# Patient Record
Sex: Male | Born: 1935
Health system: Southern US, Community
[De-identification: ages and names within clinical notes are randomized; demographics above are authoritative.]

## PROBLEM LIST (undated history)

## (undated) DIAGNOSIS — K219 Gastro-esophageal reflux disease without esophagitis: Secondary | ICD-10-CM

## (undated) DIAGNOSIS — E785 Hyperlipidemia, unspecified: Secondary | ICD-10-CM

## (undated) DIAGNOSIS — B029 Zoster without complications: Secondary | ICD-10-CM

## (undated) DIAGNOSIS — I639 Cerebral infarction, unspecified: Secondary | ICD-10-CM

## (undated) DIAGNOSIS — M1990 Unspecified osteoarthritis, unspecified site: Secondary | ICD-10-CM

## (undated) DIAGNOSIS — J449 Chronic obstructive pulmonary disease, unspecified: Secondary | ICD-10-CM

## (undated) DIAGNOSIS — E119 Type 2 diabetes mellitus without complications: Secondary | ICD-10-CM

## (undated) DIAGNOSIS — Z8719 Personal history of other diseases of the digestive system: Secondary | ICD-10-CM

## (undated) DIAGNOSIS — R0602 Shortness of breath: Secondary | ICD-10-CM

## (undated) DIAGNOSIS — I2510 Atherosclerotic heart disease of native coronary artery without angina pectoris: Secondary | ICD-10-CM

## (undated) DIAGNOSIS — D126 Benign neoplasm of colon, unspecified: Secondary | ICD-10-CM

## (undated) DIAGNOSIS — R51 Headache: Secondary | ICD-10-CM

## (undated) DIAGNOSIS — F419 Anxiety disorder, unspecified: Secondary | ICD-10-CM

## (undated) DIAGNOSIS — D472 Monoclonal gammopathy: Principal | ICD-10-CM

## (undated) DIAGNOSIS — I10 Essential (primary) hypertension: Secondary | ICD-10-CM

## (undated) DIAGNOSIS — G709 Myoneural disorder, unspecified: Secondary | ICD-10-CM

## (undated) DIAGNOSIS — I6529 Occlusion and stenosis of unspecified carotid artery: Secondary | ICD-10-CM

## (undated) DIAGNOSIS — Z87442 Personal history of urinary calculi: Secondary | ICD-10-CM

## (undated) DIAGNOSIS — C801 Malignant (primary) neoplasm, unspecified: Secondary | ICD-10-CM

## (undated) HISTORY — DX: Essential (primary) hypertension: I10

## (undated) HISTORY — DX: Unspecified osteoarthritis, unspecified site: M19.90

## (undated) HISTORY — DX: Personal history of urinary calculi: Z87.442

## (undated) HISTORY — DX: Occlusion and stenosis of unspecified carotid artery: I65.29

## (undated) HISTORY — DX: Type 2 diabetes mellitus without complications: E11.9

## (undated) HISTORY — DX: Cerebral infarction, unspecified: I63.9

## (undated) HISTORY — DX: Hyperlipidemia, unspecified: E78.5

## (undated) HISTORY — PX: CAROTID ENDARTERECTOMY: SUR193

## (undated) HISTORY — DX: Chronic obstructive pulmonary disease, unspecified: J44.9

## (undated) HISTORY — DX: Atherosclerotic heart disease of native coronary artery without angina pectoris: I25.10

## (undated) HISTORY — DX: Malignant (primary) neoplasm, unspecified: C80.1

## (undated) HISTORY — DX: Monoclonal gammopathy: D47.2

## (undated) HISTORY — DX: Zoster without complications: B02.9

## (undated) HISTORY — DX: Benign neoplasm of colon, unspecified: D12.6

## (undated) HISTORY — DX: Gastro-esophageal reflux disease without esophagitis: K21.9

---
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
## 1996-01-29 HISTORY — PX: CORONARY ARTERY BYPASS GRAFT: SHX141

## 2005-05-27 ENCOUNTER — Ambulatory Visit (HOSPITAL_COMMUNITY): Admission: RE | Admit: 2005-05-27 | Discharge: 2005-05-27 | Payer: Self-pay | Admitting: Neurosurgery

## 2005-11-21 ENCOUNTER — Encounter: Payer: Self-pay | Admitting: Gastroenterology

## 2006-12-21 ENCOUNTER — Emergency Department (HOSPITAL_COMMUNITY): Admission: EM | Admit: 2006-12-21 | Discharge: 2006-12-21 | Payer: Self-pay | Admitting: Emergency Medicine

## 2008-04-28 HISTORY — PX: OTHER SURGICAL HISTORY: SHX169

## 2008-04-28 HISTORY — PX: CORONARY ANGIOPLASTY: SHX604

## 2008-06-09 ENCOUNTER — Ambulatory Visit: Payer: Self-pay | Admitting: *Deleted

## 2008-06-10 ENCOUNTER — Encounter: Payer: Self-pay | Admitting: Pulmonary Disease

## 2008-06-14 ENCOUNTER — Ambulatory Visit (HOSPITAL_COMMUNITY): Admission: RE | Admit: 2008-06-14 | Discharge: 2008-06-14 | Payer: Self-pay | Admitting: Cardiovascular Disease

## 2008-06-20 ENCOUNTER — Ambulatory Visit: Payer: Self-pay | Admitting: *Deleted

## 2008-06-20 ENCOUNTER — Ambulatory Visit (HOSPITAL_COMMUNITY): Admission: RE | Admit: 2008-06-20 | Discharge: 2008-06-20 | Payer: Self-pay | Admitting: *Deleted

## 2008-06-29 ENCOUNTER — Encounter: Payer: Self-pay | Admitting: Internal Medicine

## 2008-07-07 ENCOUNTER — Ambulatory Visit: Payer: Self-pay | Admitting: *Deleted

## 2008-07-11 ENCOUNTER — Encounter: Payer: Self-pay | Admitting: Pulmonary Disease

## 2008-09-06 ENCOUNTER — Ambulatory Visit: Payer: Self-pay | Admitting: Internal Medicine

## 2008-09-06 DIAGNOSIS — Z860100 Personal history of colon polyps, unspecified: Secondary | ICD-10-CM | POA: Insufficient documentation

## 2008-09-06 DIAGNOSIS — J449 Chronic obstructive pulmonary disease, unspecified: Secondary | ICD-10-CM | POA: Insufficient documentation

## 2008-09-06 DIAGNOSIS — Z87442 Personal history of urinary calculi: Secondary | ICD-10-CM | POA: Insufficient documentation

## 2008-09-06 DIAGNOSIS — K219 Gastro-esophageal reflux disease without esophagitis: Secondary | ICD-10-CM | POA: Insufficient documentation

## 2008-09-06 DIAGNOSIS — M1990 Unspecified osteoarthritis, unspecified site: Secondary | ICD-10-CM | POA: Insufficient documentation

## 2008-09-12 ENCOUNTER — Encounter: Payer: Self-pay | Admitting: Pulmonary Disease

## 2008-10-10 ENCOUNTER — Encounter: Payer: Self-pay | Admitting: Pulmonary Disease

## 2008-10-10 ENCOUNTER — Encounter: Payer: Self-pay | Admitting: Internal Medicine

## 2008-11-02 ENCOUNTER — Encounter: Payer: Self-pay | Admitting: Internal Medicine

## 2009-01-05 ENCOUNTER — Ambulatory Visit: Payer: Self-pay | Admitting: Internal Medicine

## 2009-01-06 ENCOUNTER — Other Ambulatory Visit: Admission: RE | Admit: 2009-01-06 | Discharge: 2009-01-06 | Payer: Self-pay | Admitting: Otolaryngology

## 2009-01-13 ENCOUNTER — Ambulatory Visit: Payer: Self-pay | Admitting: Internal Medicine

## 2009-01-16 ENCOUNTER — Encounter (INDEPENDENT_AMBULATORY_CARE_PROVIDER_SITE_OTHER): Payer: Self-pay | Admitting: *Deleted

## 2009-01-23 ENCOUNTER — Telehealth: Payer: Self-pay | Admitting: Internal Medicine

## 2009-01-30 ENCOUNTER — Ambulatory Visit: Payer: Self-pay | Admitting: Cardiology

## 2009-01-30 DIAGNOSIS — I6529 Occlusion and stenosis of unspecified carotid artery: Secondary | ICD-10-CM | POA: Insufficient documentation

## 2009-02-01 ENCOUNTER — Ambulatory Visit: Payer: Self-pay | Admitting: Gastroenterology

## 2009-02-01 ENCOUNTER — Encounter (INDEPENDENT_AMBULATORY_CARE_PROVIDER_SITE_OTHER): Payer: Self-pay | Admitting: *Deleted

## 2009-02-01 DIAGNOSIS — K5900 Constipation, unspecified: Secondary | ICD-10-CM | POA: Insufficient documentation

## 2009-02-08 ENCOUNTER — Ambulatory Visit: Payer: Self-pay | Admitting: Vascular Surgery

## 2009-02-10 ENCOUNTER — Ambulatory Visit: Payer: Self-pay | Admitting: Cardiology

## 2009-02-10 LAB — CONVERTED CEMR LAB
ALT: 27 units/L (ref 0–53)
BUN: 22 mg/dL (ref 6–23)
Bilirubin, Direct: 0.2 mg/dL (ref 0.0–0.3)
Chloride: 102 meq/L (ref 96–112)
Creatinine, Ser: 1.02 mg/dL (ref 0.40–1.50)
Indirect Bilirubin: 0.6 mg/dL (ref 0.0–0.9)
Total Bilirubin: 0.8 mg/dL (ref 0.3–1.2)

## 2009-02-14 LAB — CONVERTED CEMR LAB
BUN: 23 mg/dL (ref 6–23)
Chloride: 100 meq/L (ref 96–112)
Glucose, Bld: 89 mg/dL (ref 70–99)
Phosphorus: 4.1 mg/dL (ref 2.3–4.6)
Potassium: 4.3 meq/L (ref 3.5–5.3)
Sodium: 140 meq/L (ref 135–145)

## 2009-02-15 ENCOUNTER — Ambulatory Visit: Payer: Self-pay | Admitting: Cardiology

## 2009-02-15 ENCOUNTER — Ambulatory Visit: Payer: Self-pay | Admitting: Pulmonary Disease

## 2009-02-17 LAB — CONVERTED CEMR LAB
Cholesterol: 171 mg/dL (ref 0–200)
LDL Cholesterol: 98 mg/dL (ref 0–99)
Triglycerides: 116 mg/dL (ref 0.0–149.0)

## 2009-02-20 LAB — CONVERTED CEMR LAB: TSH: 0.96 microintl units/mL (ref 0.35–5.50)

## 2009-02-22 LAB — CONVERTED CEMR LAB
BUN: 13 mg/dL (ref 6–23)
Chloride: 106 meq/L (ref 96–112)
Creatinine, Ser: 1.1 mg/dL (ref 0.4–1.5)
Glucose, Bld: 106 mg/dL — ABNORMAL HIGH (ref 70–99)
Potassium: 3.8 meq/L (ref 3.5–5.1)

## 2009-03-03 ENCOUNTER — Telehealth: Payer: Self-pay | Admitting: Internal Medicine

## 2009-03-17 ENCOUNTER — Ambulatory Visit: Payer: Self-pay | Admitting: Internal Medicine

## 2009-03-17 ENCOUNTER — Telehealth: Payer: Self-pay | Admitting: Internal Medicine

## 2009-04-06 ENCOUNTER — Ambulatory Visit: Payer: Self-pay | Admitting: Gastroenterology

## 2009-04-06 LAB — HM COLONOSCOPY

## 2009-04-07 ENCOUNTER — Emergency Department (HOSPITAL_COMMUNITY): Admission: EM | Admit: 2009-04-07 | Discharge: 2009-04-07 | Payer: Self-pay | Admitting: Emergency Medicine

## 2009-04-07 ENCOUNTER — Telehealth: Payer: Self-pay | Admitting: Endocrinology

## 2009-04-10 ENCOUNTER — Ambulatory Visit: Payer: Self-pay | Admitting: Internal Medicine

## 2009-04-10 LAB — CONVERTED CEMR LAB
Bilirubin Urine: NEGATIVE
Blood in Urine, dipstick: NEGATIVE
Nitrite: NEGATIVE
Specific Gravity, Urine: 1.02
WBC Urine, dipstick: NEGATIVE

## 2009-04-11 ENCOUNTER — Encounter: Payer: Self-pay | Admitting: Gastroenterology

## 2009-04-11 ENCOUNTER — Encounter: Payer: Self-pay | Admitting: Internal Medicine

## 2009-04-12 ENCOUNTER — Telehealth: Payer: Self-pay | Admitting: Internal Medicine

## 2009-04-13 ENCOUNTER — Ambulatory Visit: Payer: Self-pay | Admitting: Internal Medicine

## 2009-04-13 DIAGNOSIS — M549 Dorsalgia, unspecified: Secondary | ICD-10-CM | POA: Insufficient documentation

## 2009-04-14 ENCOUNTER — Telehealth: Payer: Self-pay | Admitting: Internal Medicine

## 2009-04-27 ENCOUNTER — Ambulatory Visit: Payer: Self-pay | Admitting: Internal Medicine

## 2009-04-27 DIAGNOSIS — B0229 Other postherpetic nervous system involvement: Secondary | ICD-10-CM | POA: Insufficient documentation

## 2009-05-31 ENCOUNTER — Ambulatory Visit: Payer: Self-pay | Admitting: Internal Medicine

## 2009-05-31 DIAGNOSIS — N138 Other obstructive and reflux uropathy: Secondary | ICD-10-CM | POA: Insufficient documentation

## 2009-05-31 DIAGNOSIS — N401 Enlarged prostate with lower urinary tract symptoms: Secondary | ICD-10-CM | POA: Insufficient documentation

## 2009-05-31 LAB — CONVERTED CEMR LAB
BUN: 13 mg/dL (ref 6–23)
Basophils Absolute: 0.1 10*3/uL (ref 0.0–0.1)
Basophils Relative: 0.6 % (ref 0.0–3.0)
Bilirubin Urine: NEGATIVE
CO2: 28 meq/L (ref 19–32)
Calcium: 10.3 mg/dL (ref 8.4–10.5)
Chloride: 107 meq/L (ref 96–112)
Creatinine, Ser: 1 mg/dL (ref 0.4–1.5)
Eosinophils Absolute: 0.3 10*3/uL (ref 0.0–0.7)
Glucose, Bld: 112 mg/dL — ABNORMAL HIGH (ref 70–99)
Hemoglobin, Urine: NEGATIVE
Lymphocytes Relative: 25.1 % (ref 12.0–46.0)
MCHC: 34.7 g/dL (ref 30.0–36.0)
MCV: 93.1 fL (ref 78.0–100.0)
Monocytes Absolute: 0.8 10*3/uL (ref 0.1–1.0)
Neutrophils Relative %: 62.4 % (ref 43.0–77.0)
Nitrite: NEGATIVE
Platelets: 228 10*3/uL (ref 150.0–400.0)
RBC: 4.52 M/uL (ref 4.22–5.81)
RDW: 14.4 % (ref 11.5–14.6)
Urine Glucose: NEGATIVE mg/dL
Urobilinogen, UA: 0.2 (ref 0.0–1.0)

## 2009-06-01 ENCOUNTER — Encounter: Payer: Self-pay | Admitting: Internal Medicine

## 2009-06-06 ENCOUNTER — Ambulatory Visit: Payer: Self-pay | Admitting: Cardiology

## 2009-06-06 DIAGNOSIS — E785 Hyperlipidemia, unspecified: Secondary | ICD-10-CM | POA: Insufficient documentation

## 2009-06-27 ENCOUNTER — Ambulatory Visit (HOSPITAL_COMMUNITY): Admission: RE | Admit: 2009-06-27 | Discharge: 2009-06-27 | Payer: Self-pay | Admitting: Cardiology

## 2009-06-27 ENCOUNTER — Ambulatory Visit: Payer: Self-pay

## 2009-06-27 ENCOUNTER — Ambulatory Visit: Payer: Self-pay | Admitting: Cardiology

## 2009-06-27 ENCOUNTER — Encounter: Payer: Self-pay | Admitting: Cardiology

## 2009-07-03 ENCOUNTER — Telehealth: Payer: Self-pay | Admitting: Cardiology

## 2009-07-03 LAB — CONVERTED CEMR LAB
Albumin: 4.1 g/dL (ref 3.5–5.2)
Cholesterol: 150 mg/dL (ref 0–200)
LDL Cholesterol: 82 mg/dL (ref 0–99)
Total CHOL/HDL Ratio: 3
Total Protein: 7.6 g/dL (ref 6.0–8.3)
Triglycerides: 76 mg/dL (ref 0.0–149.0)
VLDL: 15.2 mg/dL (ref 0.0–40.0)

## 2009-07-05 ENCOUNTER — Ambulatory Visit: Payer: Self-pay | Admitting: Internal Medicine

## 2009-07-05 DIAGNOSIS — I10 Essential (primary) hypertension: Secondary | ICD-10-CM | POA: Insufficient documentation

## 2009-07-05 LAB — CONVERTED CEMR LAB
HDL goal, serum: 40 mg/dL
LDL Goal: 100 mg/dL

## 2009-07-14 ENCOUNTER — Telehealth: Payer: Self-pay | Admitting: Cardiology

## 2009-08-30 ENCOUNTER — Telehealth: Payer: Self-pay | Admitting: Internal Medicine

## 2009-09-06 ENCOUNTER — Ambulatory Visit: Payer: Self-pay | Admitting: Internal Medicine

## 2009-09-11 ENCOUNTER — Ambulatory Visit: Payer: Self-pay | Admitting: Cardiology

## 2009-09-11 ENCOUNTER — Telehealth: Payer: Self-pay | Admitting: Internal Medicine

## 2009-09-14 ENCOUNTER — Ambulatory Visit: Payer: Self-pay | Admitting: Vascular Surgery

## 2009-09-14 ENCOUNTER — Encounter: Payer: Self-pay | Admitting: Internal Medicine

## 2009-09-18 LAB — CONVERTED CEMR LAB
ALT: 27 units/L (ref 0–53)
AST: 24 units/L (ref 0–37)
Alkaline Phosphatase: 52 units/L (ref 39–117)
Bilirubin, Direct: 0.1 mg/dL (ref 0.0–0.3)
Cholesterol: 123 mg/dL (ref 0–200)
Total Bilirubin: 0.8 mg/dL (ref 0.3–1.2)

## 2009-11-02 ENCOUNTER — Ambulatory Visit: Payer: Self-pay | Admitting: Internal Medicine

## 2010-01-04 ENCOUNTER — Encounter: Payer: Self-pay | Admitting: Internal Medicine

## 2010-01-04 ENCOUNTER — Ambulatory Visit: Payer: Self-pay | Admitting: Internal Medicine

## 2010-01-04 DIAGNOSIS — R220 Localized swelling, mass and lump, head: Secondary | ICD-10-CM | POA: Insufficient documentation

## 2010-01-04 DIAGNOSIS — R221 Localized swelling, mass and lump, neck: Secondary | ICD-10-CM

## 2010-01-04 DIAGNOSIS — M542 Cervicalgia: Secondary | ICD-10-CM | POA: Insufficient documentation

## 2010-01-04 LAB — CONVERTED CEMR LAB
Basophils Relative: 0.7 % (ref 0.0–3.0)
Chloride: 106 meq/L (ref 96–112)
Creatinine, Ser: 1 mg/dL (ref 0.4–1.5)
Eosinophils Relative: 4.2 % (ref 0.0–5.0)
HCT: 43.9 % (ref 39.0–52.0)
Hemoglobin: 15.1 g/dL (ref 13.0–17.0)
LDL Cholesterol: 93 mg/dL (ref 0–99)
MCV: 91.7 fL (ref 78.0–100.0)
Monocytes Absolute: 0.9 10*3/uL (ref 0.1–1.0)
Neutrophils Relative %: 48.9 % (ref 43.0–77.0)
Potassium: 4.2 meq/L (ref 3.5–5.1)
RBC: 4.79 M/uL (ref 4.22–5.81)
Sodium: 140 meq/L (ref 135–145)
Total CHOL/HDL Ratio: 3
WBC: 8.4 10*3/uL (ref 4.5–10.5)

## 2010-01-12 ENCOUNTER — Ambulatory Visit: Payer: Self-pay | Admitting: Cardiology

## 2010-01-18 ENCOUNTER — Encounter: Payer: Self-pay | Admitting: Cardiovascular Disease

## 2010-01-23 ENCOUNTER — Ambulatory Visit: Payer: Self-pay

## 2010-01-23 ENCOUNTER — Encounter: Payer: Self-pay | Admitting: Cardiovascular Disease

## 2010-01-23 DIAGNOSIS — I739 Peripheral vascular disease, unspecified: Secondary | ICD-10-CM | POA: Insufficient documentation

## 2010-02-14 ENCOUNTER — Telehealth: Payer: Self-pay | Admitting: Internal Medicine

## 2010-02-27 NOTE — Progress Notes (Signed)
Summary: FYI  Phone Note Outgoing Call   Summary of Call: I spoke with pt per MD and suggested he went to the hospital if he thinks he is having possible kidney stones. Pt informed me he has had kidney stones before and was going to go straight to the hospital. Initial call taken by: Josph Macho RMA,  April 07, 2009 9:21 AM

## 2010-02-27 NOTE — Assessment & Plan Note (Signed)
Summary: 2 MO FU/PN   Vital Signs:  Patient profile:   77 year old male Height:      72 inches Weight:      217 pounds BMI:     29.54 O2 Sat:      95 % on Room air Temp:     98.0 degrees F oral Pulse rate:   67 / minute Pulse rhythm:   regular Resp:     16 per minute BP sitting:   126 / 77  (left arm) Cuff size:   large  Vitals Entered By: [name] CMA (September 06, 2009 1:16 PM)  Nutrition Counseling: Patient's BMI is greater than 25 and therefore counseled on weight management options.  O2 Flow:  Room air CC: follow-up visit, Lipid Management Is Patient Diabetic? No Pain Assessment Patient in pain? no       Does patient need assistance? Functional Status Self care Ambulation Normal   Primary Care Marialice Newkirk:  [name] MD  CC:  follow-up visit and Lipid Management.  History of Present Illness:  Follow-Up Visit      This is a 77 year old man who presents for Follow-up visit.  The patient denies chest pain, palpitations, dizziness, syncope, edema, SOB, DOE, PND, and orthopnea.  Since the last visit the patient notes no new problems or concerns.  The patient reports taking meds as prescribed, monitoring BP, and dietary compliance.  When questioned about possible medication side effects, the patient notes none.    Lipid Management History:      Positive NCEP/ATP III risk factors include male age 28 years old or older, hypertension, and ASHD (either angina/prior MI/prior CABG).  Negative NCEP/ATP III risk factors include non-diabetic, no family history for ischemic heart disease, non-tobacco-user status, no prior stroke/TIA, no peripheral vascular disease, and no history of aortic aneurysm.        The patient states that he knows about the "Therapeutic Lifestyle Change" diet.  His compliance with the TLC diet is good.  He expresses no side effects from his lipid-lowering medication.  The patient denies any symptoms to suggest myopathy or liver disease.     Preventive Screening-Counseling & Management  Alcohol-Tobacco     Alcohol drinks/day: 0     Smoking Status: quit     Year Started: 1956     Year Quit: 1995     Pack years: 40     Tobacco Counseling: to remain off tobacco products  Hep-HIV-STD-Contraception     Hepatitis Risk: no risk noted     HIV Risk: no risk noted     STD Risk: no risk noted      Sexual History:  not active.        Drug Use:  never.        Blood Transfusions:  no.    Clinical Review Panels:  Lipid Management   Cholesterol:  150 (06/27/2009)   LDL (bad choesterol):  82 (06/27/2009)   HDL (good cholesterol):  53.30 (06/27/2009)  Diabetes Management   Creatinine:  1.0 (05/31/2009)   Last Flu Vaccine:  given (09/28/2008)   Last Pneumovax:  given (09/28/2008)  CBC   WBC:  9.5 (05/31/2009)   RBC:  4.52 (05/31/2009)   Hgb:  14.6 (05/31/2009)   Hct:  42.1 (05/31/2009)   Platelets:  228.0 (05/31/2009)   MCV  93.1 (05/31/2009)   MCHC  34.7 (05/31/2009)   RDW  14.4 (05/31/2009)   PMN:  62.4 (05/31/2009)   Lymphs:  25.1 (  05/31/2009)   Monos:  8.8 (05/31/2009)   Eosinophils:  3.1 (05/31/2009)   Basophil:  0.6 (05/31/2009)  Complete Metabolic Panel   Glucose:  112 (05/31/2009)   Sodium:  141 (05/31/2009)   Potassium:  3.9 (05/31/2009)   Chloride:  107 (05/31/2009)   CO2:  28 (05/31/2009)   BUN:  13 (05/31/2009)   Creatinine:  1.0 (05/31/2009)   Albumin:  4.1 (06/27/2009)   Total Protein:  7.6 (06/27/2009)   Calcium:  10.3 (05/31/2009)   Total Bili:  0.4 (06/27/2009)   Alk Phos:  47 (06/27/2009)   SGPT (ALT):  22 (06/27/2009)   SGOT (AST):  20 (06/27/2009)   Medications Prior to Update: 1)  Crestor 20 Mg Tabs (Rosuvastatin Calcium) .... One Daily 2)  Zolpidem Tartrate 10 Mg Tabs (Zolpidem Tartrate) .Marland Kitchen.. 1 At Bedtime As Needed 3)  Nasonex 50 Mcg/act Susp (Mometasone Furoate) .Marland Kitchen.. 1 To 2 Sprays Each Nostril Daily 4)  Ecotrin Low Strength 81 Mg Tbec (Aspirin) .... Take 3 Tablet By Mouth Once  A Day 5)  Acid Reducer 75 Mg Tabs (Ranitidine Hcl) .Marland Kitchen.. 1 Once Daily 6)  Advair Diskus 250-50 Mcg/dose Aepb (Fluticasone-Salmeterol) .... One Puff Two Times A Day 7)  Sinus Rinse Bottle Kit  Pack (Hypertonic Nasal Wash) .... As Needed 8)  Tramadol-Acetaminophen 37.5-325 Mg Tabs (Tramadol-Acetaminophen) .... As Needed 9)  Bystolic 5 Mg Tabs (Nebivolol Hcl) .... One By Mouth Once Daily For High Blood Pressure 10)  Lyrica 75 Mg Caps (Pregabalin) .... One By Mouth Three Times A Day As Needed For Nerve Pain  Current Medications (verified): 1)  Crestor 20 Mg Tabs (Rosuvastatin Calcium) .... One Daily 2)  Zolpidem Tartrate 10 Mg Tabs (Zolpidem Tartrate) .Marland Kitchen.. 1 At Bedtime As Needed 3)  Nasonex 50 Mcg/act Susp (Mometasone Furoate) .Marland Kitchen.. 1 To 2 Sprays Each Nostril Daily 4)  Ecotrin Low Strength 81 Mg Tbec (Aspirin) .... Take 3 Tablet By Mouth Once A Day 5)  Acid Reducer 75 Mg Tabs (Ranitidine Hcl) .Marland Kitchen.. 1 Once Daily 6)  Advair Diskus 250-50 Mcg/dose Aepb (Fluticasone-Salmeterol) .... One Puff Two Times A Day 7)  Sinus Rinse Bottle Kit  Pack (Hypertonic Nasal Wash) .... As Needed 8)  Tramadol-Acetaminophen 37.5-325 Mg Tabs (Tramadol-Acetaminophen) .... As Needed 9)  Bystolic 5 Mg Tabs (Nebivolol Hcl) .... One By Mouth Once Daily For High Blood Pressure 10)  Lyrica 75 Mg Caps (Pregabalin) .... One By Mouth Three Times A Day As Needed For Nerve Pain  Allergies (verified): No Known Drug Allergies  Past History:  Past Medical History: Last updated: 06/06/2009 1. CAD, ARTERY BYPASS GRAFT/ 1988 (ICD-414.04): s/p CABG in [facility] in 1989.  Had myoview in 5/10 showing EF 50% and apical ischemia.  LHC was done in 5/10 showing 90% pLAD, 90% mLAD, 60% mCFX, 70% pRCA, 70% mRCA, 95% dRCA, SVG-PLV patent, LIMA-LAD patent, no other grafts found by aortic root shot.  Free radial-D presumed occluded.  2. COPD (ICD-496): PFTs 9/10 with FVC 65%, FEV1 57%.  On Advair.  3. OSTEOARTHRITIS (ICD-715.90) 4.  NEPHROLITHIASIS, HX OF (ICD-V13.01) 5. GERD (ICD-530.81) 6. Tubulovillous adenomatous colon polyps with HGD 1999 7. Hyperlipidemia 8. Carotid stenosis: Followed by Dr. Madilyn Fireman.  Angiogram 5/10 showed left common carotid to be totally occluded and 50% RICA stenosis.  9.  HTN: ACEI cough 10.  CVA  11.  Shingles    Past Surgical History: Last updated: 02/01/2009 Coronary artery bypass graft 1989 Tonsillectomy 1941 cardiac catheterization April 2010 carotid artery angiogram April 2010  Family  History: Last updated: 02/01/2009 father died of a self-inflicted wound age 37, history of EtOH mother died age 47 history of EtOH One sister is well No FH of Colon Cancer:  Social History: Last updated: 02/01/2009 Retired Single former [employer] in [place] Regular exercise-yes Alcohol use-no Quit smoking around 20 years ago.  Alcohol Use - no Daily Caffeine Use: 2-3 cups weekly   Risk Factors: Alcohol Use: 0 (09/06/2009) Exercise: yes (09/06/2008)  Risk Factors: Smoking Status: quit (09/06/2009)  Family History: Reviewed history from 02/01/2009 and no changes required. father died of a self-inflicted wound age 48, history of EtOH mother died age 63 history of EtOH One sister is well No FH of Colon Cancer:  Social History: Reviewed history from 02/01/2009 and no changes required. 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  Alcohol Use - no Daily Caffeine Use: 2-3 cups weekly   Review of Systems  The patient denies anorexia, fever, weight loss, weight gain, chest pain, peripheral edema, prolonged cough, hemoptysis, abdominal pain, suspicious skin lesions, difficulty walking, depression, enlarged lymph nodes, and angioedema.    Physical Exam  General:  alert, well-developed, well-nourished, well-hydrated, appropriate dress, normal appearance,  healthy-appearing, cooperative to examination, good hygiene, and overweight-appearing.   Head:  normocephalic and atraumatic.   Mouth:  Oral mucosa and oropharynx without lesions or exudates.  Teeth in good repair. Neck:  supple, full ROM, no masses, no thyromegaly, no JVD, normal carotid upstroke, no carotid bruits, and no cervical lymphadenopathy.   Lungs:  normal respiratory effort, no intercostal retractions, no accessory muscle use, normal breath sounds, no dullness, no fremitus, and no crackles.   Heart:  normal rate, regular rhythm, no murmur, no gallop, no rub, and no JVD.   Abdomen:  soft, non-tender, normal bowel sounds, no distention, no masses, no guarding, no rigidity, and no rebound tenderness.   Msk:  normal ROM, no joint tenderness, no joint swelling, no joint warmth, no redness over joints, no joint deformities, no joint instability, no crepitation, and no muscle atrophy.   Pulses:  R and L carotid,radial,femoral,dorsalis pedis and posterior tibial pulses are full and equal bilaterally Extremities:  No clubbing, cyanosis, edema, or deformity noted with normal full range of motion of all joints.   Neurologic:  No cranial nerve deficits noted. Station and gait are normal. Plantar reflexes are down-going bilaterally. DTRs are symmetrical throughout. Sensory, motor and coordinative functions appear intact. Skin:  turgor normal, color normal, no rashes, no suspicious lesions, no ecchymoses, no petechiae, no purpura, no ulcerations, and no edema.   Cervical Nodes:  no anterior cervical adenopathy and no posterior cervical adenopathy.   Psych:  Cognition and judgment appear intact. Alert and cooperative with normal attention span and concentration. No apparent delusions, illusions, hallucinations   Impression & Recommendations:  Problem # 1:  HYPERTENSION, BENIGN ESSENTIAL (ICD-401.1) Assessment Improved  His updated medication list for this problem includes:    Bystolic 5 Mg Tabs  (Nebivolol hcl) ..... One by mouth once daily for high blood pressure  BP today: 126/77 Prior BP: 152/82 (07/05/2009)  Prior 10 Yr Risk Heart Disease: N/A (07/05/2009)  Labs Reviewed: K+: 3.9 (05/31/2009) Creat: : 1.0 (05/31/2009)   Chol: 150 (06/27/2009)   HDL: 53.30 (06/27/2009)   LDL: 82 (06/27/2009)   TG: 76.0 (06/27/2009)  Problem # 2:  HYPERLIPIDEMIA-MIXED (ICD-272.4) Assessment: Unchanged  His updated medication list for this problem  includes:    Crestor 20 Mg Tabs (Rosuvastatin calcium) ..... One daily  Labs Reviewed: SGOT: 20 (06/27/2009)   SGPT: 22 (06/27/2009)  Lipid Goals: Chol Goal: 200 (07/05/2009)   HDL Goal: 40 (07/05/2009)   LDL Goal: 100 (07/05/2009)   TG Goal: 150 (07/05/2009)  Prior 10 Yr Risk Heart Disease: N/A (07/05/2009)   HDL:53.30 (06/27/2009), 49.80 (02/10/2009)  LDL:82 (06/27/2009), 98 (02/10/2009)  Chol:150 (06/27/2009), 171 (02/10/2009)  Trig:76.0 (06/27/2009), 116.0 (02/10/2009)  Problem # 3:  POSTHERPETIC NEURALGIA (ICD-053.19) Assessment: Improved  Problem # 4:  COPD (ICD-496) Assessment: Unchanged  His updated medication list for this problem includes:    Advair Diskus 250-50 Mcg/dose Aepb (Fluticasone-salmeterol) ..... One puff two times a day  Pulmonary Functions Reviewed: O2 sat: 95 (09/06/2009)     Vaccines Reviewed: Pneumovax: given (09/28/2008)   Flu Vax: given (09/28/2008)  Complete Medication List: 1)  Crestor 20 Mg Tabs (Rosuvastatin calcium) .... One daily 2)  Zolpidem Tartrate 10 Mg Tabs (Zolpidem tartrate) .Marland Kitchen.. 1 at bedtime as needed 3)  Nasonex 50 Mcg/act Susp (Mometasone furoate) .Marland Kitchen.. 1 to 2 sprays each nostril daily 4)  Ecotrin Low Strength 81 Mg Tbec (Aspirin) .... Take 3 tablet by mouth once a day 5)  Acid Reducer 75 Mg Tabs (Ranitidine hcl) .Marland Kitchen.. 1 once daily 6)  Advair Diskus 250-50 Mcg/dose Aepb (Fluticasone-salmeterol) .... One puff two times a day 7)  Sinus Rinse Bottle Kit Pack (Hypertonic nasal wash) .... As  needed 8)  Tramadol-acetaminophen 37.5-325 Mg Tabs (Tramadol-acetaminophen) .... As needed 9)  Bystolic 5 Mg Tabs (Nebivolol hcl) .... One by mouth once daily for high blood pressure 10)  Lyrica 75 Mg Caps (Pregabalin) .... One by mouth three times a day as needed for nerve pain  Lipid Assessment/Plan:      Based on NCEP/ATP III, the patient's risk factor category is "history of coronary disease, peripheral vascular disease, cerebrovascular disease, or aortic aneurysm".  The patient's lipid goals are as follows: Total cholesterol goal is 200; LDL cholesterol goal is 100; HDL cholesterol goal is 40; Triglyceride goal is 150.     Patient Instructions: 1)  Please schedule a follow-up appointment in 6 months. 2)  It is important that you exercise regularly at least 20 minutes 5 times a week. If you develop chest pain, have severe difficulty breathing, or feel very tired , stop exercising immediately and seek medical attention. 3)  You need to lose weight. Consider a lower calorie diet and regular exercise.  4)  Check your Blood Pressure regularly. If it is above 130/80: you should make an appointment.

## 2010-02-27 NOTE — Progress Notes (Signed)
Summary: spots  Phone Note Call from Patient Call back at Home Phone [phone]   Summary of Call: Patient left message on triage to notify MD that he now has red spots parallel to his navel, and there is about 8 of them. Please advise. Initial call taken by: Lucious Groves,  April 12, 2009 11:28 AM  Follow-up for Phone Call        sounds like shingles rx sent in Follow-up by: [name] MD,  April 12, 2009 11:42 AM  Additional Follow-up for Phone Call Additional follow up Details #1::        Patient notified and states that he had the shingles shot about 4 years ago. Should the patient still take the prescription sent in?  Patient also notes that he has a small stone,but it is not causing his issue, so he is being referred to [facility]. Do you think he should pursue this referral or is it the shingles? Please advise. Additional Follow-up by: Lucious Groves,  April 12, 2009 11:55 AM    Additional Follow-up for Phone Call Additional follow up Details #2::    i need to see it to answer ALL of these questions Follow-up by: [name] MD,  April 12, 2009 12:01 PM  Additional Follow-up for Phone Call Additional follow up Details #3:: Details for Additional Follow-up Action Taken: Patient notified and scheduled office visit for tomorrow. Additional Follow-up by: Lucious Groves,  April 12, 2009 1:11 PM  New/Updated Medications: ACYCLOVIR 800 MG TABS (ACYCLOVIR) One by mouth three times a day for 10 days Prescriptions: ACYCLOVIR 800 MG TABS (ACYCLOVIR) One by mouth three times a day for 10 days  #30 x 1   Entered and Authorized by:   [name] MD   Signed by:   [name] MD on 04/12/2009   Method used:   Electronically to        [id number]* (retail)       [address]       [place], [place]  [address]       Ph: [phone]       Fax: [phone]   RxID:   [id number]

## 2010-02-27 NOTE — Assessment & Plan Note (Signed)
Summary: 6 wk f/u / needs 30 min per Alfred Armstrong/#/cd   Vital Signs:  Patient profile:   77 year old male Height:      72 inches Weight:      210 pounds BMI:     28.58 O2 Sat:      96 % on Room air Temp:     97.8 degrees F oral Pulse rate:   64 / minute Pulse rhythm:   regular Resp:     16 per minute BP sitting:   152 / 82  (left arm) Cuff size:   large  Vitals Entered By: [name] CMA (July 05, 2009 1:20 PM)  O2 Flow:  Room air  Primary Care Provider:  [name] MD   History of Present Illness: He returns for f/up and says that he has persistent burning pain along his left lower chest wall, flank, and abd. but no more rash.  Lipid Management History:      Positive NCEP/ATP III risk factors include male age 30 years old or older, hypertension, and ASHD (either angina/prior MI/prior CABG).  Negative NCEP/ATP III risk factors include non-diabetic, no family history for ischemic heart disease, non-tobacco-user status, no prior stroke/TIA, no peripheral vascular disease, and no history of aortic aneurysm.        The patient states that he knows about the "Therapeutic Lifestyle Change" diet.  His compliance with the TLC diet is good.  The patient expresses understanding of adjunctive measures for cholesterol lowering.  Adjunctive measures started by the patient include aerobic exercise, fiber, ASA, niacin, omega-3 supplements, limit alcohol consumpton, and weight reduction.  He expresses no side effects from his lipid-lowering medication.  The patient denies any symptoms to suggest myopathy or liver disease.     Preventive Screening-Counseling & Management  Alcohol-Tobacco     Alcohol drinks/day: 0     Smoking Status: quit     Year Started: 1956     Year Quit: 1995     Pack years: 40  Allergies: No Known Drug Allergies  Past History:  Past Medical History: Last updated: 06/06/2009 1. CAD, ARTERY BYPASS GRAFT/ 1988 (ICD-414.04): [facility].   Had myoview in 5/10 showing EF 50% and apical ischemia.  LHC was done in 5/10 showing 90% pLAD, 90% mLAD, 60% mCFX, 70% pRCA, 70% mRCA, 95% dRCA, SVG-PLV patent, LIMA-LAD patent, no other grafts found by aortic root shot.  Free radial-D presumed occluded.  2. COPD (ICD-496): PFTs 9/10 with FVC 65%, FEV1 57%.  On Advair.  3. OSTEOARTHRITIS (ICD-715.90) 4. NEPHROLITHIASIS, HX OF (ICD-V13.01) 5. GERD (ICD-530.81) 6. Tubulovillous adenomatous colon polyps with HGD 1999 7. Hyperlipidemia 8. Carotid stenosis: Followed by Dr. Madilyn Fireman.  Angiogram 5/10 showed left common carotid to be totally occluded and 50% RICA stenosis.  9.  HTN: ACEI cough 10.  CVA  11.  Shingles    Past Surgical History: Last updated: Mar 02, 2009 Coronary artery bypass graft 1989 Tonsillectomy 1941 cardiac catheterization April 2010 carotid artery angiogram April 2010  Family History: Last updated: 2009/03/02 father died of a self-inflicted wound age 13, history of EtOH mother died age 66 history of EtOH One sister is well No FH of Colon Cancer:  Social History: Last updated: March 02, 2009 Retired Single former [employer] in [place] Regular exercise-yes Alcohol use-no Quit smoking around 20 years ago.  Alcohol Use - no Daily Caffeine Use: 2-3 cups weekly   Risk Factors: Alcohol Use: 0 (07/05/2009) Exercise:  yes (09/06/2008)  Risk Factors: Smoking Status: quit (07/05/2009)  Family History: Reviewed history from 02/01/2009 and no changes required. father died of a self-inflicted wound age 72, history of EtOH mother died age 12 history of EtOH One sister is well No FH of Colon Cancer:  Social History: Reviewed history from 02/01/2009 and no changes required. 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  Alcohol Use - no Daily Caffeine Use: 2-3 cups weekly   Review of Systems  The  patient denies anorexia, fever, weight loss, weight gain, hoarseness, chest pain, syncope, dyspnea on exertion, peripheral edema, prolonged cough, headaches, hemoptysis, abdominal pain, hematuria, suspicious skin lesions, enlarged lymph nodes, and angioedema.   General:  Denies chills, fatigue, fever, loss of appetite, malaise, sweats, and weight loss. Resp:  Denies chest pain with inspiration, cough, coughing up blood, morning headaches, pleuritic, shortness of breath, sputum productive, and wheezing. Derm:  Denies changes in color of skin, changes in nail beds, dryness, flushing, itching, lesion(s), poor wound healing, and rash.  Physical Exam  General:  alert, well-developed, well-nourished, well-hydrated, appropriate dress, normal appearance, healthy-appearing, cooperative to examination, good hygiene, and overweight-appearing.   Head:  normocephalic and atraumatic.   Mouth:  Oral mucosa and oropharynx without lesions or exudates.  Teeth in good repair. Neck:  supple, full ROM, no masses, no thyromegaly, no JVD, normal carotid upstroke, no carotid bruits, and no cervical lymphadenopathy.   Lungs:  normal respiratory effort, no intercostal retractions, no accessory muscle use, normal breath sounds, no dullness, no fremitus, and no crackles.   Heart:  normal rate, regular rhythm, no murmur, no gallop, no rub, and no JVD.   Abdomen:  soft, non-tender, normal bowel sounds, no distention, no masses, no guarding, no rigidity, and no rebound tenderness.   Msk:  normal ROM, no joint tenderness, no joint swelling, no joint warmth, no redness over joints, no joint deformities, no joint instability, no crepitation, and no muscle atrophy.   Pulses:  R and L carotid,radial,femoral,dorsalis pedis and posterior tibial pulses are full and equal bilaterally Extremities:  No clubbing, cyanosis, edema, or deformity noted with normal full range of motion of all joints.   Neurologic:  No cranial nerve deficits  noted. Station and gait are normal. Plantar reflexes are down-going bilaterally. DTRs are symmetrical throughout. Sensory, motor and coordinative functions appear intact. Skin:  turgor normal, color normal, no rashes, no suspicious lesions, no ecchymoses, no petechiae, no purpura, no ulcerations, and no edema.   Cervical Nodes:  no anterior cervical adenopathy and no posterior cervical adenopathy.   Axillary Nodes:  no R axillary adenopathy and no L axillary adenopathy.   Psych:  Cognition and judgment appear intact. Alert and cooperative with normal attention span and concentration. No apparent delusions, illusions, hallucinations   Impression & Recommendations:  Problem # 1:  HYPERTENSION, BENIGN ESSENTIAL (ICD-401.1) Assessment New  The following medications were removed from the medication list:    Atenolol 25 Mg Tabs (Atenolol) .Marland Kitchen... 1 once daily His updated medication list for this problem includes:    Bystolic 5 Mg Tabs (Nebivolol hcl) ..... One by mouth once daily for high blood pressure  BP today: 152/82 Prior BP: 168/90 (06/06/2009)  Labs Reviewed: K+: 3.9 (05/31/2009) Creat: : 1.0 (05/31/2009)   Chol: 150 (06/27/2009)   HDL: 53.30 (06/27/2009)   LDL: 82 (06/27/2009)   TG: 76.0 (06/27/2009)  Problem # 2:  POSTHERPETIC NEURALGIA (ICD-053.19) try  lyrica  Problem # 3:  HYPERLIPIDEMIA-MIXED (ICD-272.4) Assessment: Unchanged  His updated medication list for this problem includes:    Simvastatin 80 Mg Tabs (Simvastatin) .Marland Kitchen... Take 1 tablet at bedtime.  Labs Reviewed: SGOT: 20 (06/27/2009)   SGPT: 22 (06/27/2009)   HDL:53.30 (06/27/2009), 49.80 (02/10/2009)  LDL:82 (06/27/2009), 98 (02/10/2009)  Chol:150 (06/27/2009), 171 (02/10/2009)  Trig:76.0 (06/27/2009), 116.0 (02/10/2009)  Problem # 4:  COPD (ICD-496) Assessment: Unchanged  His updated medication list for this problem includes:    Advair Diskus 250-50 Mcg/dose Aepb (Fluticasone-salmeterol) ..... One puff two times a  day  Pulmonary Functions Reviewed: O2 sat: 96 (07/05/2009)     Vaccines Reviewed: Pneumovax: given (09/28/2008)   Flu Vax: given (09/28/2008)  Complete Medication List: 1)  Simvastatin 80 Mg Tabs (Simvastatin) .... Take 1 tablet at bedtime. 2)  Zolpidem Tartrate 10 Mg Tabs (Zolpidem tartrate) .Marland Kitchen.. 1 at bedtime as needed 3)  Nasonex 50 Mcg/act Susp (Mometasone furoate) .Marland Kitchen.. 1 to 2 sprays each nostril daily 4)  Ecotrin Low Strength 81 Mg Tbec (Aspirin) .... Take 3 tablet by mouth once a day 5)  Acid Reducer 75 Mg Tabs (Ranitidine hcl) .Marland Kitchen.. 1 once daily 6)  Advair Diskus 250-50 Mcg/dose Aepb (Fluticasone-salmeterol) .... One puff two times a day 7)  Sinus Rinse Bottle Kit Pack (Hypertonic nasal wash) .... As needed 8)  Tramadol-acetaminophen 37.5-325 Mg Tabs (Tramadol-acetaminophen) .... As needed 9)  Bystolic 5 Mg Tabs (Nebivolol hcl) .... One by mouth once daily for high blood pressure 10)  Lyrica 75 Mg Caps (Pregabalin) .... One by mouth three times a day as needed for nerve pain  Lipid Assessment/Plan:      Based on NCEP/ATP III, the patient's risk factor category is "history of coronary disease, peripheral vascular disease, cerebrovascular disease, or aortic aneurysm".  The patient's lipid goals are as follows: Total cholesterol goal is 200; LDL cholesterol goal is 100; HDL cholesterol goal is 40; Triglyceride goal is 150.    Patient Instructions: 1)  Please schedule a follow-up appointment in 2 months. 2)  It is important that you exercise regularly at least 20 minutes 5 times a week. If you develop chest pain, have severe difficulty breathing, or feel very tired , stop exercising immediately and seek medical attention. 3)  You need to lose weight. Consider a lower calorie diet and regular exercise.  4)  Check your Blood Pressure regularly. If it is above 130/80: you should make an appointment. Prescriptions: LYRICA 75 MG CAPS (PREGABALIN) One by mouth three times a day as needed for  nerve pain  #84 x 0   Entered and Authorized by:   [name] MD   Signed by:   [name] MD on 07/05/2009   Method used:   Samples Given   RxID:   [id number] BYSTOLIC 5 MG TABS (NEBIVOLOL HCL) One by mouth once daily for high blood pressure  #84 x 0   Entered and Authorized by:   [name] MD   Signed by:   [name] MD on 07/05/2009   Method used:   Samples Given   RxID:   [id number]

## 2010-02-27 NOTE — Assessment & Plan Note (Signed)
Summary: EXAMINE SPOTS--POSSIBLE SHINGLES/KB   Vital Signs:  Patient profile:   77 year old male Height:      70 inches (177.80 cm) Weight:      214.75 pounds (97.61 kg) O2 Sat:      92 % on Room air Temp:     97.4 degrees F (36.33 degrees C) oral Pulse rate:   73 / minute Pulse rhythm:   regular Resp:     16 per minute BP sitting:   132 / 80  (left arm) Cuff size:   regular  Vitals Entered By: [name] CMA (April 13, 2009 8:27 AM) Taken by Sydell Axon SMA  O2 Flow:  Room air CC: Pt states that he has some questions for the MD, Back Pain   Primary Care Provider:  [name] MD  CC:  Pt states that he has some questions for the MD and Back Pain.  History of Present Illness: He returns with a complaint that he has developed a painful rash on his left flank into his left lower abd. with chills. The pain on the left abd. is burning and percocet only controls the pain for about 2-3 hours but other narcotics were like "candy" for the pain.  He saw Urology and was found to have a small kidney stone on CT scan that is not  thought to be causing his pain. The CT scan also showed chronic, severe DDD at [facility].  Back Pain History:      The patient's back pain started approximately 06/10/2007.  The pain is located in the lower back region and does not radiate below the knees.  He states this is not work related.  On a scale of 1-10, he describes the pain as a 2.  He states that he has had a prior history of back pain.  The patient has not had any recent physical therapy for his back pain.  The following makes the back pain better: rest.  The following makes the back pain worse: bending.    Critical Exclusionary Diagnosis Criteria (CEDC) for Back Pain:      The patient denies a history of previous trauma.  He has no prior history of spinal surgery.  There are no symptoms to suggest infection, cauda equina, or psychosocial factors for back pain.  Cancer risk factors include age  >50 yrs with new back pain.     Current Medications (verified): 1)  Simvastatin 40 Mg Tabs (Simvastatin) .... Take One Tablet By Mouth Daily At Bedtime 2)  Atenolol 25 Mg Tabs (Atenolol) .Marland Kitchen.. 1 Once Daily 3)  Zolpidem Tartrate 10 Mg Tabs (Zolpidem Tartrate) .Marland Kitchen.. 1 At Bedtime As Needed 4)  Tramadol-Acetaminophen 37.5-325 Mg Tabs (Tramadol-Acetaminophen) .Marland Kitchen.. 1 Q8h As Needed 5)  Nasonex 50 Mcg/act Susp (Mometasone Furoate) .Marland Kitchen.. 1 To 2 Sprays Each Nostril Daily 6)  Ecotrin Low Strength 81 Mg Tbec (Aspirin) .... Take 3 Tablet By Mouth Once A Day 7)  Acid Reducer 75 Mg Tabs (Ranitidine Hcl) .Marland Kitchen.. 1 Once Daily 8)  Ra Suphedrine Pe 4-10 Mg Tabs (Chlorpheniramine-Phenylephrine) .Marland Kitchen.. 1 Two Times A Day 9)  Advair Diskus 250-50 Mcg/dose Aepb (Fluticasone-Salmeterol) .... One Puff Two Times A Day 10)  Sinus Rinse Bottle Kit  Pack (Hypertonic Nasal Wash) .... As Needed 11)  Hydrocodone-Acetaminophen 5-325 Mg Tabs (Hydrocodone-Acetaminophen) .... As Needed 12)  Acyclovir 800 Mg Tabs (Acyclovir) .... One By Mouth Three Times A Day For 10 Days 13)  Percocet 5-325 Mg Tabs (Oxycodone-Acetaminophen) .... Take 1  or 2 Tabs By Mouth Every 4-6 Hrs As Needed  Allergies (verified): No Known Drug Allergies  Past History:  Past Medical History: Reviewed history from 02/01/2009 and no changes required. 1. CAD, ARTERY BYPASS GRAFT/ 1988 (ICD-414.04): s/p CABG in [facility] in 1989.  Had myoview in 5/10 showing EF 50% and apical ischemia.  LHC was done in 5/10 showing 90% pLAD, 90% mLAD, 60% mCFX, 70% pRCA, 70% mRCA, 95% dRCA, SVG-PLV patent, LIMA-LAD patent, no other grafts found by aortic root shot.  Free radial-D presumed occluded.  2. COPD (ICD-496): PFTs 9/10 with FVC 65%, FEV1 57%.  On Advair.  3. OSTEOARTHRITIS (ICD-715.90) 4. NEPHROLITHIASIS, HX OF (ICD-V13.01) 5. GERD (ICD-530.81) 6. Tubulovillous adenomatous colon polyps with HGD 1999 7. Hyperlipidemia 8. Carotid stenosis: Followed by Dr. Madilyn Fireman.   Angiogram 5/10 showed left common carotid to be totally occluded and 50% RICA stenosis.  9.  HTN 10.  CVA 1986    Past Surgical History: Reviewed history from 02/01/2009 and no changes required. Coronary artery bypass graft 1989 Tonsillectomy 1941 cardiac catheterization April 2010 carotid artery angiogram April 2010  Family History: Reviewed history from 02/01/2009 and no changes required. father died of a self-inflicted wound age 64, history of EtOH mother died age 38 history of EtOH One sister is well No FH of Colon Cancer:  Social History: Reviewed history from 02/01/2009 and no changes required. 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  Alcohol Use - no Daily Caffeine Use: 2-3 cups weekly   Review of Systems       The patient complains of suspicious skin lesions.  The patient denies anorexia, fever, weight loss, chest pain, syncope, dyspnea on exertion, peripheral edema, prolonged cough, headaches, hemoptysis, abdominal pain, hematuria, incontinence, difficulty walking, enlarged lymph nodes, and angioedema.   General:  Complains of chills; denies fatigue, fever, loss of appetite, malaise, sleep disorder, sweats, weakness, and weight loss. Derm:  Complains of rash; denies changes in color of skin, changes in nail beds, dryness, excessive perspiration, flushing, hair loss, insect bite(s), itching, and poor wound healing.  Physical Exam  General:  alert, well-developed, well-nourished, well-hydrated, appropriate dress, normal appearance, healthy-appearing, cooperative to examination, good hygiene, and overweight-appearing.   Head:  normocephalic and atraumatic.   Mouth:  Oral mucosa and oropharynx without lesions or exudates.  Teeth in good repair. Neck:  supple, full ROM, no masses, no thyromegaly, no JVD, normal carotid upstroke, no carotid bruits, and no cervical  lymphadenopathy.   Lungs:  normal respiratory effort, no intercostal retractions, no accessory muscle use, normal breath sounds, no dullness, no fremitus, and no crackles.   Heart:  normal rate, regular rhythm, no murmur, no gallop, no rub, and no JVD.   Abdomen:  soft, non-tender, normal bowel sounds, no distention, no masses, no guarding, no rigidity, and no rebound tenderness.   Msk:  No deformity or scoliosis noted of thoracic or lumbar spine.   Pulses:  R and L carotid,radial,femoral,dorsalis pedis and posterior tibial pulses are full and equal bilaterally Extremities:  trace left pedal edema and trace right pedal edema.   Neurologic:  No cranial nerve deficits noted. Station and gait are normal. Plantar reflexes are down-going bilaterally. DTRs are symmetrical throughout. Sensory, motor and coordinative functions appear intact. Skin:  he has several groups of vesicles on an erythematous base extending along the left flank and LLQ following the T 11 dermatome. the lesions appear to  be very early and show no signs of secondary infection. Cervical Nodes:  no anterior cervical adenopathy and no posterior cervical adenopathy.   Axillary Nodes:  no R axillary adenopathy and no L axillary adenopathy.   Inguinal Nodes:  no R inguinal adenopathy and no L inguinal adenopathy.    Low Back Pain Physical Exam:    Inspection-deformity:     No    Palpation-spinal tenderness:   No    Motor Exam/Strength:         Left Ankle Dorsiflexion (L5,L4):     normal       Left Great Toe Dorsiflexion (L5,L4):     normal       Left Heel Walk (L5,some L4):     normal       Left Single Squat & Rise-Quads (L4):   normal       Left Toe Walk-calf (S1):       normal       Right Ankle Dorsiflexion (L5,L4):     normal       Right Great Toe Dorsiflexion (L5,L4):       normal       Right Heel Walk (L5,some L4):     normal       Right Single Squat & Rise Quads (L4):   normal       Right Toe Walk-calf (S1):       normal     Sensory Exam/Pinprick:        Left Medial Foot (L4):   normal       Left Dorsal Foot (L5):   normal       Left Lateral Foot (S1):   normal       Right Medial Foot (L4):   normal       Right Dorsal Foot (L5):   normal       Right Lateral Foot (S1):   normal    Reflexes:        Left Knee Jerk (L4):     normal       Left Ankle Reflex (S1):   normal       Right Knee Jerk:     normal       Right Ankle Reflex (S1):   normal    Straight Leg Raise (SLR):       Left Straight Leg Raise (SLR):   negative       Right Straight Leg Raise (SLR):   negative   Impression & Recommendations:  Problem # 1:  HERPES ZOSTER (ICD-053.9) continue acyclovir and percocet for pain, try lyrica for PHN.  Problem # 2:  BACK PAIN (ICD-724.5) Assessment: New  The following medications were removed from the medication list:    Tramadol-acetaminophen 37.5-325 Mg Tabs (Tramadol-acetaminophen) .Marland Kitchen... 1 q8h as needed    Hydrocodone-acetaminophen 5-325 Mg Tabs (Hydrocodone-acetaminophen) .Marland Kitchen... As needed His updated medication list for this problem includes:    Ecotrin Low Strength 81 Mg Tbec (Aspirin) .Marland Kitchen... Take 3 tablet by mouth once a day    Percocet 5-325 Mg Tabs (Oxycodone-acetaminophen) .Marland Kitchen... Take 1 or 2 tabs by mouth every 4-6 hrs as needed  Discussed use of moist heat or ice, modified activities, medications, and stretching/strengthening exercises. Back care instructions given. To be seen in 2 weeks if no improvement; sooner if worsening of symptoms.   Complete Medication List: 1)  Simvastatin 40 Mg Tabs (Simvastatin) .... Take one tablet by mouth daily at bedtime 2)  Atenolol 25 Mg Tabs (Atenolol) .Marland KitchenMarland KitchenMarland Kitchen 1  once daily 3)  Zolpidem Tartrate 10 Mg Tabs (Zolpidem tartrate) .Marland Kitchen.. 1 at bedtime as needed 4)  Nasonex 50 Mcg/act Susp (Mometasone furoate) .Marland Kitchen.. 1 to 2 sprays each nostril daily 5)  Ecotrin Low Strength 81 Mg Tbec (Aspirin) .... Take 3 tablet by mouth once a day 6)  Acid Reducer 75 Mg Tabs (Ranitidine hcl)  .Marland Kitchen.. 1 once daily 7)  Ra Suphedrine Pe 4-10 Mg Tabs (Chlorpheniramine-phenylephrine) .Marland Kitchen.. 1 two times a day 8)  Advair Diskus 250-50 Mcg/dose Aepb (Fluticasone-salmeterol) .... One puff two times a day 9)  Sinus Rinse Bottle Kit Pack (Hypertonic nasal wash) .... As needed 10)  Acyclovir 800 Mg Tabs (Acyclovir) .... One by mouth three times a day for 10 days 11)  Percocet 5-325 Mg Tabs (Oxycodone-acetaminophen) .... Take 1 or 2 tabs by mouth every 4-6 hrs as needed 12)  Lyrica 75 Mg Caps (Pregabalin) .... One by mouth three times a day as needed for pain from shingles  Patient Instructions: 1)  Please schedule a follow-up appointment in 2 weeks. Prescriptions: LYRICA 75 MG CAPS (PREGABALIN) One by mouth three times a day as needed for pain from shingles  #126 x 0   Entered and Authorized by:   [name] MD   Signed by:   [name] MD on 04/13/2009   Method used:   Samples Given   RxID:   [id number] PERCOCET 5-325 MG TABS (OXYCODONE-ACETAMINOPHEN) Take 1 or 2 tabs by mouth every 4-6 hrs as needed  #50 x 0   Entered and Authorized by:   [name] MD   Signed by:   [name] MD on 04/13/2009   Method used:   Print then Give to Patient   RxID:   [id number]   Appended Document: EXAMINE SPOTS--POSSIBLE SHINGLES/KB   Varicella Immunization History:    Varicella # 1:  Varicella (07/09/2005)

## 2010-02-27 NOTE — Assessment & Plan Note (Signed)
Summary: NECK/BUMPS---STC   Vital Signs:  Patient profile:   77 year old male Height:      72 inches Weight:      228 pounds BMI:     31.03 O2 Sat:      96 % on Room air Temp:     97.8 degrees F oral Pulse rate:   60 / minute Pulse rhythm:   regular Resp:     16 per minute BP sitting:   130 / 70  (left arm) Cuff size:   large  Vitals Entered By: [name] CMA (January 04, 2010 1:18 PM)  Nutrition Counseling: Patient's BMI is greater than 25 and therefore counseled on weight management options.  O2 Flow:  Room air  Primary Care Provider:  [name] MD   History of Present Illness: He returns today complaining of right neck pain that radiates into his right shoulder. He tells me that he was told a while ago that he had DDD and bone spurs in his neck. He remains concerned about a tumor on the right posterior side of his neck and feels like it is growing and feels uncomfortable. He was told before that it is a fatty tumor and not to worry about it but he remains concerned.  Hypertension History:      He denies headache, chest pain, palpitations, dyspnea with exertion, orthopnea, PND, peripheral edema, visual symptoms, neurologic problems, syncope, and side effects from treatment.  He notes no problems with any antihypertensive medication side effects.        Positive major cardiovascular risk factors include male age 34 years old or older, hyperlipidemia, and hypertension.  Negative major cardiovascular risk factors include no history of diabetes, negative family history for ischemic heart disease, and non-tobacco-user status.        Positive history for target organ damage include ASHD (either angina/prior MI/prior CABG).  Further assessment for target organ damage reveals no history of stroke/TIA, peripheral vascular disease, renal insufficiency, or hypertensive retinopathy.     Current Medications (verified): 1)  Crestor 20 Mg Tabs (Rosuvastatin Calcium) .... One  Daily 2)  Zolpidem Tartrate 10 Mg Tabs (Zolpidem Tartrate) .Marland Kitchen.. 1 At Bedtime As Needed 3)  Nasonex 50 Mcg/act Susp (Mometasone Furoate) .Marland Kitchen.. 1 To 2 Sprays Each Nostril Daily 4)  Ecotrin Low Strength 81 Mg Tbec (Aspirin) .... Take 3 Tablet By Mouth Once A Day 5)  Acid Reducer 75 Mg Tabs (Ranitidine Hcl) .Marland Kitchen.. 1 Once Daily 6)  Advair Diskus 250-50 Mcg/dose Aepb (Fluticasone-Salmeterol) .... One Puff Two Times A Day 7)  Sinus Rinse Bottle Kit  Pack (Hypertonic Nasal Wash) .... As Needed 8)  Tramadol-Acetaminophen 37.5-325 Mg Tabs (Tramadol-Acetaminophen) .... One By Mouth Qid As Needed For Pain 9)  Bystolic 5 Mg Tabs (Nebivolol Hcl) .... One By Mouth Once Daily For High Blood Pressure 10)  Lyrica 75 Mg Caps (Pregabalin) .... One By Mouth Three Times A Day As Needed For Nerve Pain 11)  Vitamin D 1000 Iu 12)  Calcium Citrate .Marland Kitchen.. 2tabs Daily 13)  Vitamin C 1000mg  .... Take 1 Tablet By Mouth Once A Day 14)  Glucosamine 1500mg  .... 2 Tabs Daily  Allergies (verified): No Known Drug Allergies  Past History:  Past Medical History: Last updated: 06/06/2009 1. CAD, ARTERY BYPASS GRAFT/ 1988 (ICD-414.04): s/p CABG in [facility] in [facility].  Had myoview in 5/10 showing EF 50% and apical ischemia.  LHC was done in 5/10 showing 90% pLAD, 90% mLAD, 60% mCFX, 70%  pRCA, 70% mRCA, 95% dRCA, SVG-PLV patent, LIMA-LAD patent, no other grafts found by aortic root shot.  Free radial-D presumed occluded.  2. COPD (ICD-496): PFTs 9/10 with FVC 65%, FEV1 57%.  On Advair.  3. OSTEOARTHRITIS (ICD-715.90) 4. NEPHROLITHIASIS, HX OF (ICD-V13.01) 5. GERD (ICD-530.81) 6. Tubulovillous adenomatous colon polyps with HGD 1999 7. Hyperlipidemia 8. Carotid stenosis: Followed by Dr. Madilyn Fireman.  Angiogram 5/10 showed left common carotid to be totally occluded and 50% RICA stenosis.  9.  HTN: ACEI cough 10.  CVA  11.  Shingles    Past Surgical History: Last updated: 02-16-09 Coronary artery bypass graft 1989 Tonsillectomy  1941 cardiac catheterization April 2010 carotid artery angiogram April 2010  Family History: Last updated: 2009/02/16 father died of a self-inflicted wound age 33, history of EtOH mother died age 53 history of EtOH One sister is well No FH of Colon Cancer:  Social History: Last updated: 02-16-09 Retired Single former [employer] in [place] Regular exercise-yes Alcohol use-no Quit smoking around 20 years ago.  Alcohol Use - no Daily Caffeine Use: 2-3 cups weekly   Risk Factors: Alcohol Use: 0 (09/06/2009) Exercise: yes (09/06/2008)  Risk Factors: Smoking Status: quit (09/06/2009)  Family History: Reviewed history from 02-16-2009 and no changes required. father died of a self-inflicted wound age 29, history of EtOH mother died age 81 history of EtOH One sister is well No FH of Colon Cancer:  Social History: Reviewed history from 16-Feb-2009 and no changes required. 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  Alcohol Use - no Daily Caffeine Use: 2-3 cups weekly   Review of Systems       The patient complains of weight gain.  The patient denies anorexia, fever, weight loss, chest pain, syncope, dyspnea on exertion, peripheral edema, prolonged cough, headaches, hemoptysis, abdominal pain, hematuria, suspicious skin lesions, abnormal bleeding, and angioedema.   Neuro:  Denies brief paralysis, disturbances in coordination, headaches, numbness, poor balance, tingling, tremors, visual disturbances, and weakness.  Physical Exam  General:  alert, well-developed, well-nourished, well-hydrated, appropriate dress, normal appearance, healthy-appearing, cooperative to examination, good hygiene, and overweight-appearing.   Head:  normocephalic and atraumatic.   Mouth:  Oral mucosa and oropharynx without lesions or exudates.  Teeth in good repair. Neck:  supple,  full ROM, no masses, no thyromegaly, no JVD, normal carotid upstroke, no carotid bruits, and no cervical lymphadenopathy.  there is a 3cm subcutaneously lesion over the right lateral posterior neck that is soft, freely mobile, nontender, nonfluctuant, and not fixed. there is no LAD and the skin is normal. Lungs:  normal respiratory effort, no intercostal retractions, no accessory muscle use, normal breath sounds, no dullness, no fremitus, and no crackles.   Heart:  normal rate, regular rhythm, no murmur, no gallop, no rub, and no JVD.   Abdomen:  soft, non-tender, normal bowel sounds, no distention, no masses, no guarding, no rigidity, and no rebound tenderness.   Msk:  normal ROM, no joint tenderness, no joint swelling, no joint warmth, no redness over joints, no joint deformities, no joint instability, no crepitation, and no muscle atrophy.   Pulses:  R and L carotid,radial,femoral,dorsalis pedis and posterior tibial pulses are full and equal bilaterally Extremities:  No clubbing, cyanosis, edema, or deformity noted with normal full range of motion of all joints.   Neurologic:  No cranial nerve deficits noted. Station and gait are normal. Plantar reflexes are  down-going bilaterally. DTRs are symmetrical throughout. Sensory, motor and coordinative functions appear intact. Skin:  turgor normal, color normal, no rashes, no suspicious lesions, no ecchymoses, no petechiae, no purpura, no ulcerations, and no edema.   Cervical Nodes:  no anterior cervical adenopathy and no posterior cervical adenopathy.   Axillary Nodes:  no R axillary adenopathy and no L axillary adenopathy.   Inguinal Nodes:  no R inguinal adenopathy and no L inguinal adenopathy.   Psych:  Oriented X3, memory intact for recent and remote, normally interactive, good eye contact, not depressed appearing, not agitated, not suicidal, and moderately anxious.     Impression & Recommendations:  Problem # 1:  NECK MASS (ICD-784.2) Assessment  New will get a CT scan done to look for a malignant process in light of his history of tobacco abuse Orders: Radiology Referral (Radiology)  Problem # 2:  NECK PAIN, RIGHT (ICD-723.1) Assessment: New will check the severity of the DDD His updated medication list for this problem includes:    Tramadol-acetaminophen 37.5-325 Mg Tabs (Tramadol-acetaminophen) ..... One by mouth qid as needed for pain  Orders: T-Cervical Spine Comp 4 Views [phone]) Radiology Referral (Radiology)  Problem # 3:  HYPERTENSION, BENIGN ESSENTIAL (ICD-401.1) Assessment: Improved  His updated medication list for this problem includes:    Bystolic 5 Mg Tabs (Nebivolol hcl) ..... One by mouth once daily for high blood pressure  Orders: Fingerstick ([id number]) TLB-Lipid Panel (80061-LIPID) TLB-BMP (Basic Metabolic Panel-BMET) (80048-METABOL) TLB-CBC Platelet - w/Differential (85025-CBCD)  BP today: 130/70 Prior BP: 126/77 (09/06/2009)  Prior 10 Yr Risk Heart Disease: N/A (07/05/2009)  Labs Reviewed: K+: 3.9 (05/31/2009) Creat: : 1.0 (05/31/2009)   Chol: 123 (09/11/2009)   HDL: 51.90 (09/11/2009)   LDL: 46 (09/11/2009)   TG: 126.0 (09/11/2009)  Problem # 4:  POSTHERPETIC NEURALGIA (ICD-053.19) Assessment: Improved  Complete Medication List: 1)  Crestor 20 Mg Tabs (Rosuvastatin calcium) .... One daily 2)  Zolpidem Tartrate 10 Mg Tabs (Zolpidem tartrate) .Marland Kitchen.. 1 at bedtime as needed 3)  Nasonex 50 Mcg/act Susp (Mometasone furoate) .Marland Kitchen.. 1 to 2 sprays each nostril daily 4)  Ecotrin Low Strength321 Mg Tbec (aspirin)  .... Taket by mouth once a day 5)  Acid Reducer 75 Mg Tabs (Ranitidine hcl) .Marland Kitchen.. 1 once daily 6)  Advair Diskus 250-50 Mcg/dose Aepb (Fluticasone-salmeterol) .... One puff two times a day 7)  Sinus Rinse Bottle Kit Pack (Hypertonic nasal wash) .... As needed 8)  Tramadol-acetaminophen 37.5-325 Mg Tabs (Tramadol-acetaminophen) .... One by mouth qid as needed for pain 9)  Bystolic 5 Mg Tabs  (Nebivolol hcl) .... One by mouth once daily for high blood pressure 10)  Lyrica 75 Mg Caps (Pregabalin) .... One by mouth three times a day as needed for nerve pain 11)  Vitamin D 1000 Iu  12)  Calcium Citrate  .Marland Kitchen.. 2tabs daily 13)  Vitamin C 1000mg   .... Take 1 tablet by mouth once a day 14)  Glucosamine 1500mg   .... 2 tabs daily  Hypertension Assessment/Plan:      The patient's hypertensive risk group is category C: Target organ damage and/or diabetes.  Today's blood pressure is 130/70.  His blood pressure goal is < 140/90.  Patient Instructions: 1)  Please schedule a follow-up appointment in 1 month. 2)  It is important that you exercise regularly at least 20 minutes 5 times a week. If you develop chest pain, have severe difficulty breathing, or feel very tired , stop exercising immediately and seek medical attention. 3)  You need  to lose weight. Consider a lower calorie diet and regular exercise.  4)  Check your Blood Pressure regularly. If it is above 130/80: you should make an appointment.   Orders Added: 1)  T-Cervical Spine Comp 4 Views [72050TC] 2)  Fingerstick [36416] 3)  TLB-Lipid Panel [80061-LIPID] 4)  TLB-BMP (Basic Metabolic Panel-BMET) [80048-METABOL] 5)  TLB-CBC Platelet - w/Differential [85025-CBCD] 6)  Radiology Referral [Radiology] 7)  Est. Patient Level IV [[address]]

## 2010-02-27 NOTE — Letter (Signed)
[facility]: [facility]   Imported By: Sherian Rein 04/17/2009 11:36:14  _____________________________________________________________________  External Attachment:    Type:   Image     Comment:   External Document

## 2010-02-27 NOTE — Assessment & Plan Note (Signed)
Summary: 2 wk f/u // # / cd   Vital Signs:  Patient profile:   77 year old male Height:      70 inches Weight:      212.50 pounds BMI:     30.60 O2 Sat:      98 % on Room air Temp:     97.6 degrees F oral Pulse rate:   62 / minute Pulse rhythm:   regular Resp:     16 per minute BP sitting:   128 / 82  (left arm) Cuff size:   large  Vitals Entered By: [name] CMA (April 27, 2009 9:33 AM)  Nutrition Counseling: Patient's BMI is greater than 25 and therefore counseled on weight management options.  O2 Flow:  Room air CC: back pain x 33wks Is Patient Diabetic? No Pain Assessment Patient in pain? yes     Location: back Onset of pain  Constant   Primary Care Provider:  [name] MD  CC:  back pain x 33wks.  History of Present Illness: He returns c/o persistent burning skin pain over the left flank and abd. and says that he doesn't think Lyrica and Percocet are helping. The blisters have all turned to scabs with no redness or drainage.  Preventive Screening-Counseling & Management  Alcohol-Tobacco     Alcohol drinks/day: 0     Smoking Status: quit     Year Started: 1956     Year Quit: 1995     Pack years: 40  Hep-HIV-STD-Contraception     Hepatitis Risk: no risk noted     HIV Risk: no risk noted     STD Risk: no risk noted      Sexual History:  not active.        Drug Use:  never.        Blood Transfusions:  no.    Medications Prior to Update: 1)  Simvastatin 40 Mg Tabs (Simvastatin) .... Take One Tablet By Mouth Daily At Bedtime 2)  Atenolol 25 Mg Tabs (Atenolol) .Marland Kitchen.. 1 Once Daily 3)  Zolpidem Tartrate 10 Mg Tabs (Zolpidem Tartrate) .Marland Kitchen.. 1 At Bedtime As Needed 4)  Nasonex 50 Mcg/act Susp (Mometasone Furoate) .Marland Kitchen.. 1 To 2 Sprays Each Nostril Daily 5)  Ecotrin Low Strength 81 Mg Tbec (Aspirin) .... Take 3 Tablet By Mouth Once A Day 6)  Acid Reducer 75 Mg Tabs (Ranitidine Hcl) .Marland Kitchen.. 1 Once Daily 7)  Ra Suphedrine Pe 4-10 Mg Tabs  (Chlorpheniramine-Phenylephrine) .Marland Kitchen.. 1 Two Times A Day 8)  Advair Diskus 250-50 Mcg/dose Aepb (Fluticasone-Salmeterol) .... One Puff Two Times A Day 9)  Sinus Rinse Bottle Kit  Pack (Hypertonic Nasal Wash) .... As Needed 10)  Acyclovir 800 Mg Tabs (Acyclovir) .... One By Mouth Three Times A Day For 10 Days 11)  Percocet 5-325 Mg Tabs (Oxycodone-Acetaminophen) .... Take 1 or 2 Tabs By Mouth Every 4-6 Hrs As Needed 12)  Lyrica 75 Mg Caps (Pregabalin) .... One By Mouth Three Times A Day As Needed For Pain From Shingles  Current Medications (verified): 1)  Simvastatin 40 Mg Tabs (Simvastatin) .... Take One Tablet By Mouth Daily At Bedtime 2)  Atenolol 25 Mg Tabs (Atenolol) .Marland Kitchen.. 1 Once Daily 3)  Zolpidem Tartrate 10 Mg Tabs (Zolpidem Tartrate) .Marland Kitchen.. 1 At Bedtime As Needed 4)  Nasonex 50 Mcg/act Susp (Mometasone Furoate) .Marland Kitchen.. 1 To 2 Sprays Each Nostril Daily 5)  Ecotrin Low Strength 81 Mg Tbec (Aspirin) .... Take 3 Tablet By Mouth Once A Day  6)  Acid Reducer 75 Mg Tabs (Ranitidine Hcl) .Marland Kitchen.. 1 Once Daily 7)  Ra Suphedrine Pe 4-10 Mg Tabs (Chlorpheniramine-Phenylephrine) .Marland Kitchen.. 1 Two Times A Day 8)  Advair Diskus 250-50 Mcg/dose Aepb (Fluticasone-Salmeterol) .... One Puff Two Times A Day 9)  Sinus Rinse Bottle Kit  Pack (Hypertonic Nasal Wash) .... As Needed 10)  Acyclovir 800 Mg Tabs (Acyclovir) .... One By Mouth Three Times A Day For 10 Days 11)  Carbamazepine 200 Mg Tabs (Carbamazepine) .... One By Mouth Three Times A Day For Pain From Shingles  Allergies (verified): No Known Drug Allergies  Past History:  Past Medical History: Reviewed history from 02/01/2009 and no changes required. 1. CAD, ARTERY BYPASS GRAFT/ 1988 (ICD-414.04): s/p CABG in [facility] in [facility].  Had myoview in 5/10 showing EF 50% and apical ischemia.  LHC was done in 5/10 showing 90% pLAD, 90% mLAD, 60% mCFX, 70% pRCA, 70% mRCA, 95% dRCA, SVG-PLV patent, LIMA-LAD patent, no other grafts found by aortic root shot.  Free  radial-D presumed occluded.  2. COPD (ICD-496): PFTs 9/10 with FVC 65%, FEV1 57%.  On Advair.  3. OSTEOARTHRITIS (ICD-715.90) 4. NEPHROLITHIASIS, HX OF (ICD-V13.01) 5. GERD (ICD-530.81) 6. Tubulovillous adenomatous colon polyps with HGD 1999 7. Hyperlipidemia 8. Carotid stenosis: Followed by Dr. Madilyn Fireman.  Angiogram 5/10 showed left common carotid to be totally occluded and 50% RICA stenosis.  9.  HTN 10.  CVA 1986    Past Surgical History: Reviewed history from 02/01/2009 and no changes required. Coronary artery bypass graft 1989 Tonsillectomy 1941 cardiac catheterization April 2010 carotid artery angiogram April 2010  Family History: Reviewed history from 02/01/2009 and no changes required. father died of a self-inflicted wound age 1, history of EtOH mother died age 88 history of EtOH One sister is well No FH of Colon Cancer:  Social History: Reviewed history from 02/01/2009 and no changes required. 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  Alcohol Use - no Daily Caffeine Use: 2-3 cups weekly   Review of Systems  The patient denies anorexia, fever, weight loss, weight gain, chest pain, dyspnea on exertion, peripheral edema, prolonged cough, headaches, hemoptysis, abdominal pain, suspicious skin lesions, abnormal bleeding, enlarged lymph nodes, and angioedema.   General:  Denies chills, fatigue, fever, loss of appetite, malaise, sweats, weakness, and weight loss. GI:  Denies abdominal pain, constipation, diarrhea, indigestion, nausea, and vomiting. GU:  Denies dysuria, hematuria, urinary frequency, and urinary hesitancy.  Physical Exam  General:  alert, well-developed, well-nourished, well-hydrated, appropriate dress, normal appearance, healthy-appearing, cooperative to examination, good hygiene, and overweight-appearing.   Head:  normocephalic and atraumatic.   Eyes:  No  corneal or conjunctival inflammation noted. EOMI. Perrla. Funduscopic exam benign, without hemorrhages, exudates or papilledema. Vision grossly normal. Mouth:  Oral mucosa and oropharynx without lesions or exudates.  Teeth in good repair. Neck:  supple, full ROM, no masses, no thyromegaly, no JVD, normal carotid upstroke, no carotid bruits, and no cervical lymphadenopathy.   Lungs:  normal respiratory effort, no intercostal retractions, no accessory muscle use, normal breath sounds, no dullness, no fremitus, and no crackles.   Heart:  normal rate, regular rhythm, no murmur, no gallop, no rub, and no JVD.   Abdomen:  soft, non-tender, normal bowel sounds, no distention, no masses, no guarding, no rigidity, and no rebound tenderness.   Skin:  he has several groups of scabs  extending along the left flank and LLQ  following the T 11 dermatome. the lesions show no signs of secondary infection. Cervical Nodes:  no anterior cervical adenopathy and no posterior cervical adenopathy.   Axillary Nodes:  no R axillary adenopathy and no L axillary adenopathy.   Inguinal Nodes:  no R inguinal adenopathy and no L inguinal adenopathy.   Psych:  Cognition and judgment appear intact. Alert and cooperative with normal attention span and concentration. No apparent delusions, illusions, hallucinations   Impression & Recommendations:  Problem # 1:  POSTHERPETIC NEURALGIA (ICD-053.19) Assessment New stop percocet and lyrica since he says they do not help with his pain and start carbamazepine  Problem # 2:  HERPES ZOSTER (ICD-053.9) Assessment: Improved  Complete Medication List: 1)  Simvastatin 40 Mg Tabs (Simvastatin) .... Take one tablet by mouth daily at bedtime 2)  Atenolol 25 Mg Tabs (Atenolol) .Marland Kitchen.. 1 once daily 3)  Zolpidem Tartrate 10 Mg Tabs (Zolpidem tartrate) .Marland Kitchen.. 1 at bedtime as needed 4)  Nasonex 50 Mcg/act Susp (Mometasone furoate) .Marland Kitchen.. 1 to 2 sprays each nostril daily 5)  Ecotrin Low Strength 81 Mg  Tbec (Aspirin) .... Take 3 tablet by mouth once a day 6)  Acid Reducer 75 Mg Tabs (Ranitidine hcl) .Marland Kitchen.. 1 once daily 7)  Ra Suphedrine Pe 4-10 Mg Tabs (Chlorpheniramine-phenylephrine) .Marland Kitchen.. 1 two times a day 8)  Advair Diskus 250-50 Mcg/dose Aepb (Fluticasone-salmeterol) .... One puff two times a day 9)  Sinus Rinse Bottle Kit Pack (Hypertonic nasal wash) .... As needed 10)  Acyclovir 800 Mg Tabs (Acyclovir) .... One by mouth three times a day for 10 days 11)  Carbamazepine 200 Mg Tabs (Carbamazepine) .... One by mouth three times a day for pain from shingles  Patient Instructions: 1)  Please schedule a follow-up appointment in 1 month. 2)  It is important that you exercise regularly at least 20 minutes 5 times a week. If you develop chest pain, have severe difficulty breathing, or feel very tired , stop exercising immediately and seek medical attention. 3)  You need to lose weight. Consider a lower calorie diet and regular exercise.  Prescriptions: CARBAMAZEPINE 200 MG TABS (CARBAMAZEPINE) One by mouth three times a day for pain from shingles  #90 x 1   Entered and Authorized by:   [name] MD   Signed by:   [name] MD on 04/27/2009   Method used:   Electronically to        [facility] # [id number]* (retail)       [address]       [place], [place]  [address]       Ph: [phone]       Fax: [phone]   RxID:   [id number]

## 2010-02-27 NOTE — Assessment & Plan Note (Signed)
Summary: 4 month rov.sl   Referring Provider:  Shirlee Latch Primary Provider:  [name] MD  CC:  pt has has a shingles flare since last time he was in.  Pt reports a reaction to the carbamazapine.  History of Present Illness: 77 yo with history of CAD s/p CABG, carotid stenosis, and COPD presents for cardiology followup.  He was seen by Dr. Shelva Majestic in the past.  He had a myoview suggesting ischemia in 5/10 followed by catheterization showing patent LIMA and SVG-PLV.  There was a presumably chronic occlusion of the SVG-OM.  There was no evidence by catheterization for lesion causing significant ischemia.  Patient additionally had angiogram for evaluation of carotid disease showing occluded left common carotid and 50% stenosis of RICA.  He sees pulmonology for COPD.  He notes dyspnea when walking up stairs or up the ramp at the [employer] for [place] basketball games.  No chest pain.  He is able to walk on an elliptical for a long period of time at a moderate pace without shortness of breath.  No orthopnea/PND.    At last appointment, I started patient on ACEI, but this was stopped due to cough.  He additionally had shingles on the left side of his abdomen with post-herpetic neuralgia involving the left side of the abdomen and the left flank.    ECG: NSR, LAFB, poor anterior R wave progression  Labs (1/11): BNP 57, TSH normal, LDL 98, HDL 49  Current Medications (verified): 1)  Simvastatin 40 Mg Tabs (Simvastatin) .... Take One Tablet By Mouth Daily At Bedtime 2)  Atenolol 25 Mg Tabs (Atenolol) .Marland Kitchen.. 1 Once Daily 3)  Zolpidem Tartrate 10 Mg Tabs (Zolpidem Tartrate) .Marland Kitchen.. 1 At Bedtime As Needed 4)  Nasonex 50 Mcg/act Susp (Mometasone Furoate) .Marland Kitchen.. 1 To 2 Sprays Each Nostril Daily 5)  Ecotrin Low Strength 81 Mg Tbec (Aspirin) .... Take 3 Tablet By Mouth Once A Day 6)  Acid Reducer 75 Mg Tabs (Ranitidine Hcl) .Marland Kitchen.. 1 Once Daily 7)  Ra Suphedrine Pe 4-10 Mg Tabs (Chlorpheniramine-Phenylephrine)  .Marland Kitchen.. 1 Two Times A Day 8)  Advair Diskus 250-50 Mcg/dose Aepb (Fluticasone-Salmeterol) .... One Puff Two Times A Day 9)  Sinus Rinse Bottle Kit  Pack (Hypertonic Nasal Wash) .... As Needed 10)  Acyclovir 800 Mg Tabs (Acyclovir) .... One By Mouth Three Times A Day For 10 Days 11)  Carbamazepine 200 Mg Tabs (Carbamazepine) .... One By Mouth Three Times A Day For Pain From Shingles 12)  Tramadol-Acetaminophen 37.5-325 Mg Tabs (Tramadol-Acetaminophen) .... As Needed  Allergies (verified): No Known Drug Allergies  Past History:  Past Medical History: 1. CAD, ARTERY BYPASS GRAFT/ 1988 (ICD-414.04): s/p CABG in [facility] in 1989.  Had myoview in 5/10 showing EF 50% and apical ischemia.  LHC was done in 5/10 showing 90% pLAD, 90% mLAD, 60% mCFX, 70% pRCA, 70% mRCA, 95% dRCA, SVG-PLV patent, LIMA-LAD patent, no other grafts found by aortic root shot.  Free radial-D presumed occluded.  2. COPD (ICD-496): PFTs 9/10 with FVC 65%, FEV1 57%.  On Advair.  3. OSTEOARTHRITIS (ICD-715.90) 4. NEPHROLITHIASIS, HX OF (ICD-V13.01) 5. GERD (ICD-530.81) 6. Tubulovillous adenomatous colon polyps with HGD 1999 7. Hyperlipidemia 8. Carotid stenosis: Followed by Dr. Madilyn Fireman.  Angiogram 5/10 showed left common carotid to be totally occluded and 50% RICA stenosis.  9.  HTN: ACEI cough 10.  CVA  11.  Shingles    Family History: Reviewed history from 02/01/2009 and no changes required. father died of a  self-inflicted wound age 64, history of EtOH mother died age 71 history of EtOH One sister is well No FH of Colon Cancer:  Social History: Reviewed history from 02/01/2009 and no changes required. 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  Alcohol Use - no Daily Caffeine Use: 2-3 cups weekly   Vital Signs:  Patient profile:   77 year old male Height:      72 inches Weight:      211.75 pounds BMI:      28.82 Pulse rate:   72 / minute Pulse rhythm:   regular BP sitting:   168 / 90  (left arm) Cuff size:   regular  Vitals Entered By: Judithe Modest CMA (Jun 06, 2009 2:19 PM)  Physical Exam  General:  Well developed, well nourished, in no acute distress. Neck:  Neck supple, no JVD. No masses, thyromegaly or abnormal cervical nodes. Lungs:  Clear bilaterally to auscultation and percussion. Heart:  Non-displaced PMI, chest non-tender; regular rate and rhythm, S1, S2 without murmurs, rubs or gallops. Carotid upstroke normal, left carotid bruit.  Pedals normal pulses. No edema, no varicosities. Abdomen:  Bowel sounds positive; abdomen soft and non-tender without masses, organomegaly, or hernias noted. No hepatosplenomegaly. Extremities:  No clubbing or cyanosis. Neurologic:  Alert and oriented x 3. Psych:  Normal affect.   Impression & Recommendations:  Problem # 1:  CAD, ARTERY BYPASS GRAFT/ 1988 (ICD-414.04) Stable, most recent cath in 2010 showed patent grafts.  He will continue ASA and atenolol as well as statin.  He did not tolerate ACEI.   I will get an echo to assess LV systolic function and wall motion.  We talked about increasing his exercise level.    Problem # 2:  CAROTID ARTERY STENOSIS (ICD-433.10) Followed by [facility] (former patient of Dr. Madilyn Fireman).   Problem # 3:  HYPERLIPIDEMIA-MIXED (ICD-272.4) Check lipids/LFTs on increase simvastatin.  Goal LDL < 70.   Other Orders: EKG w/ Interpretation (93000) Echocardiogram (Echo)  Patient Instructions: 1)  Your physician has requested that you have an echocardiogram.  Echocardiography is a painless test that uses sound waves to create images of your heart. It provides your doctor with information about the size and shape of your heart and how well your heart's chambers and valves are working.  This procedure takes approximately one hour. There are no restrictions for this procedure. 2)  Your physician recommends that you return for a  FASTING lipid profile/liver profile--you can get this when you come for the echocardiogram--414.05   3)  Your physician wants you to follow-up in: 1 year with Dr Shirlee Latch.  You will receive a reminder letter in the mail two months in advance. If you don't receive a letter, please call our office to schedule the follow-up appointment.   Vital Signs:  Patient profile:   77 year old male Height:      72 inches Weight:      211.75 pounds BMI:     28.82 Pulse rate:   72 / minute Pulse rhythm:   regular BP sitting:   168 / 90  (left arm) Cuff size:   regular  Vitals Entered By: Judithe Modest CMA (Jun 06, 2009 2:19 PM)

## 2010-02-27 NOTE — Progress Notes (Signed)
Summary: RX  Phone Note From Pharmacy   Caller: [facility] # [id number] Summary of Call: Pharm called, due to instructions on nasonex rx is very expensive. Pharm suggests directions to read 1 to 2 sprays in each nostril daily. Ok?  Initial call taken by: Lamar Sprinkles, CMA,  March 17, 2009 2:57 PM  Follow-up for Phone Call        yes Follow-up by: [name] MD,  March 17, 2009 3:01 PM    New/Updated Medications: NASONEX 50 MCG/ACT SUSP (MOMETASONE FUROATE) 1 to 2 sprays each nostril daily Prescriptions: NASONEX 50 MCG/ACT SUSP (MOMETASONE FUROATE) 1 to 2 sprays each nostril daily  #3 mth x 1   Entered by:   Lamar Sprinkles, CMA   Authorized by:   [name] MD   Signed by:   Lamar Sprinkles, CMA on 03/17/2009   Method used:   Electronically to        [facility] # [id number]* (retail)       [address]       [place], [place]  [address]       Ph: [phone]       Fax: [phone]   RxID:   [id number]

## 2010-02-27 NOTE — Progress Notes (Signed)
Summary: REFILL  Phone Note Refill Request Message from:  Fax from Pharmacy on August 30, 2009 2:14 PM  Refills Requested: Medication #1:  ZOLPIDEM TARTRATE 10 MG TABS 1 at bedtime as needed   Dosage confirmed as above?Dosage Confirmed   Supply Requested: 6 months   Last Refilled: 03/03/2009   It is this OK to refill   Method Requested: Telephone to Pharmacy Next Appointment Scheduled: 09/06/09 Initial call taken by: [name] CMA,  August 30, 2009 2:15 PM  Follow-up for Phone Call        ok Follow-up by: [name] MD,  August 30, 2009 2:32 PM    Prescriptions: ZOLPIDEM TARTRATE 10 MG TABS (ZOLPIDEM TARTRATE) 1 at bedtime as needed  #30 x 5   Entered by:   Lamar Sprinkles, CMA   Authorized by:   [name] MD   Signed by:   Lamar Sprinkles, CMA on 08/30/2009   Method used:   Telephoned to ...       [facility] [address] (retail)       [address]       [place], [place]  [address]       Ph: [phone]       Fax: [phone]   RxID:   [id number]

## 2010-02-27 NOTE — Progress Notes (Signed)
Summary: Simvastatin 80mg    Phone Note Outgoing Call   Call placed by: Katina Dung, RN, BSN,  July 14, 2009 6:48 PM Call placed to: Patient Summary of Call: Simvastatin 80mg   Follow-up for Phone Call        discussed with pt by telephone--pt agreed to change to Crestor 20mg  daily--pt had been on Lipitor in the past and had muscle aches and pains--he will return for fasting L/L profile 09/11/09    New/Updated Medications: CRESTOR 20 MG TABS (ROSUVASTATIN CALCIUM) one daily Prescriptions: CRESTOR 20 MG TABS (ROSUVASTATIN CALCIUM) one daily  #30 x 3   Entered by:   Katina Dung, RN, BSN   Authorized by:   [name], MD   Signed by:   Katina Dung, RN, BSN on 07/14/2009   Method used:   Electronically to        [facility] # [id number] (retail)       [address]       [place], [place]  [address]       Ph: [phone]       Fax: [phone]   RxID:   [id number]

## 2010-02-27 NOTE — Assessment & Plan Note (Signed)
Summary: HX OF POLYPS///EM   History of Present Illness Visit Type: consult  Primary GI MD: Elie Goody MD [facility] Primary Provider: [name] MD Requesting Provider: [name] MD Chief Complaint: Constipation and hemorrhoids History of Present Illness:   This is a 77 year old male who has had ongoing problems with constipation and has a history of a tubulovillous adenoma with severe dysplasia diagnosed in 1999. He notes occasional small amount of bright red blood per rectum that he attributes to hemorrhoids. His constipation appears to respond well to fiber. There has been no significant change in bowel habits. He states his last colonoscopy was 3 years ago, and he was recommended to have a colonoscopy in 3 years.   GI Review of Systems      Denies abdominal pain, acid reflux, belching, bloating, chest pain, dysphagia with liquids, dysphagia with solids, heartburn, loss of appetite, nausea, vomiting, vomiting blood, weight loss, and  weight gain.      Reports constipation and  hemorrhoids.     Denies anal fissure, black tarry stools, change in bowel habit, diarrhea, diverticulosis, fecal incontinence, heme positive stool, irritable bowel syndrome, jaundice, light color stool, liver problems, rectal bleeding, and  rectal pain.   Current Medications (verified): 1)  Simvastatin 20 Mg Tabs (Simvastatin) .Marland Kitchen.. 1 At Bedtime 2)  Atenolol 25 Mg Tabs (Atenolol) .Marland Kitchen.. 1 Once Daily 3)  Zolpidem Tartrate 10 Mg Tabs (Zolpidem Tartrate) .Marland Kitchen.. 1 At Bedtime As Needed 4)  Tramadol-Acetaminophen 37.5-325 Mg Tabs (Tramadol-Acetaminophen) .Marland Kitchen.. 1 Q8h As Needed 5)  Nasonex 50 Mcg/act Susp (Mometasone Furoate) .... Use Once Daily 6)  Bufferin 325 Mg Tabs (Aspirin Buf(Cacarb-Mgcarb-Mgo)) .Marland Kitchen.. 1 Once Daily 7)  Acid Reducer 75 Mg Tabs (Ranitidine Hcl) .Marland Kitchen.. 1 Once Daily 8)  Ra Suphedrine Pe 4-10 Mg Tabs (Chlorpheniramine-Phenylephrine) .Marland Kitchen.. 1 Two Times A Day 9)  Advair Diskus 250-50 Mcg/dose Aepb  (Fluticasone-Salmeterol) .... One Puff Two Times A Day 10)  Lisinopril 2.5 Mg Tabs (Lisinopril) .... Take One Tablet By Mouth Daily  Allergies (verified): No Known Drug Allergies  Past History:  Past Medical History: 1. CAD, ARTERY BYPASS GRAFT/ 1988 (ICD-414.04): s/p CABG in [facility] in 1989.  Had myoview in 5/10 showing EF 50% and apical ischemia.  LHC was done in 5/10 showing 90% pLAD, 90% mLAD, 60% mCFX, 70% pRCA, 70% mRCA, 95% dRCA, SVG-PLV patent, LIMA-LAD patent, no other grafts found by aortic root shot.  Free radial-D presumed occluded.  2. COPD (ICD-496): PFTs 9/10 with FVC 65%, FEV1 57%.  On Advair.  3. OSTEOARTHRITIS (ICD-715.90) 4. NEPHROLITHIASIS, HX OF (ICD-V13.01) 5. GERD (ICD-530.81) 6. Tubulovillous adenomatous colon polyps with HGD 1999 7. Hyperlipidemia 8. Carotid stenosis: Followed by Dr. Madilyn Fireman.  Angiogram 5/10 showed left common carotid to be totally occluded and 50% RICA stenosis.  9.  HTN 10.  CVA 1986    Past Surgical History: Coronary artery bypass graft 1989 Tonsillectomy 1941 cardiac catheterization 05-May-2010carotid artery angiogram 06-01-08 Family History: father died of a self-inflicted wound age 27, history of EtOH mother died age 33 history of EtOH One sister is well No FH of Colon Cancer:  Social History: Reviewed history from 01/30/2009 and no changes required. 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  Alcohol Use - no Daily Caffeine Use: 2-3 cups weekly   Review of Systems       The patient complains of allergy/sinus, arthritis/joint pain,  cough, hearing problems, itching, shortness of breath, and swelling of feet/legs.         The pertinent positives and negatives are noted as above and in the HPI. All other ROS were reviewed and were negative.   Vital Signs:  Patient profile:   77 year old male Height:      72  inches Weight:      221 pounds BMI:     30.08 BSA:     2.22 Pulse rate:   64 / minute Pulse rhythm:   regular BP sitting:   124 / 80  (left arm) Cuff size:   regular  Vitals Entered By: Ok Anis CMA (February 01, 2009 8:54 AM)  Physical Exam  General:  Well developed, well nourished, no acute distress. Head:  Normocephalic and atraumatic. Eyes:  PERRLA, no icterus. Ears:  Normal auditory acuity. Mouth:  No deformity or lesions, dentition normal. Neck:  Supple; no masses or thyromegaly. Lungs:  Clear throughout to auscultation. Heart:  Regular rate and rhythm; no murmurs, rubs,  or bruits. Abdomen:  Soft, nontender and nondistended. No masses, hepatosplenomegaly or hernias noted. Normal bowel sounds. Rectal:  deferred until time of colonoscopy.   Msk:  Symmetrical with no gross deformities. Normal posture. Pulses:  Normal pulses noted. Extremities:  No clubbing, cyanosis, edema or deformities noted. Neurologic:  Alert and  oriented x4;  grossly normal neurologically. Cervical Nodes:  No significant cervical adenopathy. Inguinal Nodes:  No significant inguinal adenopathy. Psych:  Alert and cooperative. Normal mood and affect.  Impression & Recommendations:  Problem # 1:  CONSTIPATION (ICD-564.00) Increase dietary fiber and water intake. May add a fiber supplement if needed. Orders: Colonoscopy (Colon)  Problem # 2:  COLONIC POLYPS, HX OF (ICD-V12.72) Personal history of tubulovillous adenomatous colon polyps with high-grade dysplasia in 1999. He states his last colonoscopy was in 2007. He was recommended to have a 3 year followup examination which is appropriate. The risks, benefits and alternatives to colonoscopy with possible biopsy and possible polypectomy were discussed with the patient and they consent to proceed. The procedure will be scheduled electively. Orders: Colonoscopy (Colon)  Problem # 3:  GERD (ICD-530.81) Continue standard antireflux measures and his  current medication. His symptoms are well-controlled.  Problem # 4:  RECTAL BLEEDING (ICD-569.3) Intermittent, small-volume hematochezia. Suspect hemorrhoidal etiology. Rule out other causes. Colonoscopy as above.  Patient Instructions: 1)  Colonoscopy brochure given.  2)  Conscious Sedation brochure given.  3)  High Fiber, Low Fat  Healthy Eating Plan brochure given.  4)  Copy sent to : Sanda Linger, MD 5)  The medication list was reviewed and reconciled.  All changed / newly prescribed medications were explained.  A complete medication list was provided to the patient / caregiver. Prescriptions: MOVIPREP 100 GM  SOLR (PEG-KCL-NACL-NASULF-NA ASC-C) As per prep instructions.  #1 x 0   Entered by:   Christie Nottingham CMA (AAMA)   Authorized by:   Meryl Dare MD [facility]   Signed by:   Meryl Dare MD FACG on 02/01/2009   Method used:   Electronically to        [facility] [address] (retail)       [address]       [place], [place]  [address]       Ph: [phone]       Fax: [phone]   RxID:   [id number]

## 2010-02-27 NOTE — Letter (Signed)
Summary: Colonoscopy/[facility]  Colonoscopy/[facility]   Imported By: Sherian Rein 02/15/2009 13:53:11  _____________________________________________________________________  External Attachment:    Type:   Image     Comment:   External Document

## 2010-02-27 NOTE — Progress Notes (Signed)
Summary: Lyrica  Phone Note Call from Patient   Summary of Call: Patient has been taking lyrica three times a day and c/o fatigue and feeling "groggy". Ok to decrease dose or frequency?  Initial call taken by: Lamar Sprinkles, CMA,  April 14, 2009 11:44 AM  Follow-up for Phone Call        yes Follow-up by: [name] MD,  April 14, 2009 11:47 AM  Additional Follow-up for Phone Call Additional follow up Details #1::        Pt informed  Additional Follow-up by: Lamar Sprinkles, CMA,  April 14, 2009 2:08 PM

## 2010-02-27 NOTE — Assessment & Plan Note (Signed)
Summary: 1 MTH FU  STC   Vital Signs:  Patient profile:   77 year old male Height:      70 inches Weight:      209 pounds BMI:     30.10 O2 Sat:      94 % on Room air Temp:     98.1 degrees F oral Pulse rate:   67 / minute Pulse rhythm:   regular Resp:     16 per minute BP sitting:   128 / 72  (left arm) Cuff size:   large  Vitals Entered By: [name] CMA (May 31, 2009 1:14 PM)  Nutrition Counseling: Patient's BMI is greater than 25 and therefore counseled on weight management options.  O2 Flow:  Room air  Primary Care Provider:  [name] MD   History of Present Illness: He returns for f/up and complains that he still occasional has "jolts" in the skin around his left lower abd. but he has not been taking carbamazepine b/c he thought it was an addicitve narcotic for pain. When it was first prescribed he took some doses and he felt better but then he quit.  He also has some urinary hesitancy symptoms today.  Preventive Screening-Counseling & Management  Alcohol-Tobacco     Alcohol drinks/day: 0     Smoking Status: quit     Year Started: 1956     Year Quit: 1995     Pack years: 40  Hep-HIV-STD-Contraception     Hepatitis Risk: no risk noted     HIV Risk: no risk noted     STD Risk: no risk noted      Sexual History:  not active.        Drug Use:  never.        Blood Transfusions:  no.    Medications Prior to Update: 1)  Simvastatin 40 Mg Tabs (Simvastatin) .... Take One Tablet By Mouth Daily At Bedtime 2)  Atenolol 25 Mg Tabs (Atenolol) .Marland Kitchen.. 1 Once Daily 3)  Zolpidem Tartrate 10 Mg Tabs (Zolpidem Tartrate) .Marland Kitchen.. 1 At Bedtime As Needed 4)  Nasonex 50 Mcg/act Susp (Mometasone Furoate) .Marland Kitchen.. 1 To 2 Sprays Each Nostril Daily 5)  Ecotrin Low Strength 81 Mg Tbec (Aspirin) .... Take 3 Tablet By Mouth Once A Day 6)  Acid Reducer 75 Mg Tabs (Ranitidine Hcl) .Marland Kitchen.. 1 Once Daily 7)  Ra Suphedrine Pe 4-10 Mg Tabs (Chlorpheniramine-Phenylephrine) .Marland Kitchen.. 1 Two Times A  Day 8)  Advair Diskus 250-50 Mcg/dose Aepb (Fluticasone-Salmeterol) .... One Puff Two Times A Day 9)  Sinus Rinse Bottle Kit  Pack (Hypertonic Nasal Wash) .... As Needed 10)  Acyclovir 800 Mg Tabs (Acyclovir) .... One By Mouth Three Times A Day For 10 Days 11)  Carbamazepine 200 Mg Tabs (Carbamazepine) .... One By Mouth Three Times A Day For Pain From Shingles  Current Medications (verified): 1)  Simvastatin 40 Mg Tabs (Simvastatin) .... Take One Tablet By Mouth Daily At Bedtime 2)  Atenolol 25 Mg Tabs (Atenolol) .Marland Kitchen.. 1 Once Daily 3)  Zolpidem Tartrate 10 Mg Tabs (Zolpidem Tartrate) .Marland Kitchen.. 1 At Bedtime As Needed 4)  Nasonex 50 Mcg/act Susp (Mometasone Furoate) .Marland Kitchen.. 1 To 2 Sprays Each Nostril Daily 5)  Ecotrin Low Strength 81 Mg Tbec (Aspirin) .... Take 3 Tablet By Mouth Once A Day 6)  Acid Reducer 75 Mg Tabs (Ranitidine Hcl) .Marland Kitchen.. 1 Once Daily 7)  Ra Suphedrine Pe 4-10 Mg Tabs (Chlorpheniramine-Phenylephrine) .Marland Kitchen.. 1 Two Times A Day 8)  Advair Diskus 250-50 Mcg/dose Aepb (Fluticasone-Salmeterol) .... One Puff Two Times A Day 9)  Sinus Rinse Bottle Kit  Pack (Hypertonic Nasal Wash) .... As Needed 10)  Acyclovir 800 Mg Tabs (Acyclovir) .... One By Mouth Three Times A Day For 10 Days 11)  Carbamazepine 200 Mg Tabs (Carbamazepine) .... One By Mouth Three Times A Day For Pain From Shingles 12)  Tramadol-Acetaminophen 37.5-325 Mg Tabs (Tramadol-Acetaminophen) .... As Needed  Allergies (verified): No Known Drug Allergies  Past History:  Past Medical History: Reviewed history from 02/01/2009 and no changes required. 1. CAD, ARTERY BYPASS GRAFT/ 1988 (ICD-414.04): s/p CABG in [facility] in 1989.  Had myoview in 5/10 showing EF 50% and apical ischemia.  LHC was done in 5/10 showing 90% pLAD, 90% mLAD, 60% mCFX, 70% pRCA, 70% mRCA, 95% dRCA, SVG-PLV patent, LIMA-LAD patent, no other grafts found by aortic root shot.  Free radial-D presumed occluded.  2. COPD (ICD-496): PFTs 9/10 with FVC 65%, FEV1  57%.  On Advair.  3. OSTEOARTHRITIS (ICD-715.90) 4. NEPHROLITHIASIS, HX OF (ICD-V13.01) 5. GERD (ICD-530.81) 6. Tubulovillous adenomatous colon polyps with HGD 1999 7. Hyperlipidemia 8. Carotid stenosis: Followed by Dr. Madilyn Fireman.  Angiogram 5/10 showed left common carotid to be totally occluded and 50% RICA stenosis.  9.  HTN 10.  CVA 1986    Past Surgical History: Reviewed history from 02/01/2009 and no changes required. Coronary artery bypass graft 1989 Tonsillectomy 1941 cardiac catheterization April 2010 carotid artery angiogram April 2010  Family History: Reviewed history from 02/01/2009 and no changes required. father died of a self-inflicted wound age 88, history of EtOH mother died age 58 history of EtOH One sister is well No FH of Colon Cancer:  Social History: Reviewed history from 02/01/2009 and no changes required. 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  Alcohol Use - no Daily Caffeine Use: 2-3 cups weekly   Review of Systems  The patient denies anorexia, fever, weight loss, weight gain, decreased hearing, hoarseness, chest pain, syncope, dyspnea on exertion, peripheral edema, prolonged cough, headaches, hemoptysis, abdominal pain, melena, hematochezia, severe indigestion/heartburn, hematuria, suspicious skin lesions, transient blindness, enlarged lymph nodes, angioedema, and testicular masses.   GU:  Complains of nocturia and urinary hesitancy; denies discharge, dysuria, erectile dysfunction, genital sores, hematuria, incontinence, and urinary frequency. Derm:  Denies changes in color of skin, changes in nail beds, dryness, excessive perspiration, flushing, itching, lesion(s), poor wound healing, and rash.  Physical Exam  General:  alert, well-developed, well-nourished, well-hydrated, appropriate dress, normal appearance, healthy-appearing, cooperative to  examination, good hygiene, and overweight-appearing.   Head:  normocephalic and atraumatic.   Ears:  R ear normal and L ear normal.   Mouth:  Oral mucosa and oropharynx without lesions or exudates.  Teeth in good repair. Neck:  supple, full ROM, no masses, no thyromegaly, no JVD, normal carotid upstroke, no carotid bruits, and no cervical lymphadenopathy.   Lungs:  normal respiratory effort, no intercostal retractions, no accessory muscle use, normal breath sounds, no dullness, no fremitus, and no crackles.   Heart:  normal rate, regular rhythm, no murmur, no gallop, no rub, and no JVD.   Abdomen:  soft, non-tender, normal bowel sounds, no distention, no masses, no guarding, no rigidity, and no rebound tenderness.   Rectal:  No external abnormalities noted. Normal sphincter tone. No rectal masses or tenderness. heme negative stool. Genitalia:  uncircumcised, no hydrocele, no varicocele, no scrotal masses, no  testicular masses or atrophy, no cutaneous lesions, and no urethral discharge.   Prostate:  no nodules, no induration, 1+ enlarged, and L asymmetrical enlargement.   Msk:  normal ROM, no joint tenderness, no joint swelling, no joint warmth, no redness over joints, no joint deformities, no joint instability, no crepitation, and no muscle atrophy.   Pulses:  R and L carotid,radial,femoral,dorsalis pedis and posterior tibial pulses are full and equal bilaterally Extremities:  No clubbing, cyanosis, edema, or deformity noted with normal full range of motion of all joints.   Neurologic:  No cranial nerve deficits noted. Station and gait are normal. Plantar reflexes are down-going bilaterally. DTRs are symmetrical throughout. Sensory, motor and coordinative functions appear intact. Skin:  turgor normal, color normal, no rashes, no suspicious lesions, no ecchymoses, no petechiae, no purpura, no ulcerations, and no edema.   Cervical Nodes:  no anterior cervical adenopathy and no posterior cervical  adenopathy.   Axillary Nodes:  no R axillary adenopathy and no L axillary adenopathy.   Inguinal Nodes:  no R inguinal adenopathy and no L inguinal adenopathy.   Psych:  Cognition and judgment appear intact. Alert and cooperative with normal attention span and concentration. No apparent delusions, illusions, hallucinations   Impression & Recommendations:  Problem # 1:  HYPERTROPHY PROSTATE W/UR OBST & OTH LUTS (ICD-600.01) he does not want to start an alpha-blocker to help with urine flow Orders: Venipuncture ([address]) TLB-BMP (Basic Metabolic Panel-BMET) (80048-METABOL) TLB-CBC Platelet - w/Differential (85025-CBCD) TLB-PSA (Prostate Specific Antigen) (84153-PSA) TLB-Udip w/ Micro (81001-URINE)  Problem # 2:  POSTHERPETIC NEURALGIA (ICD-053.19) Assessment: Unchanged he agrees to restart the carbamazepine Orders: Venipuncture (60454) TLB-BMP (Basic Metabolic Panel-BMET) (80048-METABOL) TLB-CBC Platelet - w/Differential (85025-CBCD) TLB-PSA (Prostate Specific Antigen) (84153-PSA) TLB-Udip w/ Micro (81001-URINE)  Problem # 3:  HERPES ZOSTER (ICD-053.9) Assessment: Improved  Orders: Venipuncture ([address]11) TLB-BMP (Basic Metabolic Panel-BMET) (80048-METABOL) TLB-CBC Platelet - w/Differential (85025-CBCD) TLB-PSA (Prostate Specific Antigen) (84153-PSA) TLB-Udip w/ Micro (81001-URINE)  Complete Medication List: 1)  Simvastatin 40 Mg Tabs (Simvastatin) .... Take one tablet by mouth daily at bedtime 2)  Atenolol 25 Mg Tabs (Atenolol) .Marland Kitchen.. 1 once daily 3)  Zolpidem Tartrate 10 Mg Tabs (Zolpidem tartrate) .Marland Kitchen.. 1 at bedtime as needed 4)  Nasonex 50 Mcg/act Susp (Mometasone furoate) .Marland Kitchen.. 1 to 2 sprays each nostril daily 5)  Ecotrin Low Strength 81 Mg Tbec (Aspirin) .... Take 3 tablet by mouth once a day 6)  Acid Reducer 75 Mg Tabs (Ranitidine hcl) .Marland Kitchen.. 1 once daily 7)  Ra Suphedrine Pe 4-10 Mg Tabs (Chlorpheniramine-phenylephrine) .Marland Kitchen.. 1 two times a day 8)  Advair Diskus 250-50 Mcg/dose  Aepb (Fluticasone-salmeterol) .... One puff two times a day 9)  Sinus Rinse Bottle Kit Pack (Hypertonic nasal wash) .... As needed 10)  Acyclovir 800 Mg Tabs (Acyclovir) .... One by mouth three times a day for 10 days 11)  Carbamazepine 200 Mg Tabs (Carbamazepine) .... One by mouth three times a day for pain from shingles 12)  Tramadol-acetaminophen 37.5-325 Mg Tabs (Tramadol-acetaminophen) .... As needed  Patient Instructions: 1)  Please schedule a follow-up appointment in 2 months. 2)  It is important that you exercise regularly at least 20 minutes 5 times a week. If you develop chest pain, have severe difficulty breathing, or feel very tired , stop exercising immediately and seek medical attention. 3)  You need to lose weight. Consider a lower calorie diet and regular exercise.

## 2010-02-27 NOTE — Letter (Signed)
Summary: Results Follow-up Letter  [facility]  [address] [place], [place] [address]   Phone: [phone]  Fax: [phone]    06/01/2009  [address] [place], [place]  [address]  Dear Mr. Watterson,   The following are the results of your recent test(s):  Test     Result     Prostate     normal Urine       dark, dehydrated CBC       normal Kidney     normal   ____labs look very good_____________________________________________________  Please call for an appointment as directed _________________________________________________________ _________________________________________________________ _________________________________________________________  Sincerely,  Sanda Linger MD [facility]

## 2010-02-27 NOTE — Assessment & Plan Note (Signed)
Summary: copd/apc   Visit Type:  Initial Consult Copy to:  Shirlee Latch Primary Provider/Referring Provider:  [name] MD  CC:  Pt here for pulmonary consult. Pt c/o S.O.B with activity worse when climbing flight of stairs  since bypass. Pt states has upcoming Colonoscopy Feb 1st and pt had recent lab work and needs Carbon Dioxide rechecked.  History of Present Illness: 77/M, ex smoker for evaluation of dyspnea.  He reports dyspne aon walking up a hill x 2 years. He reports sinus congestion x 6 yrs since returning to reside in [place] & has used Lloyd Huger Med rinse without relief. Evaluation by Dr Eulis Foster [facility] ) showed pF variability of 400-500 & moderate airway obstruction, FEV1 57%, ratio 65, FVC 65%. He was placed on advair 100/50 & higher dose of 250/50 restarrted recently seems to have helped. he reports ocnstatnt throat clearing & a dry cough emanating from his upper airways. He gets a chest cold 1-2 / yr & takes him 2-3 weeks to get over with an antibiotic. Labs showed a low bicarbonate level , otherwise nml. He smoked 32 pack yrs before quitting in 1990. He has been on lisinopril x 2 years.  Current Medications (verified): 1)  Simvastatin 20 Mg Tabs (Simvastatin) .Marland Kitchen.. 1 At Bedtime 2)  Atenolol 25 Mg Tabs (Atenolol) .Marland Kitchen.. 1 Once Daily 3)  Zolpidem Tartrate 10 Mg Tabs (Zolpidem Tartrate) .Marland Kitchen.. 1 At Bedtime As Needed 4)  Tramadol-Acetaminophen 37.5-325 Mg Tabs (Tramadol-Acetaminophen) .Marland Kitchen.. 1 Q8h As Needed 5)  Nasonex 50 Mcg/act Susp (Mometasone Furoate) .... Use Once Daily 6)  Ecotrin Low Strength 81 Mg Tbec (Aspirin) .... Take 3 Tablet By Mouth Once A Day 7)  Acid Reducer 75 Mg Tabs (Ranitidine Hcl) .Marland Kitchen.. 1 Once Daily 8)  Ra Suphedrine Pe 4-10 Mg Tabs (Chlorpheniramine-Phenylephrine) .Marland Kitchen.. 1 Two Times A Day 9)  Advair Diskus 250-50 Mcg/dose Aepb (Fluticasone-Salmeterol) .... One Puff Two Times A Day 10)  Lisinopril 2.5 Mg Tabs (Lisinopril) .... Take One Tablet By Mouth Daily 11)  Moviprep 100  Gm  Solr (Peg-Kcl-Nacl-Nasulf-Na Asc-C) .... As Per Prep Instructions. 12)  Sinus Rinse Bottle Kit  Pack (Hypertonic Nasal Wash) .... As Needed  Allergies (verified): No Known Drug Allergies  Past History:  Past Medical History: Last updated: 16-Feb-2009 1. CAD, ARTERY BYPASS GRAFT/ 1988 (ICD-414.04): s/p CABG in [facility] in [facility].  Had myoview in 5/10 showing EF 50% and apical ischemia.  LHC was done in 5/10 showing 90% pLAD, 90% mLAD, 60% mCFX, 70% pRCA, 70% mRCA, 95% dRCA, SVG-PLV patent, LIMA-LAD patent, no other grafts found by aortic root shot.  Free radial-D presumed occluded.  2. COPD (ICD-496): PFTs 9/10 with FVC 65%, FEV1 57%.  On Advair.  3. OSTEOARTHRITIS (ICD-715.90) 4. NEPHROLITHIASIS, HX OF (ICD-V13.01) 5. GERD (ICD-530.81) 6. Tubulovillous adenomatous colon polyps with HGD 1999 7. Hyperlipidemia 8. Carotid stenosis: Followed by Dr. Madilyn Fireman.  Angiogram 5/10 showed left common carotid to be totally occluded and 50% RICA stenosis.  9.  HTN 10.  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  Alcohol Use - no Daily Caffeine Use: 2-3 cups weekly   Review of Systems       The patient complains of shortness of breath with activity, productive cough, non-productive cough, acid heartburn, sore throat, nasal congestion/difficulty breathing through nose, and sneezing.  The patient denies shortness of breath at rest, coughing up blood, chest pain, irregular heartbeats,  indigestion, loss of appetite, weight change, abdominal pain, difficulty swallowing, tooth/dental problems, headaches, itching, ear ache, anxiety, depression, hand/feet swelling, joint stiffness or pain, rash, change in color of mucus, and fever.    Vital Signs:  Patient profile:   77 year old male Height:      72 inches Weight:      219.13 pounds O2 Sat:      93 % on Room air Temp:     98.8 degrees F oral Pulse rate:   67 / minute BP sitting:   138 / 80  (right arm) Cuff size:   regular  Vitals Entered By: Zackery Barefoot CMA (February 15, 2009 1:54 PM)  O2 Flow:  Room air CC: Pt here for pulmonary consult. Pt c/o S.O.B with activity worse when climbing flight of stairs  since bypass. Pt states has upcoming Colonoscopy Feb 1st, pt had recent lab work and needs Carbon Dioxide rechecked Comments Medications reviewed with patient Verified pt's contact number Zackery Barefoot CMA  February 15, 2009 1:55 PM    Physical Exam  Additional Exam:  Gen. Pleasant, well-nourished, in no distress, normal affect ENT - no lesions, no post nasal drip Neck: No JVD, no thyromegaly, no carotid bruits Lungs: no use of accessory muscles, no dullness to percussion, clear without rales or rhonchi  Cardiovascular: Rhythm regular, heart sounds  normal, no murmurs or gallops, no peripheral edema Abdomen: soft and non-tender, no hepatosplenomegaly, BS normal. Musculoskeletal: No deformities, no cyanosis or clubbing Neuro:  alert, non focal     Impression & Recommendations:  Problem # 1:  COPD (ICD-496) Assessment New Moderate COPD by lung function, based on his smoking. in the past. Unclear what has made him worse in recent years - decline in lung function vs upper airway cough/ vs allergies.  Stay  on advair 250/50 two times a day  stop lisinopril If no improvement in 2 months , consider adding spiriva & pulm rehab  Medications Added to Medication List This Visit: 1)  Ecotrin Low Strength 81 Mg  Tbec (Aspirin) .... Take 3 tablet by mouth once a day 2)  Sinus Rinse Bottle Kit Pack (Hypertonic nasal wash) .... As needed  Other Orders: Consultation Level III (04540)  Patient Instructions: 1)  Copy sent to: Dr Shirlee Latch, Dr Yetta Barre 2)  Please schedule a follow-up appointment in 2 months. 3)  STOP lisnopril 4)  Stay  on advair 250/50 two times a day     Appended Document: copd/apc reviewed records from [facility] (Dr Eulis Foster ) >> serial spirometry 6/10, 8/10 & 9/10 shows declining FEV1 from 84% to 57% Echo 5/10 shows global hypo , EF 45%

## 2010-02-27 NOTE — Progress Notes (Signed)
  Phone Note Outgoing Call   Call placed by: Lisabeth Devoid RN,  July 03, 2009 10:24 AM Call placed to: Patient  Follow-up for Phone Call        I spoke with Mr.Donaghey and gave him is ECHO and lab results along with Dr. Alford Highland recommendations to increase zocor to 80mg  at bedtime and repeat lab work in 2 months.  Mr. Krichbaum agrees and will call back if any problems with the increased Zocor.  Prescription called in to pharmacy for zocor.    New/Updated Medications: SIMVASTATIN 80 MG TABS (SIMVASTATIN) Take 1 tablet at bedtime. Prescriptions: SIMVASTATIN 80 MG TABS (SIMVASTATIN) Take 1 tablet at bedtime.  #90 x 6   Entered by:   Lisabeth Devoid RN   Authorized by:   [name], MD   Signed by:   Lisabeth Devoid RN on 07/03/2009   Method used:   Electronically to        [facility] [id number] (retail)       [address]       [place], [place]  [address]       Ph: [phone]       Fax: [phone]   RxID:   [id number]

## 2010-02-27 NOTE — Assessment & Plan Note (Signed)
Summary: FLU SHOT / [facility] Milch  Nurse Visit   Allergies: No Known Drug Allergies  Orders Added: 1)  Flu Vaccine 27yrs + MEDICARE PATIENTS [Q2039] 2)  Administration Flu vaccine - MCR [G0008]   Flu Vaccine Consent Questions     Do you have a history of severe allergic reactions to this vaccine? no    Any prior history of allergic reactions to egg and/or gelatin? no    Do you have a sensitivity to the preservative Thimersol? no    Do you have a past history of Guillan-Barre Syndrome? no    Do you currently have an acute febrile illness? no    Have you ever had a severe reaction to latex? no    Vaccine information given and explained to patient? yes    Are you currently pregnant? no    Lot Number:[id number]   Exp Date:07/28/2010   Site Given  Right Deltoid IM

## 2010-02-27 NOTE — Progress Notes (Signed)
Summary: Carotid u/s  Phone Note Call from Patient Call back at Home Phone [phone]   Summary of Call: Patient has an apt with [facility] specialist, where Dr Madilyn Fireman used to be. He has f/u carotid u/s Thursday. Patient wants to know if Dr Yetta Barre reccomends he keep apt or cancel. Please advise.  Initial call taken by: Lamar Sprinkles, CMA,  September 11, 2009 11:33 AM  Follow-up for Phone Call        keep appt. Follow-up by: [name] MD,  September 11, 2009 11:42 AM  Additional Follow-up for Phone Call Additional follow up Details #1::        Pt informed  Additional Follow-up by: Lamar Sprinkles, CMA,  September 11, 2009 12:03 PM

## 2010-02-27 NOTE — Assessment & Plan Note (Signed)
Summary: np6/pt had bypass/was a pt of dr. torelli/lg   Visit Type:  new pt visit Primary Provider:  [name] MD  CC:  pt has h/o bypass.  History of Present Illness: 77 yo with history of CAD s/p CABG, carotid stenosis, and COPD presents to establish cardiology followup.  He was seen by Dr. Shelva Majestic in the past.  He has developed dyspnea on exertion over the last year or so.  He had a myoview suggesting ischemia in 5/10 followed by catheterization showing patent LIMA and SVG-PLV.  There was a presumably chronic occlusion of the SVG-OM.  There was no evidence by catheterization for lesion causing significant ischemia.  Patient additionally had angiogram for evaluation of carotid disease showing occluded left common carotid and 50% stenosis of RICA.  Patient has seen a pulmonary doctor in [facility].  He had PFTs done in [facility] in 9/10 suggestive of COPD, but says that the pulmonologist told him his lungs were ok.  His PCP recently started him on Advair and he says his breathing has improved.  He notes dyspnea when walking up stairs or up the ramp at the [employer] for [employer] basketball games.  No chest pain.  He is able to walk on an elliptical for a long period of time at a moderate pace without shortness of breath.  No orthopnea/PND.    ECG: NSR, poor anterior R wave progression  No recent labs  Problems Prior to Update: 1)  Cad, Artery Bypass Graft/ 1988  (ICD-414.04) 2)  COPD  (ICD-496) 3)  Preventive Health Care  (ICD-V70.0) 4)  Osteoarthritis  (ICD-715.90) 5)  Nephrolithiasis, Hx of  (ICD-V13.01) 6)  Gerd  (ICD-530.81) 7)  Colonic Polyps, Hx of  (ICD-V12.72)  Current Medications (verified): 1)  Simvastatin 20 Mg Tabs (Simvastatin) .Marland Kitchen.. 1 At Bedtime 2)  Atenolol 25 Mg Tabs (Atenolol) .Marland Kitchen.. 1 Once Daily 3)  Zolpidem Tartrate 10 Mg Tabs (Zolpidem Tartrate) .Marland Kitchen.. 1 At Bedtime As Needed 4)  Tramadol-Acetaminophen 37.5-325 Mg Tabs (Tramadol-Acetaminophen) .Marland Kitchen.. 1 Q8h As  Needed 5)  Nasonex 50 Mcg/act Susp (Mometasone Furoate) .... Use Once Daily 6)  Bufferin 325 Mg Tabs (Aspirin Buf(Cacarb-Mgcarb-Mgo)) .Marland Kitchen.. 1 Once Daily 7)  Acid Reducer 75 Mg Tabs (Ranitidine Hcl) .Marland Kitchen.. 1 Once Daily 8)  Ra Suphedrine Pe 4-10 Mg Tabs (Chlorpheniramine-Phenylephrine) .Marland Kitchen.. 1 Two Times A Day 9)  Advair Diskus 250-50 Mcg/dose Aepb (Fluticasone-Salmeterol) .... One Puff Two Times A Day 10)  Lisinopril 2.5 Mg Tabs (Lisinopril) .... Take One Tablet By Mouth Daily  Allergies (verified): No Known Drug Allergies  Past History:  Past Medical History: 1. CAD, ARTERY BYPASS GRAFT/ 1988 (ICD-414.04): s/p CABG in [facility] in 1989.  Had myoview in 5/10 showing EF 50% and apical ischemia.  LHC was done in 5/10 showing 90% pLAD, 90% mLAD, 60% mCFX, 70% pRCA, 70% mRCA, 95% dRCA, SVG-PLV patent, LIMA-LAD patent, no other grafts found by aortic root shot.  Free radial-D presumed occluded.  2. COPD (ICD-496): PFTs 9/10 with FVC 65%, FEV1 57%.  On Advair.  3. OSTEOARTHRITIS (ICD-715.90) 4. NEPHROLITHIASIS, HX OF (ICD-V13.01) 5. GERD (ICD-530.81) 6. COLONIC POLYPS, HX OF (ICD-V12.72) 7. Hyperlipidemia 8. Carotid stenosis: Followed by Dr. Madilyn Fireman.  Angiogram 5/10 showed left common carotid to be totally occluded and 50% RICA stenosis.  9.  HTN 10.  CVA 2    Family History: Reviewed history from 09/06/2008 and no changes required. father died of a self-inflicted wound age 69, history of EtOH mother died age  93 history of EtOH  One sister is well  Social History: Retired Single former Personnel officer company in [employer] Regular exercise-yes Alcohol use-no Quit smoking around 20 years ago.   Review of Systems       All systems reviewed and negative except as per HPI.   Vital Signs:  Patient profile:   77 year old male Height:      72 inches Weight:      220 pounds BMI:     29.95 Pulse rate:   60 / minute Pulse rhythm:   irregular BP sitting:   126 / 80  (left  arm) Cuff size:   large  Vitals Entered By: Danielle Rankin, CMA (January 30, 2009 12:23 PM)  Physical Exam  General:  Well developed, well nourished, in no acute distress. Head:  normocephalic and atraumatic Nose:  no deformity, discharge, inflammation, or lesions Mouth:  Teeth, gums and palate normal. Oral mucosa normal. Neck:  Neck supple, no JVD. No masses, thyromegaly or abnormal cervical nodes. Lungs:  Clear bilaterally to auscultation and percussion. Heart:  Non-displaced PMI, chest non-tender; regular rate and rhythm, S1, S2 without murmurs,+S4. Carotid upstroke normal, right carotid bruit.  Pedals normal pulses. No edema, no varicosities. Abdomen:  Bowel sounds positive; abdomen soft and non-tender without masses, organomegaly, or hernias noted. No hepatosplenomegaly. Msk:  Back normal, normal gait. Muscle strength and tone normal. Extremities:  No clubbing or cyanosis. Neurologic:  Alert and oriented x 3. Skin:  Midline sternotomy scar. Psych:  Normal affect.   Impression & Recommendations:  Problem # 1:  CAD, ARTERY BYPASS GRAFT/ 1988 (ICD-414.04) Stable, no chest pain.  Recent catheterization showing patent LIMA and SVG-PLV.  No lesion that appeared to be causing severe ischemia.  Could have some ischemia in diagonal territory.  Would continue ASA, statin, and beta blocker.  Add ACEI (lisinopril 2.5 mg daily).  Will order echocardiogram to assess LV function.    Problem # 2:  CAROTID ARTERY STENOSIS (ICD-433.10) Totally occluded left common carotid artery.  50% RICA.  Followed by Dr. Madilyn Fireman.  Will make sure he has followup with [facility].   Problem # 3:  COPD (ICD-496) Seems to have had significant obstruction on PFTs from [place] done 9/10 but was told by pulmonologist there that his lungs were "ok."  I suspect a lot of his shortness of breath is from COPD.  Would continue Advair.  Refer to pulmonology for evaluation.  Checking BNP and echo to see if CHF would be more significant  contributor.    Problem # 4:  HYPERLIPIDEMIA Check lipids/LFTs.   Other Orders: [facility] Referral ([facility] Ref) Pulmonary Referral (Pulmonary)  Patient Instructions: 1)  Your physician has recommended you make the following change in your medication:  2)  start Lisinopril 2.5mg  daily 3)  Your physician recommends that you return for a FASTING lipid profile/liver profile/BMP/BNP/TSH in 7-10 days   414.04 v58.69 4)  You have been referred to [facility] for follow-up of your carotid arteries 5)  You have been referred to pulmonary for follow-up of your COPD 6)  Your physician recommends that you schedule a follow-up appointment in: 4 months with Dr. [name]  Prescriptions: LISINOPRIL 2.5 MG TABS (LISINOPRIL) Take one tablet by mouth daily  #30 x 6   Entered by:   Katina Dung, RN, BSN   Authorized by:   [name], MD   Signed by:   Katina Dung, RN, BSN on 01/30/2009   Method  used:   Electronically to        [facility] # [address]* (retail)       [address]       [place], [place]  [address]       Ph: [phone]       Fax: [phone]   RxID:   [id number]

## 2010-02-27 NOTE — Assessment & Plan Note (Signed)
Summary: 2 mos f/u //cd   Vital Signs:  Patient profile:   77 year old male Height:      72 inches Weight:      223 pounds BMI:     30.35 O2 Sat:      94 % on Room air Temp:     98.5 degrees F oral Pulse rate:   68 / minute Pulse rhythm:   regular Resp:     16 per minute BP sitting:   112 / 68  (left arm) Cuff size:   large  Vitals Entered By: [name] CMA (March 17, 2009 1:09 PM)  Nutrition Counseling: Patient's BMI is greater than 25 and therefore counseled on weight management options.  O2 Flow:  Room air CC: follow-up visit//discuss medication Is Patient Diabetic? No Pain Assessment Patient in pain? no        Primary Care Provider:  [name] MD  CC:  follow-up visit//discuss medication.  History of Present Illness: He returns for f/up and states that he feels well.  Preventive Screening-Counseling & Management  Alcohol-Tobacco     Alcohol drinks/day: 0     Smoking Status: quit     Year Started: 1956     Year Quit: 1995     Pack years: 40  Hep-HIV-STD-Contraception     Hepatitis Risk: no risk noted     HIV Risk: no risk noted     STD Risk: no risk noted      Sexual History:  not active.        Drug Use:  never.        Blood Transfusions:  no.    Clinical Review Panels:  Lipid Management   Cholesterol:  171 (02/10/2009)   LDL (bad choesterol):  98 (02/10/2009)   HDL (good cholesterol):  49.80 (02/10/2009)  Diabetes Management   Creatinine:  1.1 (02/15/2009)   Last Flu Vaccine:  given (09/28/2008)   Last Pneumovax:  given (09/28/2008)  Complete Metabolic Panel   Glucose:  106 (02/15/2009)   Sodium:  140 (02/15/2009)   Potassium:  3.8 (02/15/2009)   Chloride:  106 (02/15/2009)   CO2:  27 (02/15/2009)   BUN:  13 (02/15/2009)   Creatinine:  1.1 (02/15/2009)   Albumin:  4.5 (02/10/2009)   Total Protein:  7.9 (02/10/2009)   Calcium:  10.1 (02/15/2009)   Total Bili:  0.8 (02/10/2009)   Alk Phos:  58 (02/10/2009)   SGPT  (ALT):  27 (02/10/2009)   SGOT (AST):  30 (02/10/2009)   Medications Prior to Update: 1)  Simvastatin 40 Mg Tabs (Simvastatin) .... Take One Tablet By Mouth Daily At Bedtime 2)  Atenolol 25 Mg Tabs (Atenolol) .Marland Kitchen.. 1 Once Daily 3)  Zolpidem Tartrate 10 Mg Tabs (Zolpidem Tartrate) .Marland Kitchen.. 1 At Bedtime As Needed 4)  Tramadol-Acetaminophen 37.5-325 Mg Tabs (Tramadol-Acetaminophen) .Marland Kitchen.. 1 Q8h As Needed 5)  Nasonex 50 Mcg/act Susp (Mometasone Furoate) .... Use Once Daily 6)  Ecotrin Low Strength 81 Mg Tbec (Aspirin) .... Take 3 Tablet By Mouth Once A Day 7)  Acid Reducer 75 Mg Tabs (Ranitidine Hcl) .Marland Kitchen.. 1 Once Daily 8)  Ra Suphedrine Pe 4-10 Mg Tabs (Chlorpheniramine-Phenylephrine) .Marland Kitchen.. 1 Two Times A Day 9)  Advair Diskus 250-50 Mcg/dose Aepb (Fluticasone-Salmeterol) .... One Puff Two Times A Day 10)  Lisinopril 2.5 Mg Tabs (Lisinopril) .... Take One Tablet By Mouth Daily 11)  Sinus Rinse Bottle Kit  Pack (Hypertonic Nasal Wash) .... As Needed  Current Medications (verified): 1)  Simvastatin  40 Mg Tabs (Simvastatin) .... Take One Tablet By Mouth Daily At Bedtime 2)  Atenolol 25 Mg Tabs (Atenolol) .Marland Kitchen.. 1 Once Daily 3)  Zolpidem Tartrate 10 Mg Tabs (Zolpidem Tartrate) .Marland Kitchen.. 1 At Bedtime As Needed 4)  Tramadol-Acetaminophen 37.5-325 Mg Tabs (Tramadol-Acetaminophen) .Marland Kitchen.. 1 Q8h As Needed 5)  Nasonex 50 Mcg/act Susp (Mometasone Furoate) .... Use Once Daily 6)  Ecotrin Low Strength 81 Mg Tbec (Aspirin) .... Take 3 Tablet By Mouth Once A Day 7)  Acid Reducer 75 Mg Tabs (Ranitidine Hcl) .Marland Kitchen.. 1 Once Daily 8)  Ra Suphedrine Pe 4-10 Mg Tabs (Chlorpheniramine-Phenylephrine) .Marland Kitchen.. 1 Two Times A Day 9)  Advair Diskus 250-50 Mcg/dose Aepb (Fluticasone-Salmeterol) .... One Puff Two Times A Day 10)  Lisinopril 2.5 Mg Tabs (Lisinopril) .... Take One Tablet By Mouth Daily 11)  Sinus Rinse Bottle Kit  Pack (Hypertonic Nasal Wash) .... As Needed  Allergies (verified): No Known Drug Allergies  Past History:  Past  Medical History: Reviewed history from 02/01/2009 and no changes required. 1. CAD, ARTERY BYPASS GRAFT/ 1988 (ICD-414.04): s/p CABG in [facility] in 1989.  Had myoview in 5/10 showing EF 50% and apical ischemia.  LHC was done in 5/10 showing 90% pLAD, 90% mLAD, 60% mCFX, 70% pRCA, 70% mRCA, 95% dRCA, SVG-PLV patent, LIMA-LAD patent, no other grafts found by aortic root shot.  Free radial-D presumed occluded.  2. COPD (ICD-496): PFTs 9/10 with FVC 65%, FEV1 57%.  On Advair.  3. OSTEOARTHRITIS (ICD-715.90) 4. NEPHROLITHIASIS, HX OF (ICD-V13.01) 5. GERD (ICD-530.81) 6. Tubulovillous adenomatous colon polyps with HGD 1999 7. Hyperlipidemia 8. Carotid stenosis: Followed by Dr. Madilyn Fireman.  Angiogram 5/10 showed left common carotid to be totally occluded and 50% RICA stenosis.  9.  HTN 10.  CVA 1986    Past Surgical History: Reviewed history from 02/01/2009 and no changes required. Coronary artery bypass graft 1989 Tonsillectomy 1941 cardiac catheterization April 2010 carotid artery angiogram April 2010  Family History: Reviewed history from 02/01/2009 and no changes required. father died of a self-inflicted wound age 40, history of EtOH mother died age 66 history of EtOH One sister is well No FH of Colon Cancer:  Social History: Reviewed history from 02/01/2009 and no changes required. 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  Alcohol Use - no Daily Caffeine Use: 2-3 cups weekly  Sexual History:  not active  Review of Systems  The patient denies anorexia, fever, weight loss, weight gain, chest pain, syncope, peripheral edema, prolonged cough, headaches, hemoptysis, abdominal pain, melena, hematochezia, severe indigestion/heartburn, hematuria, suspicious skin lesions, enlarged lymph nodes, and angioedema.    Physical Exam  General:  alert, well-developed, well-nourished,  well-hydrated, appropriate dress, normal appearance, healthy-appearing, cooperative to examination, good hygiene, and overweight-appearing.   Mouth:  Oral mucosa and oropharynx without lesions or exudates.  Teeth in good repair. Neck:  supple, full ROM, no masses, no thyromegaly, no JVD, normal carotid upstroke, no carotid bruits, and no cervical lymphadenopathy.   Lungs:  normal respiratory effort, no intercostal retractions, no accessory muscle use, normal breath sounds, no dullness, no fremitus, and no crackles.   Heart:  normal rate, regular rhythm, no murmur, no gallop, no rub, and no JVD.   Abdomen:  soft, non-tender, normal bowel sounds, no distention, no masses, no guarding, no rigidity, and no rebound tenderness.   Msk:  No deformity or scoliosis noted of thoracic or lumbar spine.   Pulses:  R and L carotid,radial,femoral,dorsalis pedis and posterior tibial pulses are full and equal bilaterally Extremities:  trace left pedal edema and trace right pedal edema.   Neurologic:  No cranial nerve deficits noted. Station and gait are normal. Plantar reflexes are down-going bilaterally. DTRs are symmetrical throughout. Sensory, motor and coordinative functions appear intact. Skin:  Intact without suspicious lesions or rashes Cervical Nodes:  No lymphadenopathy noted; to fairly symmetrical nodules in the posterior neck area, consistent with sebaceous cysts Psych:  Cognition and judgment appear intact. Alert and cooperative with normal attention span and concentration. No apparent delusions, illusions, hallucinations   Impression & Recommendations:  Problem # 1:  COPD (ICD-496) Assessment Improved  His updated medication list for this problem includes:    Advair Diskus 250-50 Mcg/dose Aepb (Fluticasone-salmeterol) ..... One puff two times a day  Pulmonary Functions Reviewed: O2 sat: 94 (03/17/2009)     Vaccines Reviewed: Pneumovax: given (09/28/2008)   Flu Vax: given (09/28/2008)  Problem  # 2:  GERD (ICD-530.81) Assessment: Improved  His updated medication list for this problem includes:    Acid Reducer 75 Mg Tabs (Ranitidine hcl) .Marland Kitchen... 1 once daily  Complete Medication List: 1)  Simvastatin 40 Mg Tabs (Simvastatin) .... Take one tablet by mouth daily at bedtime 2)  Atenolol 25 Mg Tabs (Atenolol) .Marland Kitchen.. 1 once daily 3)  Zolpidem Tartrate 10 Mg Tabs (Zolpidem tartrate) .Marland Kitchen.. 1 at bedtime as needed 4)  Tramadol-acetaminophen 37.5-325 Mg Tabs (Tramadol-acetaminophen) .Marland Kitchen.. 1 q8h as needed 5)  Nasonex 50 Mcg/act Susp (Mometasone furoate) .... Use once daily 6)  Ecotrin Low Strength 81 Mg Tbec (Aspirin) .... Take 3 tablet by mouth once a day 7)  Acid Reducer 75 Mg Tabs (Ranitidine hcl) .Marland Kitchen.. 1 once daily 8)  Ra Suphedrine Pe 4-10 Mg Tabs (Chlorpheniramine-phenylephrine) .Marland Kitchen.. 1 two times a day 9)  Advair Diskus 250-50 Mcg/dose Aepb (Fluticasone-salmeterol) .... One puff two times a day 10)  Lisinopril 2.5 Mg Tabs (Lisinopril) .... Take one tablet by mouth daily 11)  Sinus Rinse Bottle Kit Pack (Hypertonic nasal wash) .... As needed  Patient Instructions: 1)  Please schedule a follow-up appointment in 4 months. 2)  It is important that you exercise regularly at least 20 minutes 5 times a week. If you develop chest pain, have severe difficulty breathing, or feel very tired , stop exercising immediately and seek medical attention. 3)  You need to lose weight. Consider a lower calorie diet and regular exercise.  4)  Check your Blood Pressure regularly. If it is above 130/80: you should make an appointment. Prescriptions: NASONEX 50 MCG/ACT SUSP (MOMETASONE FUROATE) use once daily  #3 x 6   Entered and Authorized by:   [name] MD   Signed by:   [name] MD on 03/17/2009   Method used:   Electronically to        [facility] # [id number] (retail)       [address]       [place], [place]  [address]       Ph: [phone]       Fax: [phone]   RxID:    [id number]

## 2010-02-27 NOTE — Progress Notes (Signed)
  Phone Note Refill Request Message from:  Fax from Pharmacy on March 03, 2009 10:30 AM  Refills Requested: Medication #1:  ZOLPIDEM TARTRATE 10 MG TABS 1 at bedtime as needed   Last Refilled: 01/13/2009 Is this ok  [facility]  Next Appointment Scheduled: 03/17/2009 Initial call taken by: [name] CMA,  March 03, 2009 10:30 AM  Follow-up for Phone Call        yes, rf X 5 Follow-up by: [name] MD,  March 03, 2009 10:33 AM    Prescriptions: ZOLPIDEM TARTRATE 10 MG TABS (ZOLPIDEM TARTRATE) 1 at bedtime as needed  #30 x 5   Entered by:   [name] CMA   Authorized by:   [name] MD   Signed by:   [name] CMA on 03/03/2009   Method used:   Telephoned to ...       [facility] # [address] (retail)       [address]       [place], [place]  [address]       Ph: [phone]       Fax: [phone]   RxID:   [id number]

## 2010-02-27 NOTE — Letter (Signed)
Summary: [facility] Instructions  [facility]  [address] [place], [place] [address]   Phone: [phone]  Fax: [phone]       KIMBER ESTERLY    1931/08/17    MRN: [id number]        Procedure Day /Date: Tuesday February 1st, 2011     Arrival Time: 10:00am     Procedure Time: 11:00am     Location of Procedure:                    _ x_  [facility] (4th Floor)                        PREPARATION FOR COLONOSCOPY WITH MOVIPREP   Starting 5 days prior to your procedure  02/23/09 do not eat nuts, seeds, popcorn, corn, beans, peas,  salads, or any raw vegetables.  Do not take any fiber supplements (e.g. Metamucil, Citrucel, and Benefiber).  THE DAY BEFORE YOUR PROCEDURE         DATE: 02/27/09   DAY:  Monday   1.  Drink clear liquids the entire day-NO SOLID FOOD  2.  Do not drink anything colored red or purple.  Avoid juices with pulp.  No orange juice.  3.  Drink at least 64 oz. (8 glasses) of fluid/clear liquids during the day to prevent dehydration and help the prep work efficiently.  CLEAR LIQUIDS INCLUDE: Water Jello Ice Popsicles Tea (sugar ok, no milk/cream) Powdered fruit flavored drinks Coffee (sugar ok, no milk/cream) Gatorade Juice: apple, white grape, white cranberry  Lemonade Clear bullion, consomm, broth Carbonated beverages (any kind) Strained chicken noodle soup Hard Candy                             4.  In the morning, mix first dose of MoviPrep solution:    Empty 1 Pouch A and 1 Pouch B into the disposable container    Add lukewarm drinking water to the top line of the container. Mix to dissolve    Refrigerate (mixed solution should be used within 24 hrs)  5.  Begin drinking the prep at 5:00 p.m. The MoviPrep container is divided by 4 marks.   Every 15 minutes drink the solution down to the next mark (approximately 8 oz) until the full liter is complete.   6.  Follow completed prep with 16 oz of clear liquid of your  choice (Nothing red or purple).  Continue to drink clear liquids until bedtime.  7.  Before going to bed, mix second dose of MoviPrep solution:    Empty 1 Pouch A and 1 Pouch B into the disposable container    Add lukewarm drinking water to the top line of the container. Mix to dissolve    Refrigerate  THE DAY OF YOUR PROCEDURE      DATE:  02/28/09  DAY:  Tuesday  Beginning at  6:00 a.m. (5 hours before procedure):         1. Every 15 minutes, drink the solution down to the next mark (approx 8 oz) until the full liter is complete.  2. Follow completed prep with 16 oz. of clear liquid of your choice.    3. You may drink clear liquids until  9:00am  (2 HOURS BEFORE PROCEDURE).   MEDICATION INSTRUCTIONS  Unless otherwise instructed, you should take regular prescription medications with a small sip of water  as early as possible the morning of your procedure.        OTHER INSTRUCTIONS  You will need a responsible adult at least 75 years of age to accompany you and drive you home.   This person must remain in the waiting room during your procedure.  Wear loose fitting clothing that is easily removed.  Leave jewelry and other valuables at home.  However, you may wish to bring a book to read or  an iPod/MP3 player to listen to music as you wait for your procedure to start.  Remove all body piercing jewelry and leave at home.  Total time from sign-in until discharge is approximately 2-3 hours.  You should go home directly after your procedure and rest.  You can resume normal activities the  day after your procedure.  The day of your procedure you should not:   Drive   Make legal decisions   Operate machinery   Drink alcohol   Return to work  You will receive specific instructions about eating, activities and medications before you leave.    The above instructions have been reviewed and explained to me by   Marchelle Folks.     I fully understand and can verbalize  these instructions _____________________________ Date _________

## 2010-02-27 NOTE — Procedures (Signed)
Summary: Colonoscopy  Patient: Alfred Armstrong Note: All result statuses are Final unless otherwise noted.  Tests: (1) Colonoscopy (COL)   COL Colonoscopy           DONE     [facility]     [address]     [place], [place]  [address]           COLONOSCOPY PROCEDURE REPORT           PATIENT:  Alfred Armstrong, Alfred Armstrong  MR#:  [id number]     BIRTHDATE:  02-28-1931, 77 yrs. old  GENDER:  male           ENDOSCOPIST:  Judie Petit T. Russella Dar, MD, [facility]           PROCEDURE DATE:  04/06/2009     PROCEDURE:  Colonoscopy with snare polypectomy, and with hot     biopsy     ASA CLASS:  Class II     INDICATIONS:  1) follow-up of polyp, Tubulovillous adenoma, HGD           MEDICATIONS:   Fentanyl 75 mcg IV, Versed 7 mg IV, Benadryl 50 mg     IV           DESCRIPTION OF PROCEDURE:   After the risks benefits and     alternatives of the procedure were thoroughly explained, informed     consent was obtained.  Digital rectal exam was performed and     revealed no abnormalities.   The LB [id number]-[id number] endoscope     was introduced through the anus and advanced to the cecum, which     was identified by both the appendix and ileocecal valve, without     limitations.  The quality of the prep was good, using MoviPrep.     The instrument was then slowly withdrawn as the colon was fully     examined.     <<PROCEDUREIMAGES>>           FINDINGS:  A sessile polyp was found in the ascending colon. It     was 10 mm in size. Polyp was snared, then cauterized with     monopolar cautery. Retrieval was successful. Piecemeal polypectomy     with polyp completely removed.  Two polyps were found in the mid     transverse colon. They were 4 - 5 mm in size. Polyps were snared     without cautery. Retrieval was successful. Two polyps were found     in the mid transverse colon. They were 3 mm in size. With hot     biopsy forceps the polyps were cauterized, biopses were obtained     and sent to pathology.  Moderate  diverticulosis was found sigmoid     to descending colon. This was otherwise a normal examination of     the colon. Retroflexed views in the rectum revealed internal     hemorrhoids., moderate.  The time to cecum =  3  minutes. The     scope was then withdrawn (time =  12.25  min) from the patient and     the procedure completed.           COMPLICATIONS:  None           ENDOSCOPIC IMPRESSION:     1) 10 mm sessile polyp in the ascending colon     2) 4 - 5 mm Two polyps in the mid transverse colon     3) 3  mm Two polyps in the mid transverse colon     4) Moderate diverticulosis in the sigmoid to descending     5) Internal hemorrhoids           RECOMMENDATIONS:     1) No aspirin or NSAID's for 2 weeks     2) Await pathology results     3) High fiber diet with liberal fluid intake.     4) Repeat Colonoscopy in 3 years.           Venita Lick. Russella Dar, MD, [facility]           CC: [name], MD           n.     Rosalie DoctorVenita Lick. Nicodemus Denk at 04/06/2009 10:09 AM           Estanislado Emms, [id number]  Note: An exclamation mark (!) indicates a result that was not dispersed into the flowsheet. Document Creation Date: 04/06/2009 10:09 AM _______________________________________________________________________  (1) Order result status: Final Collection or observation date-time: 04/06/2009 09:59 Requested date-time:  Receipt date-time:  Reported date-time:  Referring Physician:   Ordering Physician: Claudette Head [phone]) Specimen Source:  Source: Launa Grill Order Number: [id number] Lab site:   Appended Document: Colonoscopy     Procedures Next Due Date:    Colonoscopy: 03/2012

## 2010-02-27 NOTE — Letter (Signed)
Summary: Patient Notice- Polyp Results  [facility]  [address] [place], [place] [address]   Phone: [phone]  Fax: [phone]        April 11, 2009 MRN: [id number]    [facility] [address] [place], [place]  [address]    Dear Mr. Merle,  I am pleased to inform you that the colon polyp(s) removed during your recent colonoscopy was (were) found to be benign (no cancer detected) upon pathologic examination.  I recommend you have a repeat colonoscopy examination in 3 years to look for recurrent polyps, as having colon polyps increases your risk for having recurrent polyps or even colon cancer in the future.  Should you develop new or worsening symptoms of abdominal pain, bowel habit changes or bleeding from the rectum or bowels, please schedule an evaluation with either your primary care physician or with me.  Continue treatment plan as outlined the day of your exam.  Please call us if you are having persistent problems or have questions about your condition that have not been fully answered at this time.  Sincerely,  Meryl Dare MD [facility]  This letter has been electronically signed by your physician.  Appended Document: Patient Notice- Polyp Results Letter mailed 3.18.11

## 2010-02-27 NOTE — Letter (Signed)
Summary: Lipid Letter  [facility]  [address] [place], [place] [address]   Phone: [phone]  Fax: [phone]    01/04/2010  Alfred Armstrong [address] [place], [place]  [address]  Dear Alfred Armstrong:  We have carefully reviewed your last lipid profile from 01/04/2010 and the results are noted below with a summary of recommendations for lipid management.    Cholesterol:       167     Goal: <200   HDL "good" Cholesterol:   57.84     Goal: >40   LDL "bad" Cholesterol:   93     Goal: <100   Triglycerides:       122.0     Goal: <150    SUPER, more results to come    TLC Diet (Therapeutic Lifestyle Change): Saturated Fats & Transfatty acids should be kept < 7% of total calories ***Reduce Saturated Fats Polyunstaurated Fat can be up to 10% of total calories Monounsaturated Fat Fat can be up to 20% of total calories Total Fat should be no greater than 25-35% of total calories Carbohydrates should be 50-60% of total calories Protein should be approximately 15% of total calories Fiber should be at least 20-30 grams a day ***Increased fiber may help lower LDL Total Cholesterol should be < 200mg /day Consider adding plant stanol/sterols to diet (example: Benacol spread) ***A higher intake of unsaturated fat may reduce Triglycerides and Increase HDL    Adjunctive Measures (may lower LIPIDS and reduce risk of Heart Attack) include: Aerobic Exercise (20-30 minutes 3-4 times a week) Limit Alcohol Consumption Weight Reduction Aspirin 75-81 mg a day by mouth (if not allergic or contraindicated) Dietary Fiber 20-30 grams a day by mouth     Current Medications: 1)    Crestor 20 Mg Tabs (Rosuvastatin calcium) .... One daily 2)    Zolpidem Tartrate 10 Mg Tabs (Zolpidem tartrate) .Marland Kitchen.. 1 at bedtime as needed 3)    Nasonex 50 Mcg/act Susp (Mometasone furoate) .Marland Kitchen.. 1 to 2 sprays each nostril daily 4)    Ecotrin Low Strength321 Mg Tbec (aspirin)  .... Taket by mouth once a  day 5)    Acid Reducer 75 Mg Tabs (Ranitidine hcl) .Marland Kitchen.. 1 once daily 6)    Advair Diskus 250-50 Mcg/dose Aepb (Fluticasone-salmeterol) .... One puff two times a day 7)    Sinus Rinse Bottle Kit  Pack (Hypertonic nasal wash) .... As needed 8)    Tramadol-acetaminophen 37.5-325 Mg Tabs (Tramadol-acetaminophen) .... One by mouth qid as needed for pain 9)    Bystolic 5 Mg Tabs (Nebivolol hcl) .... One by mouth once daily for high blood pressure 10)    Lyrica 75 Mg Caps (Pregabalin) .... One by mouth three times a day as needed for nerve pain 11)    Vitamin D 1000 Iu  12)    Calcium Citrate  .Marland Kitchen.. 2tabs daily 13)    Vitamin C 1000mg   .... Take 1 tablet by mouth once a day 14)    Glucosamine 1500mg   .... 2 tabs daily  If you have any questions, please call. We appreciate being able to work with you.   Sincerely,    [facility] [name] MD

## 2010-02-27 NOTE — Assessment & Plan Note (Signed)
Summary: ER FU--DEGENERATIVE DISK---STC   Vital Signs:  Patient profile:   77 year old male Height:      72 inches Weight:      217 pounds BMI:     29.54 O2 Sat:      96 % on Room air Temp:     98.0 degrees F oral Pulse rate:   58 / minute Pulse rhythm:   regular Resp:     16 per minute BP sitting:   114 / 70  (left arm) Cuff size:   large  Vitals Entered By: [name] CMA (April 10, 2009 10:45 AM)  Nutrition Counseling: Patient's BMI is greater than 25 and therefore counseled on weight management options.  O2 Flow:  Room air  Primary Care Provider:  [name] MD   History of Present Illness: He returns c/o one week hx. of left flank pain that started as a jolt one week ago and is now a constant burning pain without radiation.  His labs done at the ER were normal (CBC,CMP,UA,Lipase) He had a CT scan done a few days ago that showed a small stone on the left side and DDD at L2-L3. He has not noticed any hematuria or dysuria. HE was given an Rx for Norco which makes him feel "drugged" but doesn't control the pain. His pain is not in the midline.  Preventive Screening-Counseling & Management  Alcohol-Tobacco     Alcohol drinks/day: 0     Smoking Status: quit     Year Started: 1956     Year Quit: 1995     Pack years: 40  Hep-HIV-STD-Contraception     Hepatitis Risk: no risk noted     HIV Risk: no risk noted     STD Risk: no risk noted  Medications Prior to Update: 1)  Simvastatin 40 Mg Tabs (Simvastatin) .... Take One Tablet By Mouth Daily At Bedtime 2)  Atenolol 25 Mg Tabs (Atenolol) .Marland Kitchen.. 1 Once Daily 3)  Zolpidem Tartrate 10 Mg Tabs (Zolpidem Tartrate) .Marland Kitchen.. 1 At Bedtime As Needed 4)  Tramadol-Acetaminophen 37.5-325 Mg Tabs (Tramadol-Acetaminophen) .Marland Kitchen.. 1 Q8h As Needed 5)  Nasonex 50 Mcg/act Susp (Mometasone Furoate) .Marland Kitchen.. 1 To 2 Sprays Each Nostril Daily 6)  Ecotrin Low Strength 81 Mg Tbec (Aspirin) .... Take 3 Tablet By Mouth Once A Day 7)  Acid Reducer 75 Mg  Tabs (Ranitidine Hcl) .Marland Kitchen.. 1 Once Daily 8)  Ra Suphedrine Pe 4-10 Mg Tabs (Chlorpheniramine-Phenylephrine) .Marland Kitchen.. 1 Two Times A Day 9)  Advair Diskus 250-50 Mcg/dose Aepb (Fluticasone-Salmeterol) .... One Puff Two Times A Day 10)  Lisinopril 2.5 Mg Tabs (Lisinopril) .... Take One Tablet By Mouth Daily 11)  Sinus Rinse Bottle Kit  Pack (Hypertonic Nasal Wash) .... As Needed  Current Medications (verified): 1)  Simvastatin 40 Mg Tabs (Simvastatin) .... Take One Tablet By Mouth Daily At Bedtime 2)  Atenolol 25 Mg Tabs (Atenolol) .Marland Kitchen.. 1 Once Daily 3)  Zolpidem Tartrate 10 Mg Tabs (Zolpidem Tartrate) .Marland Kitchen.. 1 At Bedtime As Needed 4)  Tramadol-Acetaminophen 37.5-325 Mg Tabs (Tramadol-Acetaminophen) .Marland Kitchen.. 1 Q8h As Needed 5)  Nasonex 50 Mcg/act Susp (Mometasone Furoate) .Marland Kitchen.. 1 To 2 Sprays Each Nostril Daily 6)  Ecotrin Low Strength 81 Mg Tbec (Aspirin) .... Take 3 Tablet By Mouth Once A Day 7)  Acid Reducer 75 Mg Tabs (Ranitidine Hcl) .Marland Kitchen.. 1 Once Daily 8)  Ra Suphedrine Pe 4-10 Mg Tabs (Chlorpheniramine-Phenylephrine) .Marland Kitchen.. 1 Two Times A Day 9)  Advair Diskus 250-50 Mcg/dose Aepb (Fluticasone-Salmeterol) .Marland KitchenMarland KitchenMarland Kitchen  One Puff Two Times A Day 10)  Sinus Rinse Bottle Kit  Pack (Hypertonic Nasal Wash) .... As Needed 11)  Hydrocodone-Acetaminophen 5-325 Mg Tabs (Hydrocodone-Acetaminophen) .... As Needed  Allergies (verified): No Known Drug Allergies  Past History:  Past Medical History: Reviewed history from 02/01/2009 and no changes required. 1. CAD, ARTERY BYPASS GRAFT/ 1988 (ICD-414.04): s/p CABG in [facility] in 1989.  Had myoview in 5/10 showing EF 50% and apical ischemia.  LHC was done in 5/10 showing 90% pLAD, 90% mLAD, 60% mCFX, 70% pRCA, 70% mRCA, 95% dRCA, SVG-PLV patent, LIMA-LAD patent, no other grafts found by aortic root shot.  Free radial-D presumed occluded.  2. COPD (ICD-496): PFTs 9/10 with FVC 65%, FEV1 57%.  On Advair.  3. OSTEOARTHRITIS (ICD-715.90) 4. NEPHROLITHIASIS, HX OF  (ICD-V13.01) 5. GERD (ICD-530.81) 6. Tubulovillous adenomatous colon polyps with HGD 1999 7. Hyperlipidemia 8. Carotid stenosis: Followed by Dr. Madilyn Fireman.  Angiogram 5/10 showed left common carotid to be totally occluded and 50% RICA stenosis.  9.  HTN 10.  CVA 1986    Past Surgical History: Reviewed history from 02/01/2009 and no changes required. Coronary artery bypass graft 1989 Tonsillectomy 1941 cardiac catheterization April 2010 carotid artery angiogram April 2010  Family History: Reviewed history from 02/01/2009 and no changes required. father died of a self-inflicted wound age 10, history of EtOH mother died age 32 history of EtOH One sister is well No FH of Colon Cancer:  Social History: Reviewed history from 02/01/2009 and no changes required. 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  Alcohol Use - no Daily Caffeine Use: 2-3 cups weekly   Review of Systems       The patient complains of abdominal pain.  The patient denies anorexia, fever, weight loss, weight gain, chest pain, syncope, dyspnea on exertion, peripheral edema, prolonged cough, headaches, hemoptysis, melena, hematochezia, severe indigestion/heartburn, hematuria, incontinence, genital sores, muscle weakness, suspicious skin lesions, enlarged lymph nodes, and testicular masses.   GI:  Complains of constipation; denies abdominal pain, bloody stools, dark tarry stools, diarrhea, excessive appetite, hemorrhoids, indigestion, loss of appetite, nausea, vomiting, vomiting blood, and yellowish skin color. GU:  Denies discharge, dysuria, hematuria, incontinence, nocturia, urinary frequency, and urinary hesitancy. MS:  Complains of thoracic pain; denies joint pain, joint redness, joint swelling, loss of strength, low back pain, muscle aches, cramps, and muscle weakness.  Physical Exam  General:  alert, well-developed,  well-nourished, well-hydrated, appropriate dress, normal appearance, healthy-appearing, cooperative to examination, good hygiene, and overweight-appearing.   Head:  normocephalic and atraumatic.   Eyes:  No corneal or conjunctival inflammation noted. EOMI. Perrla. Funduscopic exam benign, without hemorrhages, exudates or papilledema. Vision grossly normal. Mouth:  Oral mucosa and oropharynx without lesions or exudates.  Teeth in good repair. Neck:  supple, full ROM, no masses, no thyromegaly, no JVD, normal carotid upstroke, no carotid bruits, and no cervical lymphadenopathy.   Lungs:  normal respiratory effort, no intercostal retractions, no accessory muscle use, normal breath sounds, no dullness, no fremitus, and no crackles.   Heart:  normal rate, regular rhythm, no murmur, no gallop, no rub, and no JVD.   Abdomen:  soft, non-tender, normal bowel sounds, no distention, no masses, no guarding, no rigidity, and no rebound tenderness.   Rectal:  No external abnormalities noted. Normal sphincter tone. No rectal masses or tenderness. Genitalia:  Testes bilaterally descended without nodularity, tenderness or masses. No scrotal masses or lesions.  No penis lesions or urethral discharge. Prostate:   no nodules, no asymmetry, no induration, and 1+ enlarged.   Msk:  No deformity or scoliosis noted of thoracic or lumbar spine.   Pulses:  R and L carotid,radial,femoral,dorsalis pedis and posterior tibial pulses are full and equal bilaterally Extremities:  trace left pedal edema and trace right pedal edema.   Neurologic:  No cranial nerve deficits noted. Station and gait are normal. Plantar reflexes are down-going bilaterally. DTRs are symmetrical throughout. Sensory, motor and coordinative functions appear intact.   Impression & Recommendations:  Problem # 1:  NEPHROLITHIASIS, HX OF (ICD-V13.01)  Orders: UA Dipstick W/ Micro (manual) (09811) Urology Referral (Urology)  Problem # 2:  FLANK PAIN, LEFT  (ICD-789.09) Assessment: New it looks like he has a stone on the left side per the CT scan, all else is normal except the DDD which does not fit for his current pain complaint. His updated medication list for this problem includes:    Tramadol-acetaminophen 37.5-325 Mg Tabs (Tramadol-acetaminophen) .Marland Kitchen... 1 q8h as needed    Ecotrin Low Strength 81 Mg Tbec (Aspirin) .Marland Kitchen... Take 3 tablet by mouth once a day    Hydrocodone-acetaminophen 5-325 Mg Tabs (Hydrocodone-acetaminophen) .Marland Kitchen... As needed  Orders: UA Dipstick W/ Micro (manual) (91478) Urology Referral (Urology)  Discussed use of medications, application of heat or cold, and exercises.   Complete Medication List: 1)  Simvastatin 40 Mg Tabs (Simvastatin) .... Take one tablet by mouth daily at bedtime 2)  Atenolol 25 Mg Tabs (Atenolol) .Marland Kitchen.. 1 once daily 3)  Zolpidem Tartrate 10 Mg Tabs (Zolpidem tartrate) .Marland Kitchen.. 1 at bedtime as needed 4)  Tramadol-acetaminophen 37.5-325 Mg Tabs (Tramadol-acetaminophen) .Marland Kitchen.. 1 q8h as needed 5)  Nasonex 50 Mcg/act Susp (Mometasone furoate) .Marland Kitchen.. 1 to 2 sprays each nostril daily 6)  Ecotrin Low Strength 81 Mg Tbec (Aspirin) .... Take 3 tablet by mouth once a day 7)  Acid Reducer 75 Mg Tabs (Ranitidine hcl) .Marland Kitchen.. 1 once daily 8)  Ra Suphedrine Pe 4-10 Mg Tabs (Chlorpheniramine-phenylephrine) .Marland Kitchen.. 1 two times a day 9)  Advair Diskus 250-50 Mcg/dose Aepb (Fluticasone-salmeterol) .... One puff two times a day 10)  Sinus Rinse Bottle Kit Pack (Hypertonic nasal wash) .... As needed 11)  Hydrocodone-acetaminophen 5-325 Mg Tabs (Hydrocodone-acetaminophen) .... As needed  Patient Instructions: 1)  Please schedule a follow-up appointment in 2 weeks. 2)  Drink at least two extra quarts of water every day to prevent kidney stone formation. 3)  It is important that you exercise regularly at least 20 minutes 5 times a week. If you develop chest pain, have severe difficulty breathing, or feel very tired , stop exercising  immediately and seek medical attention. 4)  You need to lose weight. Consider a lower calorie diet and regular exercise.   Laboratory Results   Urine Tests    Routine Urinalysis   Color: lt. yellow Appearance: Clear Glucose: negative   (Normal Range: Negative) Bilirubin: negative   (Normal Range: Negative) Ketone: negative   (Normal Range: Negative) Spec. Gravity: 1.020   (Normal Range: 1.003-1.035) Blood: negative   (Normal Range: Negative) pH: 6.0   (Normal Range: 5.0-8.0) Protein: trace   (Normal Range: Negative) Urobilinogen: 0.2   (Normal Range: 0-1) Nitrite: negative   (Normal Range: Negative) Leukocyte Esterace: negative   (Normal Range: Negative)

## 2010-03-01 ENCOUNTER — Telehealth: Payer: Self-pay | Admitting: Internal Medicine

## 2010-03-01 NOTE — Miscellaneous (Signed)
Summary: Orders Update  Clinical Lists Changes  Problems: Added new problem of UNSPECIFIED PERIPHERAL VASCULAR DISEASE (ICD-443.9) Orders: Added new Test order of Arterial Duplex Lower Extremity (Arterial Duplex Low) - Signed 

## 2010-03-01 NOTE — Progress Notes (Signed)
  Phone Note Refill Request Message from:  Patient on February 14, 2010 9:41 AM  Refills Requested: Medication #1:  ADVAIR DISKUS 250-50 MCG/DOSE AEPB One puff two times a day   Supply Requested: 1 month  Medication #2:  NASONEX 50 MCG/ACT SUSP 1 to 2 sprays each nostril daily   Supply Requested: 1 month Initial call taken by: [name] CMA,  February 14, 2010 9:42 AM

## 2010-03-07 NOTE — Progress Notes (Signed)
Summary: refill  Phone Note Refill Request Message from:  Fax from Pharmacy on March 01, 2010 8:53 AM  Refills Requested: Medication #1:  ZOLPIDEM TARTRATE 10 MG TABS 1 at bedtime as needed   Last Refilled: 01/31/2010  Is this ok [facility]?  Initial call taken by: [name] CMA,  March 01, 2010 8:53 AM  Follow-up for Phone Call        yes Follow-up by: [name] MD,  March 01, 2010 9:09 AM    Prescriptions: ZOLPIDEM TARTRATE 10 MG TABS (ZOLPIDEM TARTRATE) 1 at bedtime as needed  #30 x 5   Entered by:   [name] CMA   Authorized by:   [name] MD   Signed by:   [name] CMA on 03/01/2010   Method used:   Telephoned to ...       [facility] [address] (retail)       [address]       [place], [place]  [address]       Ph: [phone]       Fax: [phone]   RxID:   [id number]

## 2010-03-09 ENCOUNTER — Ambulatory Visit (INDEPENDENT_AMBULATORY_CARE_PROVIDER_SITE_OTHER)
Admission: RE | Admit: 2010-03-09 | Discharge: 2010-03-09 | Disposition: A | Discharge status: Home / Self Care | Payer: No Typology Code available for payment source | Source: Ambulatory Visit | Attending: Internal Medicine | Admitting: Internal Medicine

## 2010-03-09 ENCOUNTER — Ambulatory Visit (INDEPENDENT_AMBULATORY_CARE_PROVIDER_SITE_OTHER): Payer: No Typology Code available for payment source | Admitting: Internal Medicine

## 2010-03-09 ENCOUNTER — Encounter: Payer: Self-pay | Admitting: Internal Medicine

## 2010-03-09 ENCOUNTER — Other Ambulatory Visit: Payer: Self-pay | Admitting: Internal Medicine

## 2010-03-09 DIAGNOSIS — R059 Cough, unspecified: Secondary | ICD-10-CM

## 2010-03-09 DIAGNOSIS — R05 Cough: Secondary | ICD-10-CM

## 2010-03-09 DIAGNOSIS — J449 Chronic obstructive pulmonary disease, unspecified: Secondary | ICD-10-CM

## 2010-03-09 DIAGNOSIS — I10 Essential (primary) hypertension: Secondary | ICD-10-CM

## 2010-03-14 ENCOUNTER — Other Ambulatory Visit (INDEPENDENT_AMBULATORY_CARE_PROVIDER_SITE_OTHER): Payer: No Typology Code available for payment source

## 2010-03-14 ENCOUNTER — Ambulatory Visit (INDEPENDENT_AMBULATORY_CARE_PROVIDER_SITE_OTHER): Payer: No Typology Code available for payment source | Admitting: Vascular Surgery

## 2010-03-14 ENCOUNTER — Other Ambulatory Visit: Payer: Self-pay

## 2010-03-14 ENCOUNTER — Encounter: Payer: Self-pay | Admitting: Internal Medicine

## 2010-03-14 DIAGNOSIS — I6529 Occlusion and stenosis of unspecified carotid artery: Secondary | ICD-10-CM

## 2010-03-14 DIAGNOSIS — Z48812 Encounter for surgical aftercare following surgery on the circulatory system: Secondary | ICD-10-CM

## 2010-03-15 NOTE — Assessment & Plan Note (Signed)
Summary: 6 MO ROV/NWS #   Vital Signs:  Patient profile:   77 year old male Height:      72 inches Weight:      222 pounds BMI:     30.22 O2 Sat:      94 % on Room air Temp:     98.1 degrees F oral Pulse rate:   63 / minute Pulse rhythm:   regular Resp:     16 per minute BP sitting:   128 / 72  (left arm) Cuff size:   large  Vitals Entered By: [name] CMA (March 09, 2010 1:03 PM)  Nutrition Counseling: Patient's BMI is greater than 25 and therefore counseled on weight management options.  O2 Flow:  Room air CC: follow-up visit, COPD follow-up Is Patient Diabetic? No Pain Assessment Patient in pain? no        Primary Care Provider:  [name] MD  CC:  follow-up visit and COPD follow-up.  History of Present Illness:  COPD Follow-Up      This is a 77 year old man who presents for COPD follow-up.  The patient reports cough and increased sputum, but denies shortness of breath, chest tightness, wheezing, heat intolerance, nocturnal awakening, and exercise induced symptoms.  Symptoms occur during the day and at rest.  The patient reports no limitation in activities.  Current treatment includes MDI without spacer.  Medication use includes controller med daily.  Symptoms are worse with exercise.    Preventive Screening-Counseling & Management  Alcohol-Tobacco     Alcohol drinks/day: 0     Alcohol Counseling: not indicated; patient does not drink     Smoking Status: quit     Year Started: 1956     Year Quit: 1995     Pack years: 40     Tobacco Counseling: to remain off tobacco products  Hep-HIV-STD-Contraception     Hepatitis Risk: no risk noted     HIV Risk: no risk noted     STD Risk: no risk noted      Sexual History:  not active.        Drug Use:  never.        Blood Transfusions:  no.    Clinical Review Panels:  Prevention   Last Colonoscopy:  DONE (04/06/2009)   Last PSA:  2.50 (05/31/2009)  Immunizations   Last Flu Vaccine:  Fluvax 3+  (11/02/2009)   Last Pneumovax:  given (09/28/2008)  Lipid Management   Cholesterol:  167 (01/04/2010)   LDL (bad choesterol):  93 (01/04/2010)   HDL (good cholesterol):  49.80 (01/04/2010)  Diabetes Management   Creatinine:  1.0 (01/04/2010)   Last Flu Vaccine:  Fluvax 3+ (11/02/2009)   Last Pneumovax:  given (09/28/2008)  CBC   WBC:  8.4 (01/04/2010)   RBC:  4.79 (01/04/2010)   Hgb:  15.1 (01/04/2010)   Hct:  43.9 (01/04/2010)   Platelets:  214.0 (01/04/2010)   MCV  91.7 (01/04/2010)   MCHC  34.5 (01/04/2010)   RDW  13.5 (01/04/2010)   PMN:  48.9 (01/04/2010)   Lymphs:  35.9 (01/04/2010)   Monos:  10.3 (01/04/2010)   Eosinophils:  4.2 (01/04/2010)   Basophil:  0.7 (01/04/2010)  Complete Metabolic Panel   Glucose:  102 (01/04/2010)   Sodium:  140 (01/04/2010)   Potassium:  4.2 (01/04/2010)   Chloride:  106 (01/04/2010)   CO2:  26 (01/04/2010)   BUN:  19 (01/04/2010)   Creatinine:  1.0 (01/04/2010)  Albumin:  4.0 (09/11/2009)   Total Protein:  7.0 (09/11/2009)   Calcium:  9.8 (01/04/2010)   Total Bili:  0.8 (09/11/2009)   Alk Phos:  52 (09/11/2009)   SGPT (ALT):  27 (09/11/2009)   SGOT (AST):  24 (09/11/2009)   Medications Prior to Update: 1)  Crestor 20 Mg Tabs (Rosuvastatin Calcium) .... One Daily 2)  Zolpidem Tartrate 10 Mg Tabs (Zolpidem Tartrate) .Marland Kitchen.. 1 At Bedtime As Needed 3)  Nasonex 50 Mcg/act Susp (Mometasone Furoate) .Marland Kitchen.. 1 To 2 Sprays Each Nostril Daily 4)  Ecotrin Low Strength321 Mg Tbec (Aspirin) .... Taket By Mouth Once A Day 5)  Acid Reducer 75 Mg Tabs (Ranitidine Hcl) .Marland Kitchen.. 1 Once Daily 6)  Advair Diskus 250-50 Mcg/dose Aepb (Fluticasone-Salmeterol) .... One Puff Two Times A Day 7)  Sinus Rinse Bottle Kit  Pack (Hypertonic Nasal Wash) .... As Needed 8)  Tramadol-Acetaminophen 37.5-325 Mg Tabs (Tramadol-Acetaminophen) .... One By Mouth Qid As Needed For Pain 9)  Bystolic 5 Mg Tabs (Nebivolol Hcl) .... One By Mouth Once Daily For High Blood  Pressure 10)  Lyrica 75 Mg Caps (Pregabalin) .... One By Mouth Three Times A Day As Needed For Nerve Pain 11)  Vitamin D 1000 Iu 12)  Calcium Citrate .Marland Kitchen.. 2tabs Daily 13)  Vitamin C 1000mg  .... Take 1 Tablet By Mouth Once A Day 14)  Glucosamine 1500mg  .... 2 Tabs Daily  Current Medications (verified): 1)  Crestor 20 Mg Tabs (Rosuvastatin Calcium) .... One Daily 2)  Zolpidem Tartrate 10 Mg Tabs (Zolpidem Tartrate) .Marland Kitchen.. 1 At Bedtime As Needed 3)  Nasonex 50 Mcg/act Susp (Mometasone Furoate) .Marland Kitchen.. 1 To 2 Sprays Each Nostril Daily 4)  Ecotrin Low Strength321 Mg Tbec (Aspirin) .... Taket By Mouth Once A Day 5)  Acid Reducer 75 Mg Tabs (Ranitidine Hcl) .Marland Kitchen.. 1 Once Daily 6)  Sinus Rinse Bottle Kit  Pack (Hypertonic Nasal Wash) .... As Needed 7)  Tramadol-Acetaminophen 37.5-325 Mg Tabs (Tramadol-Acetaminophen) .... One By Mouth Qid As Needed For Pain 8)  Bystolic 5 Mg Tabs (Nebivolol Hcl) .... One By Mouth Once Daily For High Blood Pressure 9)  Vitamin D 1000 Iu 10)  Calcium Citrate .Marland Kitchen.. 2tabs Daily 11)  Vitamin C 1000mg  .... Take 1 Tablet By Mouth Once A Day 12)  Glucosamine 1500mg  .... 2 Tabs Daily 13)  Advair Diskus 500-50 Mcg/dose Aepb (Fluticasone-Salmeterol) .... One Puff Two Times A Day  Allergies (verified): No Known Drug Allergies  Past History:  Past Medical History: Last updated: 06/06/2009 1. CAD, ARTERY BYPASS GRAFT/ 1988 (ICD-414.04): s/p CABG in [facility] in 1989.  Had myoview in 5/10 showing EF 50% and apical ischemia.  LHC was done in 5/10 showing 90% pLAD, 90% mLAD, 60% mCFX, 70% pRCA, 70% mRCA, 95% dRCA, SVG-PLV patent, LIMA-LAD patent, no other grafts found by aortic root shot.  Free radial-D presumed occluded.  2. COPD (ICD-496): PFTs 9/10 with FVC 65%, FEV1 57%.  On Advair.  3. OSTEOARTHRITIS (ICD-715.90) 4. NEPHROLITHIASIS, HX OF (ICD-V13.01) 5. GERD (ICD-530.81) 6. Tubulovillous adenomatous colon polyps with HGD 1999 7. Hyperlipidemia 8. Carotid stenosis:  Followed by Dr. Madilyn Fireman.  Angiogram 5/10 showed left common carotid to be totally occluded and 50% RICA stenosis.  9.  HTN: ACEI cough 10.  CVA  11.  Shingles    Past Surgical History: Last updated: 02/05/09 Coronary artery bypass graft 1989 Tonsillectomy 1941 cardiac catheterization April 2010 carotid artery angiogram April 2010  Family History: Last updated: 02/05/2009 father died of a self-inflicted wound age  67, history of EtOH mother died age 36 history of EtOH One sister is well No FH of Colon Cancer:  Social History: Last updated: 02/01/2009 Retired Single former [employer] in [place] Regular exercise-yes Alcohol use-no Quit smoking around 20 years ago.  Alcohol Use - no Daily Caffeine Use: 2-3 cups weekly   Risk Factors: Alcohol Use: 0 (03/09/2010) Exercise: yes (09/06/2008)  Risk Factors: Smoking Status: quit (03/09/2010)  Family History: Reviewed history from 02/01/2009 and no changes required. father died of a self-inflicted wound age 16, history of EtOH mother died age 30 history of EtOH One sister is well No FH of Colon Cancer:  Social History: Reviewed history from 02/01/2009 and no changes required. 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  Alcohol Use - no Daily Caffeine Use: 2-3 cups weekly   Review of Systems  The patient denies anorexia, fever, weight loss, weight gain, hoarseness, chest pain, syncope, dyspnea on exertion, peripheral edema, headaches, hemoptysis, abdominal pain, hematuria, suspicious skin lesions, depression, abnormal bleeding, and enlarged lymph nodes.   Resp:  Complains of cough and sputum productive; denies chest discomfort, chest pain with inspiration, coughing up blood, morning headaches, pleuritic, shortness of breath, and wheezing.  Physical Exam  General:  alert, well-developed, well-nourished,  well-hydrated, appropriate dress, normal appearance, healthy-appearing, cooperative to examination, good hygiene, and overweight-appearing.   Head:  normocephalic and atraumatic.   Ears:  R ear normal and L ear normal.   Nose:  External nasal examination shows no deformity or inflammation. Nasal mucosa are pink and moist without lesions or exudates. Mouth:  Oral mucosa and oropharynx without lesions or exudates.  Teeth in good repair. Neck:  supple, full ROM, no masses, no thyromegaly, no JVD, normal carotid upstroke, no carotid bruits, and no cervical lymphadenopathy.  there is a 3cm subcutaneously lesion over the right lateral posterior neck that is soft, freely mobile, nontender, nonfluctuant, and not fixed. there is no LAD and the skin is normal. Lungs:  normal respiratory effort, no intercostal retractions, no accessory muscle use, normal breath sounds, no dullness, no fremitus, and no crackles.   Heart:  normal rate, regular rhythm, no murmur, no gallop, no rub, and no JVD.   Abdomen:  soft, non-tender, normal bowel sounds, no distention, no masses, no guarding, no rigidity, and no rebound tenderness.   Msk:  normal ROM, no joint tenderness, no joint swelling, no joint warmth, no redness over joints, no joint deformities, no joint instability, no crepitation, and no muscle atrophy.   Pulses:  R and L carotid,radial,femoral,dorsalis pedis and posterior tibial pulses are full and equal bilaterally Extremities:  No clubbing, cyanosis, edema, or deformity noted with normal full range of motion of all joints.   Neurologic:  No cranial nerve deficits noted. Station and gait are normal. Plantar reflexes are down-going bilaterally. DTRs are symmetrical throughout. Sensory, motor and coordinative functions appear intact. Skin:  turgor normal, color normal, no rashes, no suspicious lesions, no ecchymoses, no petechiae, no purpura, no ulcerations, and no edema.   Cervical Nodes:  no anterior cervical  adenopathy and no posterior cervical adenopathy.   Psych:  Oriented X3, memory intact for recent and remote, normally interactive, good eye contact, not anxious appearing, not depressed appearing, and not agitated.     Impression & Recommendations:  Problem # 1:  COUGH (ICD-786.2) Assessment New will check for pna, masses, edema Orders: T-2 View  CXR (71020TC)  Problem # 2:  COPD (ICD-496) Assessment: Unchanged  The following medications were removed from the medication list:    Advair Diskus 250-50 Mcg/dose Aepb (Fluticasone-salmeterol) ..... One puff two times a day His updated medication list for this problem includes:    Advair Diskus 500-50 Mcg/dose Aepb (Fluticasone-salmeterol) ..... One puff two times a day  Pulmonary Functions Reviewed: O2 sat: 94 (03/09/2010)     Vaccines Reviewed: Pneumovax: given (09/28/2008)   Flu Vax: Fluvax 3+ (11/02/2009)  Problem # 3:  HYPERTENSION, BENIGN ESSENTIAL (ICD-401.1) Assessment: Improved  His updated medication list for this problem includes:    Bystolic 5 Mg Tabs (Nebivolol hcl) ..... One by mouth once daily for high blood pressure  BP today: 128/72 Prior BP: 130/70 (01/04/2010)  Prior 10 Yr Risk Heart Disease: N/A (07/05/2009)  Labs Reviewed: K+: 4.2 (01/04/2010) Creat: : 1.0 (01/04/2010)   Chol: 167 (01/04/2010)   HDL: 49.80 (01/04/2010)   LDL: 93 (01/04/2010)   TG: 122.0 (01/04/2010)  Complete Medication List: 1)  Crestor 20 Mg Tabs (Rosuvastatin calcium) .... One daily 2)  Zolpidem Tartrate 10 Mg Tabs (Zolpidem tartrate) .Marland Kitchen.. 1 at bedtime as needed 3)  Nasonex 50 Mcg/act Susp (Mometasone furoate) .Marland Kitchen.. 1 to 2 sprays each nostril daily 4)  Ecotrin Low Strength321 Mg Tbec (aspirin)  .... Taket by mouth once a day 5)  Acid Reducer 75 Mg Tabs (Ranitidine hcl) .Marland Kitchen.. 1 once daily 6)  Sinus Rinse Bottle Kit Pack (Hypertonic nasal wash) .... As needed 7)  Tramadol-acetaminophen 37.5-325 Mg Tabs (Tramadol-acetaminophen) .... One by  mouth qid as needed for pain 8)  Bystolic 5 Mg Tabs (Nebivolol hcl) .... One by mouth once daily for high blood pressure 9)  Vitamin D 1000 Iu  10)  Calcium Citrate  .Marland Kitchen.. 2tabs daily 11)  Vitamin C 1000mg   .... Take 1 tablet by mouth once a day 12)  Glucosamine 1500mg   .... 2 tabs daily 13)  Advair Diskus 500-50 Mcg/dose Aepb (Fluticasone-salmeterol) .... One puff two times a day  Patient Instructions: 1)  Please schedule a follow-up appointment in 2 months. 2)  It is important that you exercise regularly at least 20 minutes 5 times a week. If you develop chest pain, have severe difficulty breathing, or feel very tired , stop exercising immediately and seek medical attention. 3)  You need to lose weight. Consider a lower calorie diet and regular exercise.  4)  Check your Blood Pressure regularly. If it is above 140/90: you should make an appointment. Prescriptions: ADVAIR DISKUS 500-50 MCG/DOSE AEPB (FLUTICASONE-SALMETEROL) One puff two times a day  #3 inhs x 0   Entered and Authorized by:   [name] MD   Signed by:   [name] MD on 03/09/2010   Method used:   Samples Given   RxID:   [id number]    Orders Added: 1)  T-2 View CXR [71020TC] 2)  Est. Patient Level IV [30865]

## 2010-03-16 NOTE — Assessment & Plan Note (Signed)
OFFICE VISIT  Alfred Armstrong, Alfred Armstrong DOB:  05-Jan-1932                                       03/14/2010 EAVWU#:[id number]  I saw this patient in the office today for continued followup of his carotid disease.  The patient had previously been followed by Dr. Madilyn Fireman. He had a stroke in 1986 associated with expressive aphasia.  He had an occluded left internal carotid artery and Dr. Madilyn Fireman been following a moderate right carotid stenosis.  I am now assuming his follow-up of his carotids.  Since she was seen in our office last he has had no history of stroke, TIAs, expressive or receptive aphasia, or amaurosis fugax. He does take aspirin.  PAST MEDICAL HISTORY:  Significant for hypercholesterolemia  which has been stable on his current medications.  He also has a history of emphysema.  SOCIAL HISTORY:  He quit tobacco 20 years ago.  REVIEW OF SYSTEMS:  CARDIOVASCULAR:  He had no chest pain, chest pressure, palpitations or arrhythmias.  He has had no claudication, rest pain or nonhealing ulcers.  He had no history of DVT or phlebitis. NEUROLOGIC:  He has had no dizziness, blackouts, headaches or seizures.  PHYSICAL EXAMINATION:  General: No acute distress 120/70, 72, 20, 96% Lungs: clear Cardiovascular: left carotid bruit, RRR Abdomen: soft and non-tender, normal pitched bowel sounds Neuro: no focal weakness or paresthesias  I have reviewed his office from Dr. Barnett Applebaum records and this shows his cholesterol has been under good control.  His LDL cholesterol was 93, HDL is 50.  He has normal creatinine of 1.0.  It appears he also has a history of coronary artery disease.  He has undergone coronary revascularization in [place] in 1989.  He also has a history of gastroesophageal reflux disease.  I did independently interpret his carotid duplex scan which shows a known right internal carotid artery occlusion.  On the left side he has a 60 to 79% right carotid  stenosis.  I compared this to his previous study  in August 2011 and  there has been no significant increase in the velocities on the right.  Both vertebral arteries are patent with normally directed flow.  I explained we would not generally consider elective right carotid endarterectomy unless the stenosis progressed to greater than 80% or he developed new neurologic symptoms.  I have ordered a follow-up carotid duplex scan in 6 months.  I will plan on seeing him back in 1 year unless there has been some change in his duplex findings.  In the meantime he knows to call sooner if he has problems And he knows to continue taking is aspirin.    Di Kindle. Edilia Bo, M.D. Electronically Signed  CSD/MEDQ  D:  03/14/2010  T:  03/15/2010  Job:  [id number]  cc:   Sanda Linger, MD

## 2010-03-29 NOTE — Procedures (Unsigned)
CAROTID DUPLEX EXAM  INDICATION:  Carotid disease with known left CCA occlusion.  HISTORY: Diabetes:  No. Cardiac:  CABG. Hypertension:  Yes. Smoking:  Previous. Previous Surgery:  No. CV History:  Stroke in 1996. Amaurosis Fugax No, Paresthesias No, Hemiparesis No.                                      RIGHT             LEFT Brachial systolic pressure:         124               130 Brachial Doppler waveforms:         Normal            Normal Vertebral direction of flow:        Antegrade         Antegrade DUPLEX VELOCITIES (cm/sec) CCA peak systolic                   74                Occluded ECA peak systolic                   254               113 ICA peak systolic                   219               No flow seen ICA end diastolic                   78 PLAQUE MORPHOLOGY:                  Calcific          Mixed PLAQUE AMOUNT:                      Moderate          Severe PLAQUE LOCATION:                    ICA, ECA, CCA     CCA, ICA, ECA  IMPRESSION: 1. 60% to 79% stenosis of the right internal carotid artery. 2. Right external carotid artery stenosis. 3. Left common carotid artery known occlusion. 4. Unable to visualize flow in the left internal carotid artery. 5. Appears stable from the previous examination.  ___________________________________________ Di Kindle. Edilia Bo, M.D.  EM/MEDQ  D:  03/14/2010  T:  03/14/2010  Job:  [id number]

## 2010-04-05 NOTE — Letter (Signed)
Summary: [facility]  [facility]   Imported By: Sherian Rein 03/28/2010 08:41:40  _____________________________________________________________________  External Attachment:    Type:   Image     Comment:   External Document

## 2010-04-22 LAB — CBC
Hemoglobin: 15.2 g/dL (ref 13.0–17.0)
MCHC: 33.4 g/dL (ref 30.0–36.0)
MCV: 91.3 fL (ref 78.0–100.0)
RBC: 4.99 MIL/uL (ref 4.22–5.81)

## 2010-04-22 LAB — DIFFERENTIAL
Eosinophils Absolute: 0.2 10*3/uL (ref 0.0–0.7)
Lymphocytes Relative: 22 % (ref 12–46)
Lymphs Abs: 2.4 10*3/uL (ref 0.7–4.0)
Neutrophils Relative %: 70 % (ref 43–77)

## 2010-04-22 LAB — LIPASE, BLOOD: Lipase: 29 U/L (ref 11–59)

## 2010-04-22 LAB — URINALYSIS, ROUTINE W REFLEX MICROSCOPIC
Ketones, ur: 15 mg/dL — AB
Nitrite: NEGATIVE
Protein, ur: NEGATIVE mg/dL

## 2010-04-22 LAB — COMPREHENSIVE METABOLIC PANEL
CO2: 24 mEq/L (ref 19–32)
Calcium: 9.7 mg/dL (ref 8.4–10.5)
Creatinine, Ser: 1.15 mg/dL (ref 0.4–1.5)
GFR calc non Af Amer: >60 mL/min (ref >60)
Glucose, Bld: 110 mg/dL — ABNORMAL HIGH (ref 70–99)

## 2010-05-09 ENCOUNTER — Ambulatory Visit: Payer: No Typology Code available for payment source | Admitting: Internal Medicine

## 2010-05-15 ENCOUNTER — Ambulatory Visit: Payer: No Typology Code available for payment source | Admitting: Internal Medicine

## 2010-05-15 ENCOUNTER — Encounter: Payer: Self-pay | Admitting: Internal Medicine

## 2010-05-15 ENCOUNTER — Ambulatory Visit (INDEPENDENT_AMBULATORY_CARE_PROVIDER_SITE_OTHER): Payer: No Typology Code available for payment source | Admitting: Internal Medicine

## 2010-05-15 VITALS — BP 118/68 | HR 58 | Temp 98.2°F | Resp 16 | Ht 72.0 in | Wt 223.0 lb

## 2010-05-15 DIAGNOSIS — J209 Acute bronchitis, unspecified: Secondary | ICD-10-CM

## 2010-05-15 DIAGNOSIS — I10 Essential (primary) hypertension: Secondary | ICD-10-CM

## 2010-05-15 DIAGNOSIS — B0229 Other postherpetic nervous system involvement: Secondary | ICD-10-CM

## 2010-05-15 DIAGNOSIS — J449 Chronic obstructive pulmonary disease, unspecified: Secondary | ICD-10-CM

## 2010-05-15 MED ORDER — TIOTROPIUM BROMIDE MONOHYDRATE 18 MCG IN CAPS: 18.0000 ug | ORAL_CAPSULE | Freq: Every day | RESPIRATORY_TRACT | Status: DC

## 2010-05-15 MED ORDER — CEFUROXIME AXETIL 500 MG PO TABS: 500.0000 mg | ORAL_TABLET | Freq: Two times a day (BID) | ORAL | Status: AC

## 2010-05-15 MED ORDER — PSEUDOEPH-CHLORPHEN-HYDROCOD 60-4-5 MG/5ML PO SOLN: 5.0000 mL | Freq: Four times a day (QID) | ORAL | Status: DC | PRN

## 2010-05-15 NOTE — Assessment & Plan Note (Signed)
Start ceftin for infection and zutripro for the symptoms

## 2010-05-15 NOTE — Patient Instructions (Signed)
Bronchitis Bronchitis is the body's way of reacting to injury and/or infection (inflammation) of the bronchi. Bronchi are the air tubes that extend from the windpipe into the lungs. If the inflammation becomes severe, it may cause shortness of breath.  CAUSES Inflammation may be caused by:  A virus.   Germs (bacteria).   Dust.   Allergens.   Pollutants and many other irritants.  The cells lining the bronchial tree are covered with tiny hairs (cilia). These constantly beat upward, away from the lungs, toward the mouth. This keeps the lungs free of pollutants. When these cells become too irritated and are unable to do their job, mucus begins to develop. This causes the characteristic cough of bronchitis. The cough clears the lungs when the cilia are unable to do their job. Without either of these protective mechanisms, the mucus would settle in the lungs. Then you would develop pneumonia. Smoking is a common cause of bronchitis and can contribute to pneumonia. Stopping this habit is the single most important thing you can do to help yourself. TREATMENT  Your caregiver may prescribe an antibiotic if the cough is caused by bacteria. Also, medicines that open up your airways make it easier to breathe. Your caregiver may also recommend or prescribe an expectorant. It will loosen the mucus to be coughed up. Only take over-the-counter or prescription medicines for pain, discomfort, or fever as directed by your caregiver.   Removing whatever causes the problem (smoking, for example) is critical to preventing the problem from getting worse.   Cough suppressants may be prescribed for relief of cough symptoms.   Inhaled medicines may be prescribed to help with symptoms now and to help prevent problems from returning.   For those with recurrent (chronic) bronchitis, there may be a need for steroid medicines.  SEEK IMMEDIATE MEDICAL CARE IF:  During treatment, you develop more pus-like mucus  (purulent sputum).   You or your child has an oral temperature above 100.5, not controlled by medicine.   Your baby is older than 3 months with a rectal temperature of 102 F (38.9 C) or higher.   Your baby is 3 months old or younger with a rectal temperature of 100.4 F (38 C) or higher.   You become progressively more ill.   You have increased difficulty breathing, wheezing, or shortness of breath.  It is necessary to seek immediate medical care if you are elderly or sick from any other disease. MAKE SURE YOU:  Understand these instructions.   Will watch your condition.   Will get help right away if you are not doing well or get worse.  Document Released: 01/14/2005 Document Re-Released: 04/10/2009 [employer] Patient Information 2011 [employer]. 

## 2010-05-15 NOTE — Assessment & Plan Note (Signed)
His BP is well controlled 

## 2010-05-15 NOTE — Progress Notes (Signed)
Subjective:    Patient ID: Alfred Armstrong, male    DOB: [birth year], 77 y.o.   MRN: [id number]  Cough This is a chronic problem. The current episode started more than 1 month ago. The problem has been gradually worsening. The problem occurs every few hours. The cough is productive of brown sputum. Pertinent negatives include no chest pain, chills, ear congestion, ear pain, fever, headaches, heartburn, hemoptysis, myalgias, nasal congestion, postnasal drip, rash, rhinorrhea, sore throat, shortness of breath, sweats, weight loss or wheezing. The symptoms are aggravated by nothing. Risk factors for lung disease include smoking/tobacco exposure. He has tried OTC cough suppressant for the symptoms. The treatment provided no relief. His past medical history is significant for COPD and emphysema. There is no history of asthma, bronchiectasis, bronchitis, environmental allergies or pneumonia.      Review of Systems  Constitutional: Negative for fever, chills, weight loss, diaphoresis, activity change, appetite change, fatigue and unexpected weight change.  HENT: Negative for ear pain, congestion, sore throat, facial swelling, rhinorrhea, sneezing, trouble swallowing, neck pain, neck stiffness, voice change, postnasal drip and sinus pressure.   Eyes: Negative for pain and itching.  Respiratory: Positive for cough. Negative for hemoptysis, choking, chest tightness, shortness of breath, wheezing and stridor.   Cardiovascular: Negative for chest pain, palpitations and leg swelling.  Gastrointestinal: Negative for heartburn, nausea, vomiting, abdominal pain, diarrhea, constipation, blood in stool and abdominal distention.  Genitourinary: Negative for dysuria, urgency, frequency, hematuria, flank pain, decreased urine volume and enuresis.  Musculoskeletal: Negative for myalgias, back pain, joint swelling, arthralgias and gait problem.  Skin: Negative for color change, pallor and rash.  Neurological: Negative  for dizziness, tremors, seizures, facial asymmetry, speech difficulty, weakness, light-headedness, numbness and headaches.  Hematological: Negative for environmental allergies and adenopathy. Does not bruise/bleed easily.  Psychiatric/Behavioral: Negative for behavioral problems and agitation. The patient is not nervous/anxious.        Objective:   Physical Exam  Constitutional: He is oriented to person, place, and time. He appears well-developed and well-nourished. No distress.  HENT:  Head: Normocephalic and atraumatic.  Right Ear: External ear normal.  Left Ear: External ear normal.  Nose: Nose normal.  Mouth/Throat: Oropharynx is clear and moist. No oropharyngeal exudate.  Eyes: Conjunctivae and EOM are normal. Pupils are equal, round, and reactive to light. Right eye exhibits no discharge. Left eye exhibits no discharge. No scleral icterus.  Neck: Normal range of motion. Neck supple. No JVD present. No tracheal deviation present. No thyromegaly present.  Cardiovascular: Normal rate, regular rhythm, normal heart sounds and intact distal pulses.  Exam reveals no gallop and no friction rub.   No murmur heard. Pulmonary/Chest: Effort normal and breath sounds normal. No respiratory distress. He has no wheezes. He has no rales. He exhibits no tenderness.  Abdominal: Soft. Bowel sounds are normal. He exhibits no distension and no mass. There is no tenderness. There is no rebound and no guarding.  Musculoskeletal: Normal range of motion. He exhibits no edema and no tenderness.  Lymphadenopathy:    He has no cervical adenopathy.  Neurological: He is oriented to person, place, and time. He has normal reflexes. No cranial nerve deficit. He exhibits normal muscle tone. Coordination normal.  Skin: Skin is warm and dry. No rash noted. He is not diaphoretic. No erythema. No pallor.  Psychiatric: He has a normal mood and affect. His behavior is normal. Judgment and thought content normal.         Lab Results  Component  Value Date   WBC 8.4 01/04/2010   HGB 15.1 01/04/2010   HCT 43.9 01/04/2010   PLT 214.0 01/04/2010   CHOL 167 01/04/2010   TRIG 122.0 01/04/2010   HDL 49.80 01/04/2010   ALT 27 09/11/2009   AST 24 09/11/2009   NA 140 01/04/2010   K 4.2 01/04/2010   CL 106 01/04/2010   CREATININE 1.0 01/04/2010   BUN 19 01/04/2010   CO2 26 01/04/2010   TSH 0.96 02/10/2009   PSA 2.50 05/31/2009     Assessment & Plan:

## 2010-05-15 NOTE — Assessment & Plan Note (Addendum)
It sounds like he is having AE symptoms so I will start Spiriva and I have given him samples, also he can take zutripro prn for the cough and ceftin for infection

## 2010-05-30 ENCOUNTER — Telehealth: Payer: Self-pay

## 2010-05-30 NOTE — Telephone Encounter (Signed)
Patient called stating that he was given an antibiotic for his cough and congestion previously. His cough has become less frequent but it is still thick,creamy, and non-productive. He picked up refill # 2 (has 12 pills left) and wants to know if he should complete this and proceed with the last refill should he not be better.  Spoke with MD who advised that pt should complete all of the second dosage and follow up if not better.

## 2010-06-12 NOTE — Procedures (Signed)
CAROTID DUPLEX EXAM   INDICATION:  Follow up carotid disease.   HISTORY:  Diabetes:  No  Cardiac:  CABG in 1998.  Hypertension:  Yes  Smoking:  Previous  Previous Surgery:  No.  Previous arteriogram in May 2010.  CV History:  Stroke in 1996 with aphasia, currently no symptoms.  Aphasia No, Paresthesias No, Hemiparesis No                                       RIGHT             LEFT  Brachial systolic pressure:         130               132  Brachial Doppler waveforms:         triphasic         triphasic  Vertebral direction of flow:        antegrade         retrograde  DUPLEX VELOCITIES (cm/sec)  CCA peak systolic                   101               occluded  ECA peak systolic                   223               retro, 65  ICA peak systolic                   251               113  ICA end diastolic                   104               45  PLAQUE MORPHOLOGY:                  mixed             mixed  PLAQUE AMOUNT:                      moderate to severe                  severe  PLAQUE LOCATION:                    bifurcation, ICA, ECA               CCA, ICA, ECA   IMPRESSION:  1. Known occluded left common carotid artery.  2. Known left external carotid artery to internal carotid artery      steal.  3. Known left vertebral retrograde flow.  4. High-grade 60% to 79% right internal carotid artery stenosis.  5. Right external carotid artery stenosis.   ___________________________________________  Di Kindle. Edilia Bo, M.D.   CJ/MEDQ  D:  02/08/2009  T:  02/08/2009  Job:  [id number]

## 2010-06-12 NOTE — Procedures (Signed)
CAROTID DUPLEX EXAM   INDICATION:  Bilateral carotid bruit.   HISTORY:  Diabetes:  No.  Cardiac:  Coronary artery bypass graft in 1998.  Hypertension:  Yes.  Smoking:  Quit 15 years ago.  Previous Surgery:  No.  CV History:  On May 13, 2008, the patient had a duplex which suggested  80% to 99% right ICA stenosis and occluded left ICA.  Patient had a  stroke in 1986 characterized by aphasia.  Amaurosis Fugax No, Paresthesias No, Hemiparesis No.                                       RIGHT             LEFT  Brachial systolic pressure:         166               160  Brachial Doppler waveforms:         Triphasic         Triphasic  Vertebral direction of flow:        Antegrade         Antegrade  DUPLEX VELOCITIES (cm/sec)  CCA peak systolic                   80                Occluded  ECA peak systolic                   224               Retrograde, 55  ICA peak systolic                   287               *72  ICA end diastolic                   60                *17  PLAQUE MORPHOLOGY:                  Calcified         Soft  PLAQUE AMOUNT:                      Moderate          Severe  PLAQUE LOCATION:                    Proximal ICA, ECA Throughout CCA   IMPRESSION:  1. Right ECA stenosis.  2. A 60% to 79% right ICA stenosis.  3. Occluded left common carotid artery.  4. *Left internal carotid artery fills via a retrograde left external      carotid artery.   ___________________________________________  P. Liliane Bade, M.D.   MC/MEDQ  D:  06/09/2008  T:  06/09/2008  Job:  [id number]

## 2010-06-12 NOTE — Consult Note (Signed)
VASCULAR SURGERY CONSULTATION   BARTOW, ZYLSTRA  DOB:  [birth year]                                       06/09/2008  IZTIW#:[id number]   REFERRING PHYSICIAN:  Elwin Mocha, MD, [facility].   REFERRAL DIAGNOSIS:  Carotid artery occlusive disease.   HISTORY:  The patient is a 77 year old gentleman who suffered a  nondisabling stroke in 1986.  This apparently affected his left brain  with resultant aphasia.  He recovered from this well.  He has had no  further stroke episodes.   He has undergone coronary artery bypass in 1998 in [place],  [place].   Risk factors for cerebrovascular disease include documented coronary  artery disease, remote tobacco use, hyperlipidemia and hypertension.   PAST MEDICAL HISTORY:  1. Coronary artery disease.  2. Hypertension.  3. COPD.  4. Hyperlipidemia.  5. Cerebrovascular disease.   MEDICATIONS:  1. Simvastatin 20 mg daily.  2. Niacin 250 mg daily.  3. Atenolol 25 mg daily.  4. Metanx one tablet daily.  5. Zolpidem 10 mg daily.  6. Tramadol/APAP p.r.n.  7. Advair 100/50 daily.  8. Nasonex p.r.n.  9. MK7 90 mcg daily.  10.Gamma E 400 units daily.  11.Vitamin D 1000 international units daily.  12.Aspirin 325 mg daily.  13.Super B complex 1 tablet daily.  14.Glucosamine 1500 mg daily.  15.Fish oil 1200 mg b.i.d.  16.Calcium citrate 2 tablets daily.  17.Vitamin C 1000 mg daily.  18.Ranitidine p.r.n.  19.Ephedrine PE twice daily p.r.n.   ALLERGIES:  None known.   FAMILY HISTORY:  Noncontributory.   SOCIAL HISTORY:  The patient is single.  He is retired.  No current  tobacco use.  Does not use alcohol products.  Discontinued tobacco use  15 years ago.   REVIEW OF SYSTEMS:  Refer to patient encounter form.  The patient does  note shortness of breath with exertion.  A productive cough.  Bronchitis  with wheezing.  Occasional hiatal hernia problems.  Pain in his legs  with walking.   PHYSICAL  EXAM:  General:  A 77 year old male appears his stated age.  Alert and oriented.  Vital signs:  BP 161/87, pulse 66 per minute.  HEENT:  Unremarkable.  Neck:  Supple.  No thyromegaly or adenopathy.  Cardiovascular:  Normal heart sounds without murmurs.  Regular rate and  rhythm.  No gallops or rubs.  No carotid bruits.  Chest:  Equal entry  bilaterally.  No rales or rhonchi.  Abdomen:  Soft, nontender.  No  masses or organomegaly.  Neurological:  Cranial nerves intact.  Strength  equal bilaterally.  1+ reflexes.  Gait normal.  Skin:  Intact without  rash or ulceration.   INVESTIGATIONS:  Carotid Doppler evaluation reveals moderate to severe  right ICA stenosis with velocities 287/60 cm/sec.  There is an occluded  left common carotid artery, however, the carotid bifurcation is open  with retrograde flow through the external carotid and antegrade flow in  the left internal carotid artery.   IMPRESSION:  1. Cerebrovascular disease with left common carotid occlusion and      antegrade left internal carotid artery flow.  2. Coronary artery disease.  3. Hyperlipidemia.  4. Chronic obstructive pulmonary disease.  5. Hypertension.   RECOMMENDATIONS:  The patient will be scheduled for catheter-based  cerebral arteriogram to further evaluate his cerebral circulation.  If  indeed his left internal carotid artery is patent he may be a candidate  for left subclavian to carotid bypass for maintenance of antegrade flow  in his left carotid system.   Continue other current preventive measures.  Follow up following  cerebral arteriography.   Balinda Quails, M.D.  Electronically Signed  PGH/MEDQ  D:  06/09/2008  T:  06/10/2008  Job:  [id number]   cc:   Elwin Mocha, MD

## 2010-06-12 NOTE — Procedures (Signed)
CAROTID DUPLEX EXAM   INDICATION:  Carotid artery disease with known left CCA occlusion.   HISTORY:  Diabetes:  No.  Cardiac:  CABG.  Hypertension:  Yes.  Smoking:  Previous.  Previous Surgery:  No.  CV History:  Stroke in 1996, asymptomatic now.  Amaurosis Fugax No, Paresthesias No, Hemiparesis No.                                       RIGHT             LEFT  Brachial systolic pressure:         146               144  Brachial Doppler waveforms:         WNL               WNL  Vertebral direction of flow:        Antegrade         Antegrade  DUPLEX VELOCITIES (cm/sec)  CCA peak systolic                   M = 104, D =381   Occluded  ECA peak systolic                   318               107  ICA peak systolic                   259               No flow visualized  ICA end diastolic                   76  PLAQUE MORPHOLOGY:                  Calcific          Mixed  PLAQUE AMOUNT:                      Moderate/severe   Severe  PLAQUE LOCATION:                    ICA/ECA/CCA       CCA/ICA/ECA   IMPRESSION:  1. Right internal carotid artery shows evidence of 60% to 79% stenosis      and appears stable.  2. Right distal common carotid artery stenosis extended into the      origin of the internal carotid artery and external carotid artery.  3. Right external carotid artery stenosis.  4. Left common carotid artery known occlusion.  5. Focal area of flow visualized on the left, possibly external      carotid artery.  6. Unable to determine the patency of the left internal carotid      artery.   ___________________________________________  Di Kindle. Edilia Bo, M.D.   AS/MEDQ  D:  09/14/2009  T:  09/14/2009  Job:  [id number]   cc:   Sanda Linger, MD

## 2010-06-12 NOTE — Consult Note (Signed)
[name], Alfred Armstrong               ACCOUNT NO.:  [id number]   MEDICAL RECORD NO.:  [id number]          PATIENT TYPE:  AMB   LOCATION:                               FACILITY:  [facility]   PHYSICIAN:  Marin Roberts, MDDATE OF BIRTH:  10-13-1931   DATE OF CONSULTATION:  DATE OF DISCHARGE:                                 CONSULTATION   REFERRING PHYSICIAN:  Balinda Quails, MD   REASON FOR CONSULTATION:  Intracranial interpretation of intracranial  imaging performed during right carotid arteriogram on Jun 20, 2008.   FINDINGS:  There is minimal irregularity to the precavernous right  internal carotid artery.  The internal carotid artery is otherwise  within normal limits.  The A1 and M1 segments are normal.  The anterior  communicating artery is patent with a retrograde flow into the left A1  segment.  There is robust opacification of the left anterior cerebral  artery branches from this injection.  The left MCA branches are slightly  more delayed with potential stenosis at the proximal left M1 segment.  The right MCA branches are within normal limits.   IMPRESSION:  1. Minimal irregularity of the precavernous right internal carotid      artery without significant stenosis.  2. Cross flow of the anterior communicating artery to fill the left A1      segment in a retrograde fashion with some contrast into the middle      cerebral artery.  The left middle cerebral artery filling is      somewhat delayed.  The findings are compatible with a known      proximal common carotid artery occlusion.  3. Possible stenosis of the proximal L M1 segment.      Marin Roberts, MD  Electronically Signed     CM/MEDQ  D:  07/07/2008  T:  07/08/2008  Job:  [id number]

## 2010-06-12 NOTE — Cardiovascular Report (Signed)
NAMEBRAEDYN, KAUK NO.:  [id number]   MEDICAL RECORD NO.:  [id number]          PATIENT TYPE:  OIB   LOCATION:  2899                         FACILITY:  [facility]   PHYSICIAN:  Nanetta Batty, M.D.   DATE OF BIRTH:  07-18-1931   DATE OF PROCEDURE:  DATE OF DISCHARGE:  06/14/2008                            CARDIAC CATHETERIZATION   Alfred Armstrong is a 77 year old married white male with history of CAD,  status post bypass grafting in 1998.  He has a known total left carotid  with high-grade right ICA stenosis followed by duplex ultrasound.  His  other problems include remote tobacco abuse, COPD, dyslipidemia.  He  currently denies chest pain, but does get some dyspnea on exertion.  A  Myoview stress test showed apical ischemia with transient ischemic  dilation, EF of about 50%.  He was referred for diagnostic coronary  arteriography to define his anatomy, risk stratify him before upcoming  cerebral angiography.   PROCEDURE DESCRIPTION:  The patient was brought to the second floor,  [place] cardiac cath lab in the postabsorptive state.  He was  premedicated with p.o. Valium.  His right groin was prepped and shaved  in the usual sterile fashion.  Xylocaine 1% was used for local  anesthesia.  A 6-French sheath was inserted into the left femoral artery  using standard Seldinger technique.  A 6-French right and left Judkins  diagnostic catheter as well as a 6-French pigtail catheter were used for  selective coronary angiography, left ventriculography, selective vein  graft angiography, selective IMA angiography, and supravalvular  aortography.  Visipaque dye was used for the entirety of the case.  Retrograde aortic , left ventricular, and pullback pressures were  recorded.   HEMODYNAMICS:  1. Aortic systolic pressure 154, diastolic pressure 68.  2. Left ventricular systolic pressure 161, end-diastolic pressure 17.   SELECTIVE CORONARY ANGIOGRAPHY:  1. Left main.   Normal.  2. LAD; the proximal LAD was calcified.  There is a 90% stenosis in      the proximal segment just before the takeoff of 2 small to medium      sized diagonal branches.  There was segmental 85%-90% LAD just      beyond the diagonal branches with competitive flow distally.  3. Circumflex; nondominant with a 60% mid AV groove stenosis.  There      is a small first obtuse marginal branch without significant      disease.  4. Right coronary artery; the right coronary artery was a dominant      vessel, which was calcified and diffusely diseased with tandem 70%      proximal mid stenoses as well as 95% stenosis on the origin of the      PDA, which is a small vessel.  5. Vein graft to the PLA.  Widely patent.  6. LIMA to the LAD.  Widely patent.  7. Left ventriculography; RAO left ventriculogram was performed using      25 mL of Visipaque dye at a 12 mL per second.  The overall LVEF was  estimated at approximately 50% with mild global hypokinesia.  8. Supravalvular aortography; performed using 20 mL of Visipaque dye      at 20 mL per second in the LAO view.  I could not visualize any      graft.  I did look for the radial graft to the OM and was unable to      find this, and apparently this was documented to be occluded on the      prior cath.   IMPRESSION:  Alfred Armstrong has patent LIMA to the LAD and vein graft to the  PLA branch.  It is certainly possible that he does have some ischemia in  the diagonal branch territory, however, this does not correlate with his  Myoview findings.  Continued medical therapy will be recommended.   The sheath was removed and pressure was held in the groin to achieve  hemostasis.  The patient left the lab in stable condition.      Nanetta Batty, M.D.  Electronically Signed     JB/MEDQ  D:  06/14/2008  T:  06/15/2008  Job:  [id number]   cc:   Second Floor Cardiac Cath Lab  [facility]  Dr. Joya San

## 2010-06-12 NOTE — Discharge Summary (Signed)
[name], SPILLERS NO.:  [id number]   MEDICAL RECORD NO.:  [id number]          PATIENT TYPE:  OIB   LOCATION:  2899                         FACILITY:  [facility]   PHYSICIAN:  Nanetta Batty, M.D.   DATE OF BIRTH:  November 26, 1931   DATE OF ADMISSION:  06/14/2008  DATE OF DISCHARGE:  06/14/2008                               DISCHARGE SUMMARY   [name] is a 77 year old married white male referred for diagnostic  coronary arteriography.  He has a history of carotid stenosis, found by  duplex ultrasound and has been referred to Dr. Liliane Bade for surgical  evaluation.  As part of an ischemic workup, he had a Myoview stress test  that did show areas of ischemia with transient ischemic dilatation  suggesting high-grade coronary disease.  He is status post coronary  artery bypass grafting x4 in [facility] back in 1998.   His other problems include COPD with remote tobacco abuse and  hyperlipidemia.   MEDICATIONS:  1. Nasacort.  2. Advair.  3. Aspirin.  4. Atenolol.  5. Omega-3.  6. Fish oil.  7. Simcor 500/20.  8. Simvastatin.  9. Niacin.   ALLERGIES:  None.   A 12-point review of systems is negative expect as already noted.   PHYSICAL EXAMINATION:  VITAL SIGNS:  Stable.  LUNGS:  Clear.  HEART:  Carotids are 2+ with bruits bilaterally.  NEUROLOGIC:  The patient is alert, oriented, neurologically intact.  HEART:  Regular rate and rhythm without murmurs, rubs, gallops, or  clicks.  ABDOMEN:  Soft and nontender with active bowel sounds.  There is no  hepatosplenomegaly, organomegaly, or audible bruits.  Femoral arteries  are 2+ without bruits and he has intact pedal pulses.   A 12-lead EKG provided by the patient revealed sinus rhythm, nonspecific  ST and T wave changes.   LABORATORY DATA:  Normal hemogram, normal renal function with a  creatinine at 0.97, potassium of 4.2, and INR is normal.   IMPRESSION:  [name] presents now for diagnostic  coronary  arteriography to define his anatomy and rule out an ischemic etiology  given his recent positive Myoview in anticipation of upcoming vascular  surgery.      Nanetta Batty, M.D.  Electronically Signed     JB/MEDQ  D:  06/14/2008  T:  06/15/2008  Job:  [id number]   cc:   Cardiac Cath Lab  [facility]  Elwin Mocha, M.D.

## 2010-06-12 NOTE — Op Note (Signed)
Alfred Armstrong, Alfred Armstrong               ACCOUNT NO.:  [id number]   MEDICAL RECORD NO.:  [id number]          PATIENT TYPE:  AMB   LOCATION:  SDS                          FACILITY:  [facility]   PHYSICIAN:  Balinda Quails, M.D.    DATE OF BIRTH:  1931/09/15   DATE OF PROCEDURE:  06/20/2008  DATE OF DISCHARGE:  06/20/2008                               OPERATIVE REPORT   PHYSICIAN:  Balinda Quails, MD   DIAGNOSIS:  Extracranial cerebrovascular occlusive disease.   PROCEDURE:  Arch aortogram with right carotid arteriogram.   ACCESS:  Right common femoral artery 5-French sheath.   CONTRAST:  Visipaque 90 mL.   COMPLICATIONS:  None apparent.   CLINICAL NOTE:  Alfred Armstrong is a 77 year old gentleman who was severed a  nondisabling stroke in 18.  This affected his left brain with aphagia.  He recovered well.  No further strokes.  He has undergone a coronary  artery bypass in 1998.   His recent carotid Doppler revealed moderate-to-severe right ICA  stenosis, an occluded left common carotid artery with patent bifurcation  and retrograde flow in the external carotid and antegrade flow in the  left internal carotid artery.  He is brought to the cath lab at this  time for diagnostic workup for cerebral arteriography.   PROCEDURE NOTE:  The patient brought to the cath lab in stable  condition.  Placed in supine position.  Right groin prepped and draped  in sterile fashion.  Skin and subcutaneous tissue instilled with 1%  Xylocaine.  An 18-gauge needle was induced in right common femoral  artery.  A 0.035 Versacore guidewire advanced through the needle into  the mid abdominal aorta.  A 5-French sheath advanced over the guidewire.  The dilator removed.  A pigtail catheter advanced over the guidewire.  The guidewire and pigtail catheter were advanced into the ascending  aorta.  A 40 degree projection at LAO arch aortogram obtained.  This  verified patency of the innominate artery.  The right common  carotid and  proximal right subclavian arteries were widely patent.  There was a  large dominant right vertebral artery with antegrade flow.  Mild  proximal right vertebral stenosis.  The left common carotid artery was  occluded at its origin.  The left vertebral artery revealed a proximal  moderate stenosis which arose directly from the aortic arch.  The  proximal left subclavian artery was patent.   A guidewire then reinserted, and an H1 catheter advanced over the  guidewire.  This was engaged into the innominate artery and then into  the right common carotid artery.   Right carotid arteriography obtained with multiple views, cervical and  intracranial.  Cervical images revealed approximately 50% right internal  carotid artery stenosis.  The right internal carotid artery was patent  to the base of the skull.  Intracranial views interpreted separately by  Neuroradiology.   The catheter was then brought back into the innominate artery, and an  innominate injection made.  This verified antegrade flow again through  the right vertebral artery with a mild proximal stenosis.  The  right  vertebral was a dominant vessel.  There was no significant filling of  the right internal carotid artery via collaterals.   This completed the arteriogram procedure.  The guidewire reinserted, and  the catheter removed.  Right femoral sheath removed.   FINAL IMPRESSION:  1. Normal arch aortogram.  2. Left common carotid artery occlusion.  3. Right internal carotid artery stenosis 50%.   DISPOSITION:  These results will be reviewed further with the patient  and family.  Medical therapy recommended at this time.  We will arrange  followup in the office.      Balinda Quails, M.D.  Electronically Signed     PGH/MEDQ  D:  06/20/2008  T:  06/20/2008  Job:  [id number]   cc:   Elwin Mocha, MD

## 2010-06-12 NOTE — Assessment & Plan Note (Signed)
OFFICE VISIT   MOISE, FRIDAY  DOB:  05/06/1931                                       07/07/2008  ZOXWR#:[id number]   The patient has undergone a cerebral arteriogram.  This reveals a  chronic occlusion of the left common carotid artery and a moderate right  internal carotid artery stenosis at 50%.  I have reviewed these results  with him today.  I do not feel any surgical intervention is required at  present.  We will have to monitor his right internal carotid artery  stenosis to assure this has not progressed and we will plan followup  Doppler in 6 months.   His right groin looks fine.  Intact pulse without evidence of  pseudoaneurysm or bruit.  BP is 135/78, pulse 65 per minute.   Overall the patient is doing well.  We will plan to follow up with him  with a carotid study in 6 months.   Balinda Quails, M.D.  Electronically Signed   PGH/MEDQ  D:  07/07/2008  T:  07/08/2008  Job:  [id number]   cc:   Dr Konrad Felix

## 2010-07-18 ENCOUNTER — Ambulatory Visit (INDEPENDENT_AMBULATORY_CARE_PROVIDER_SITE_OTHER): Payer: No Typology Code available for payment source | Admitting: Internal Medicine

## 2010-07-18 ENCOUNTER — Encounter: Payer: Self-pay | Admitting: Internal Medicine

## 2010-07-18 ENCOUNTER — Other Ambulatory Visit (INDEPENDENT_AMBULATORY_CARE_PROVIDER_SITE_OTHER): Payer: No Typology Code available for payment source

## 2010-07-18 VITALS — BP 146/88 | HR 59 | Temp 97.8°F | Resp 16 | Wt 223.0 lb

## 2010-07-18 DIAGNOSIS — M839 Adult osteomalacia, unspecified: Secondary | ICD-10-CM

## 2010-07-18 DIAGNOSIS — G629 Polyneuropathy, unspecified: Secondary | ICD-10-CM

## 2010-07-18 DIAGNOSIS — G609 Hereditary and idiopathic neuropathy, unspecified: Secondary | ICD-10-CM

## 2010-07-18 DIAGNOSIS — I10 Essential (primary) hypertension: Secondary | ICD-10-CM

## 2010-07-18 DIAGNOSIS — N138 Other obstructive and reflux uropathy: Secondary | ICD-10-CM

## 2010-07-18 DIAGNOSIS — J449 Chronic obstructive pulmonary disease, unspecified: Secondary | ICD-10-CM

## 2010-07-18 DIAGNOSIS — N401 Enlarged prostate with lower urinary tract symptoms: Secondary | ICD-10-CM

## 2010-07-18 DIAGNOSIS — E785 Hyperlipidemia, unspecified: Secondary | ICD-10-CM

## 2010-07-18 LAB — COMPREHENSIVE METABOLIC PANEL
AST: 27 U/L (ref 0–37)
Alkaline Phosphatase: 52 U/L (ref 39–117)
BUN: 25 mg/dL — ABNORMAL HIGH (ref 6–23)
Creatinine, Ser: 1.2 mg/dL (ref 0.4–1.5)
Potassium: 4.5 mEq/L (ref 3.5–5.1)
Total Bilirubin: 1 mg/dL (ref 0.3–1.2)

## 2010-07-18 LAB — CBC WITH DIFFERENTIAL/PLATELET
Basophils Absolute: 0 10*3/uL (ref 0.0–0.1)
Basophils Relative: 0.3 % (ref 0.0–3.0)
Eosinophils Absolute: 0.2 10*3/uL (ref 0.0–0.7)
Lymphocytes Relative: 27.7 % (ref 12.0–46.0)
MCHC: 35 g/dL (ref 30.0–36.0)
Neutrophils Relative %: 60.9 % (ref 43.0–77.0)
Platelets: 219 10*3/uL (ref 150.0–400.0)
RBC: 4.87 Mil/uL (ref 4.22–5.81)

## 2010-07-18 LAB — URINALYSIS, ROUTINE W REFLEX MICROSCOPIC
Bilirubin Urine: NEGATIVE
Nitrite: NEGATIVE
Urobilinogen, UA: 0.2 (ref 0.0–1.0)

## 2010-07-18 LAB — HIV ANTIBODY (ROUTINE TESTING W REFLEX): HIV: NONREACTIVE

## 2010-07-18 LAB — TSH: TSH: 0.61 u[IU]/mL (ref 0.35–5.50)

## 2010-07-18 MED ORDER — L-METHYLFOLATE-B6-B12 3-35-2 MG PO TABS: 1.0000 | ORAL_TABLET | Freq: Two times a day (BID) | ORAL | Status: DC

## 2010-07-18 MED ORDER — TIOTROPIUM BROMIDE MONOHYDRATE 18 MCG IN CAPS: 18.0000 ug | ORAL_CAPSULE | Freq: Every day | RESPIRATORY_TRACT | Status: DC

## 2010-07-18 NOTE — Patient Instructions (Signed)
Neuropathy     You have symptoms of neuropathy. Neuropathy means your peripheral nerves are not working normally. There are many different causes of peripheral nerve disorders. These include:   Injury   Infections   Diabetes   Vitamin deficiency  Poor circulation   Alcoholism   Exposure to toxins   Drug effects   Tumors, as well as liver and kidney disease, can also cause neuropathy. Blood tests and special studies of nerve function may help confirm the diagnosis. The symptoms of neuropathy are similar, no matter what the cause. Some of these are:   Tingling, burning, pain, and numbness in the extremities.   Weakness and loss of muscle tone and size.     Treatment includes adopting healthy life habits. A good diet, vitamin supplements and mild pain medication may be needed. Avoid known toxins such as alcohol, tobacco, and recreational drugs.  Neuropathy symptoms may not improve very quickly. While it can cause chronic symptoms, neuropathy is rarely severe or fatal unless complicated by other diseases. Anti-convulsant medicines are helpful in some types of neuropathy.  Make a follow-up appointment with your caregiver to be sure you are getting better with treatment.  Call your caregiver if you are not better after one week or if you have worse symptoms.      SEEK IMMEDIATE MEDICAL CARE IF:   You have breathing problems.   You have severe or uncontrolled pain.   You notice extreme weakness or you feel faint.     Document Released: 02/22/2004  Document Re-Released: 11/11/2006  [employer] Patient Information 2011 [employer].

## 2010-07-18 NOTE — Progress Notes (Signed)
Subjective:    Patient ID: Alfred Armstrong, male    DOB: 05-Jul-1931, 77 y.o.   MRN: [id number]  HPI He returns c/o worsening numbness in his feet and toes and some burning pain in his lower legs. He tells me that this has been a problem for a while, he tells me that he saw a foot doctor and was told that the blood flow is really good and he does not claudication. He think he may have tried metanx before but he is not sure, he does not recall if lyrica helped when he took it for PHN.    Review of Systems  Constitutional: Negative for fever, chills, diaphoresis, activity change, appetite change, fatigue and unexpected weight change.  HENT: Negative.   Eyes: Negative for photophobia, pain, discharge, redness, itching and visual disturbance.  Respiratory: Positive for shortness of breath (his SOB has worsened over the last week after he ran out of spiriva). Negative for apnea, cough, choking, chest tightness, wheezing and stridor.   Cardiovascular: Negative for chest pain, palpitations and leg swelling.  Gastrointestinal: Negative.   Genitourinary: Negative.   Musculoskeletal: Negative for myalgias, back pain, joint swelling, arthralgias and gait problem.  Skin: Negative for color change, pallor and rash.  Neurological: Positive for numbness. Negative for dizziness, tremors, seizures, syncope, facial asymmetry, speech difficulty, weakness, light-headedness and headaches.  Hematological: Negative for adenopathy. Does not bruise/bleed easily.  Psychiatric/Behavioral: Negative for suicidal ideas, hallucinations, behavioral problems, confusion, sleep disturbance, self-injury, dysphoric mood, decreased concentration and agitation. The patient is nervous/anxious. The patient is not hyperactive.        Objective:   Physical Exam  Vitals reviewed. Constitutional: He is oriented to person, place, and time. He appears well-developed and well-nourished. No distress.  HENT:  Head: Normocephalic and  atraumatic.  Right Ear: External ear normal.  Left Ear: External ear normal.  Nose: Nose normal.  Mouth/Throat: Oropharynx is clear and moist. No oropharyngeal exudate.  Eyes: Conjunctivae and EOM are normal. Pupils are equal, round, and reactive to light. Right eye exhibits no discharge. Left eye exhibits no discharge. No scleral icterus.  Neck: Normal range of motion. Neck supple. No JVD present. No tracheal deviation present. No thyromegaly present.  Cardiovascular: Normal rate, regular rhythm, normal heart sounds and intact distal pulses.  Exam reveals no gallop and no friction rub.   No murmur heard. Pulses:      Carotid pulses are 1+ on the right side, and 1+ on the left side.      Radial pulses are 1+ on the right side, and 1+ on the left side.       Femoral pulses are 1+ on the right side, and 1+ on the left side.      Popliteal pulses are 1+ on the right side, and 1+ on the left side.       Dorsalis pedis pulses are 1+ on the right side, and 1+ on the left side.       Posterior tibial pulses are 1+ on the right side, and 1+ on the left side.  Pulmonary/Chest: Effort normal and breath sounds normal. No stridor. No respiratory distress. He has no wheezes. He has no rales. He exhibits no tenderness.  Abdominal: Soft. Bowel sounds are normal. He exhibits no distension and no mass. There is no tenderness. There is no rebound and no guarding.  Musculoskeletal: Normal range of motion. He exhibits no edema and no tenderness.  Lymphadenopathy:    He has no cervical adenopathy.  Neurological: He is alert and oriented to person, place, and time. He has normal strength. He displays abnormal reflex. He displays no atrophy and no tremor. A sensory deficit (he does not detect a pinprick in his toes) is present. No cranial nerve deficit. He exhibits normal muscle tone. He displays a negative Romberg sign. He displays no seizure activity. Coordination and gait normal. He displays no Babinski's sign on  the right side. He displays no Babinski's sign on the left side.  Reflex Scores:      Tricep reflexes are 1+ on the right side and 1+ on the left side.      Bicep reflexes are 1+ on the right side and 1+ on the left side.      Brachioradialis reflexes are 1+ on the right side and 1+ on the left side.      Patellar reflexes are 1+ on the right side.      Achilles reflexes are 0 on the right side and 0 on the left side. Skin: Skin is warm and dry. No rash noted. He is not diaphoretic. No erythema. No pallor.  Psychiatric: He has a normal mood and affect. His behavior is normal. Judgment and thought content normal.        Lab Results  Component Value Date   WBC 8.4 01/04/2010   HGB 15.1 01/04/2010   HCT 43.9 01/04/2010   PLT 214.0 01/04/2010   CHOL 167 01/04/2010   TRIG 122.0 01/04/2010   HDL 49.80 01/04/2010   ALT 27 09/11/2009   AST 24 09/11/2009   NA 140 01/04/2010   K 4.2 01/04/2010   CL 106 01/04/2010   CREATININE 1.0 01/04/2010   BUN 19 01/04/2010   CO2 26 01/04/2010   TSH 0.96 02/10/2009   PSA 2.50 05/31/2009    Assessment & Plan:

## 2010-07-18 NOTE — Assessment & Plan Note (Signed)
His BP is well controlled, I will check his lytes and renal function today 

## 2010-07-18 NOTE — Assessment & Plan Note (Signed)
Will check PSA and UA

## 2010-07-18 NOTE — Assessment & Plan Note (Signed)
Restart spiriva

## 2010-07-18 NOTE — Assessment & Plan Note (Signed)
I will check his Vitamin D level today

## 2010-07-18 NOTE — Assessment & Plan Note (Signed)
Monitoring labs ordered

## 2010-07-18 NOTE — Assessment & Plan Note (Signed)
I will try metanx again and today will initiate a workup of secondary causes of sensory neuropathy and I have asked him to do NCS/EMG to gain more info about the cause and pattern

## 2010-07-19 ENCOUNTER — Encounter: Payer: Self-pay | Admitting: Internal Medicine

## 2010-07-19 LAB — RPR

## 2010-07-20 LAB — PROTEIN ELECTROPHORESIS, SERUM
Alpha-2-Globulin: 10.3 % (ref 7.1–11.8)
Gamma Globulin: 18.9 % — ABNORMAL HIGH (ref 11.1–18.8)
M-Spike, %: 1.02 g/dL

## 2010-07-22 LAB — METHYLMALONIC ACID, SERUM: Methylmalonic Acid, Quantitative: 99 nmol/L (ref 87–318)

## 2010-07-23 ENCOUNTER — Other Ambulatory Visit: Payer: Self-pay | Admitting: *Deleted

## 2010-07-23 DIAGNOSIS — J449 Chronic obstructive pulmonary disease, unspecified: Secondary | ICD-10-CM

## 2010-07-23 LAB — VITAMIN D 1,25 DIHYDROXY
Vitamin D 1, 25 (OH)2 Total: 34 pg/mL (ref 18–72)
Vitamin D2 1, 25 (OH)2: <8 pg/mL
Vitamin D3 1, 25 (OH)2: 34 pg/mL

## 2010-07-23 MED ORDER — TIOTROPIUM BROMIDE MONOHYDRATE 18 MCG IN CAPS: 18.0000 ug | ORAL_CAPSULE | Freq: Every day | RESPIRATORY_TRACT | Status: DC

## 2010-07-24 ENCOUNTER — Other Ambulatory Visit: Payer: Self-pay | Admitting: Internal Medicine

## 2010-07-24 DIAGNOSIS — R778 Other specified abnormalities of plasma proteins: Secondary | ICD-10-CM | POA: Insufficient documentation

## 2010-07-30 ENCOUNTER — Other Ambulatory Visit: Payer: Self-pay | Admitting: Internal Medicine

## 2010-08-06 ENCOUNTER — Other Ambulatory Visit (HOSPITAL_COMMUNITY): Payer: Self-pay | Admitting: Oncology

## 2010-08-09 ENCOUNTER — Encounter (HOSPITAL_BASED_OUTPATIENT_CLINIC_OR_DEPARTMENT_OTHER): Payer: No Typology Code available for payment source | Admitting: Oncology

## 2010-08-09 ENCOUNTER — Other Ambulatory Visit: Payer: Self-pay | Admitting: Oncology

## 2010-08-09 DIAGNOSIS — D472 Monoclonal gammopathy: Secondary | ICD-10-CM

## 2010-08-09 DIAGNOSIS — D890 Polyclonal hypergammaglobulinemia: Secondary | ICD-10-CM

## 2010-08-09 DIAGNOSIS — M899 Disorder of bone, unspecified: Secondary | ICD-10-CM

## 2010-08-13 ENCOUNTER — Other Ambulatory Visit: Payer: Self-pay | Admitting: Oncology

## 2010-08-13 DIAGNOSIS — C9000 Multiple myeloma not having achieved remission: Secondary | ICD-10-CM

## 2010-08-13 LAB — IMMUNOFIXATION ELECTROPHORESIS: IgG (Immunoglobin G), Serum: 1540 mg/dL (ref 650–1600)

## 2010-08-13 LAB — KAPPA/LAMBDA LIGHT CHAINS: Kappa free light chain: 0.84 mg/dL (ref 0.33–1.94)

## 2010-08-15 ENCOUNTER — Other Ambulatory Visit (HOSPITAL_COMMUNITY): Payer: No Typology Code available for payment source

## 2010-08-15 ENCOUNTER — Ambulatory Visit: Payer: No Typology Code available for payment source | Admitting: Internal Medicine

## 2010-08-15 ENCOUNTER — Ambulatory Visit (HOSPITAL_COMMUNITY)
Admission: RE | Admit: 2010-08-15 | Discharge: 2010-08-15 | Disposition: A | Discharge status: Home / Self Care | Payer: Medicare Other | Source: Ambulatory Visit | Attending: Oncology | Admitting: Oncology

## 2010-08-15 DIAGNOSIS — M899 Disorder of bone, unspecified: Secondary | ICD-10-CM

## 2010-08-15 DIAGNOSIS — M47817 Spondylosis without myelopathy or radiculopathy, lumbosacral region: Secondary | ICD-10-CM | POA: Insufficient documentation

## 2010-08-15 DIAGNOSIS — Z951 Presence of aortocoronary bypass graft: Secondary | ICD-10-CM | POA: Insufficient documentation

## 2010-08-15 DIAGNOSIS — M47812 Spondylosis without myelopathy or radiculopathy, cervical region: Secondary | ICD-10-CM | POA: Insufficient documentation

## 2010-08-15 DIAGNOSIS — Z0389 Encounter for observation for other suspected diseases and conditions ruled out: Secondary | ICD-10-CM | POA: Insufficient documentation

## 2010-08-15 LAB — UIFE/LIGHT CHAINS/TP QN, 24-HR UR
Albumin, U: DETECTED
Free Kappa Lt Chains,Ur: 0.4 mg/dL (ref 0.14–2.42)
Free Kappa/Lambda Ratio: 10 ratio (ref 2.04–10.37)
Free Lambda Excretion/Day: 1.16 mg/d
Time: 24 hours

## 2010-08-16 ENCOUNTER — Ambulatory Visit (INDEPENDENT_AMBULATORY_CARE_PROVIDER_SITE_OTHER): Payer: No Typology Code available for payment source | Admitting: Internal Medicine

## 2010-08-16 ENCOUNTER — Encounter: Payer: Self-pay | Admitting: Internal Medicine

## 2010-08-16 VITALS — BP 148/80 | HR 76 | Temp 98.6°F | Resp 16 | Wt 223.0 lb

## 2010-08-16 DIAGNOSIS — G609 Hereditary and idiopathic neuropathy, unspecified: Secondary | ICD-10-CM

## 2010-08-16 DIAGNOSIS — J449 Chronic obstructive pulmonary disease, unspecified: Secondary | ICD-10-CM

## 2010-08-16 DIAGNOSIS — J4489 Other specified chronic obstructive pulmonary disease: Secondary | ICD-10-CM

## 2010-08-16 DIAGNOSIS — R778 Other specified abnormalities of plasma proteins: Secondary | ICD-10-CM

## 2010-08-16 DIAGNOSIS — I10 Essential (primary) hypertension: Secondary | ICD-10-CM

## 2010-08-16 DIAGNOSIS — R799 Abnormal finding of blood chemistry, unspecified: Secondary | ICD-10-CM

## 2010-08-16 DIAGNOSIS — G629 Polyneuropathy, unspecified: Secondary | ICD-10-CM

## 2010-08-16 MED ORDER — NEBIVOLOL HCL 10 MG PO TABS: 10.0000 mg | ORAL_TABLET | Freq: Every day | ORAL | Status: DC

## 2010-08-16 MED ORDER — ROFLUMILAST 500 MCG PO TABS: 500.0000 ug | ORAL_TABLET | Freq: Every day | ORAL | Status: DC

## 2010-08-16 NOTE — Patient Instructions (Signed)
Chronic Obstructive Pulmonary Disease (COPD) Chronic obstructive pulmonary disease (COPD) is a condition in which airflow from the lungs is restricted. The lungs can never return to normal, but there are measures you can take which will improve them and make you feel better. CAUSES  Smoking.   Breathing in irritants (pollution, cigarette smoke, strong odors, aerosol sprays, paint fumes).   History of lung infections.  TREATMENT  Treatment focuses on making you comfortable (supportive care).  HOME CARE INSTRUCTIONS  If you smoke, stop smoking. The carbon monoxide buildup in the blood robs you of your already short oxygen supply.   Take medicines (antibiotics) that kill germs as directed.   Avoid antihistamines and cough syrups. They dry up your system and slow down the elimination of secretions. This decreases respiratory capacity and may lead to infections.   Drink enough water and fluids to keep your urine clear or pale yellow. This loosens secretions.   Use humidifiers at home and at your bedside if they do not make breathing difficult.   Receive all protective vaccines your caregiver suggests, especially pneumococcal and influenza.   Use home oxygen as suggested.  SEEK MEDICAL CARE IF:  You develop pus-like mucus (sputum).   You have an oral temperature above 100.5.   Breathing is more labored or exercise becomes difficult to do.   You are running out of the medicine you take for your breathing.  SEEK IMMEDIATE MEDICAL CARE IF:  You have a rapid heart rate.   You have agitation, confusion, tremors, or are in a stupor (family members may need to observe this).   It becomes difficult to breathe.   You develop chest pain.  You have an oral temperature above 100.5Hypertension (High Blood Pressure) As your heart beats, it forces blood through your arteries. This force is your blood pressure. If the pressure is too high, it is called hypertension (HTN) or high blood  pressure. HTN is dangerous because you may have it and not know it. High blood pressure may mean that your heart has to work harder to pump blood. Your arteries may be narrow or stiff. The extra work puts you at risk for heart disease, stroke, and other problems.  Blood pressure consists of two numbers, a higher number over a lower, 110/72, for example. It is stated as "110 over 72." The ideal is below 120 for the top number (systolic) and under 80 for the bottom (diastolic). Write down your blood pressure today. You should pay close attention to your blood pressure if you have certain conditions such as: Heart failure. Prior heart attack.  Diabetes  Chronic kidney disease.  Prior stroke.  Multiple risk factors for heart disease.   To see if you have HTN, your blood pressure should be measured while you are seated with your arm held at the level of the heart. It should be measured at least twice. A one-time elevated blood pressure reading (especially in the Emergency Department) does not mean that you need treatment. There may be conditions in which the blood pressure is different between your right and left arms. It is important to see your caregiver soon for a recheck. Most people have essential hypertension which means that there is not a specific cause. This type of high blood pressure may be lowered by changing lifestyle factors such as: Stress. Smoking.  Lack of exercise.  Excessive weight. Drug/tobacco/alcohol use.  Eating less salt.   Most people do not have symptoms from high blood pressure until it   has caused damage to the body. Effective treatment can often prevent, delay or reduce that damage. TREATMENT Treatment for high blood pressure, when a cause has been identified, is directed at the cause. There are a large number of medications to treat HTN. These fall into several categories, and your caregiver will help you select the medicines that are best for you. Medications may have side  effects. You should review side effects with your caregiver. If your blood pressure stays high after you have made lifestyle changes or started on medicines,  Your medication(s) may need to be changed.  Other problems may need to be addressed.  Be certain you understand your prescriptions, and know how and when to take your medicine.  Be sure to follow up with your caregiver within the time frame advised (usually within two weeks) to have your blood pressure rechecked and to review your medications.  If you are taking more than one medicine to lower your blood pressure, make sure you know how and at what times they should be taken. Taking two medicines at the same time can result in blood pressure that is too low.  SEEK IMMEDIATE MEDICAL CARE IF YOU DEVELOP: A severe headache, blurred or changing vision, or confusion.  Unusual weakness or numbness, or a faint feeling.  Severe chest or abdominal pain, vomiting, or breathing problems.  MAKE SURE YOU:  Understand these instructions.  Will watch your condition.  Will get help right away if you are not doing well or get worse.  Document Released: 01/14/2005 Document Re-Released: 07/04/2009  [employer] Patient Information 2011 [employer] not controlled by medicine.  MAKE SURE YOU:   Understand these instructions.   Will watch your condition.   Will get help right away if you are not doing well or get worse.  Document Released: 10/24/2004 Document Re-Released: 04/10/2009 [employer] Patient Information 2011 [employer] 

## 2010-08-18 ENCOUNTER — Encounter: Payer: Self-pay | Admitting: Internal Medicine

## 2010-08-18 NOTE — Assessment & Plan Note (Signed)
This has improved with metanx

## 2010-08-18 NOTE — Assessment & Plan Note (Signed)
I will add daliresp to spiriva to see if it will help with his symptoms

## 2010-08-18 NOTE — Assessment & Plan Note (Signed)
He saw Dr. Gaylyn Rong and he had a bone scan done yesterday (no lytic lesions) so he has no criteria c/w MM but it appears that this is MGUS

## 2010-08-18 NOTE — Progress Notes (Signed)
  Subjective:    Patient ID: Alfred Armstrong, male    DOB: 1931-07-17, 77 y.o.   MRN: [id number]  Hypertension This is a chronic problem. The current episode started more than 1 year ago. The problem has been gradually worsening since onset. The problem is uncontrolled. Pertinent negatives include no anxiety, blurred vision, chest pain, headaches, malaise/fatigue, neck pain, orthopnea, palpitations, peripheral edema, PND, shortness of breath or sweats. Past treatments include beta blockers. The current treatment provides moderate improvement. Compliance problems include exercise and diet.       Review of Systems  Constitutional: Negative for fever, chills, malaise/fatigue, diaphoresis, activity change, appetite change, fatigue and unexpected weight change.  HENT: Negative for sore throat, facial swelling, trouble swallowing, neck pain, neck stiffness and voice change.   Eyes: Negative.  Negative for blurred vision.  Respiratory: Positive for cough (intermittent NP cough and some wheezing) and wheezing. Negative for apnea, choking, chest tightness, shortness of breath and stridor.   Cardiovascular: Negative for chest pain, palpitations, orthopnea, leg swelling and PND.  Gastrointestinal: Negative for nausea, vomiting, abdominal pain, diarrhea and constipation.  Genitourinary: Negative.   Musculoskeletal: Negative for myalgias, back pain, joint swelling, arthralgias and gait problem.  Skin: Negative for color change, pallor, rash and wound.  Neurological: Negative for dizziness, tremors, seizures, syncope, facial asymmetry, speech difficulty, weakness, light-headedness, numbness and headaches.  Hematological: Negative for adenopathy. Does not bruise/bleed easily.  Psychiatric/Behavioral: Negative for suicidal ideas, hallucinations, behavioral problems, confusion, sleep disturbance, self-injury, dysphoric mood, decreased concentration and agitation. The patient is not nervous/anxious and is not  hyperactive.        Objective:   Physical Exam  Vitals reviewed. Constitutional: He is oriented to person, place, and time. He appears well-developed and well-nourished. No distress.  HENT:  Head: Normocephalic and atraumatic.  Right Ear: External ear normal.  Left Ear: External ear normal.  Nose: Nose normal.  Mouth/Throat: Oropharynx is clear and moist. No oropharyngeal exudate.  Eyes: Conjunctivae and EOM are normal. Pupils are equal, round, and reactive to light. Right eye exhibits no discharge. Left eye exhibits no discharge. No scleral icterus.  Neck: Normal range of motion. Neck supple. No JVD present. No tracheal deviation present. No thyromegaly present.  Cardiovascular: Normal rate, regular rhythm, normal heart sounds and intact distal pulses.  Exam reveals no gallop and no friction rub.   No murmur heard. Pulmonary/Chest: Effort normal and breath sounds normal. No stridor. No respiratory distress. He has no wheezes. He has no rales. He exhibits no tenderness.  Abdominal: Soft. Bowel sounds are normal. He exhibits no distension and no mass. There is no tenderness. There is no rebound and no guarding.  Musculoskeletal: Normal range of motion. He exhibits no edema and no tenderness.  Lymphadenopathy:    He has no cervical adenopathy.  Neurological: He is alert and oriented to person, place, and time. He has normal reflexes. He displays normal reflexes. No cranial nerve deficit. He exhibits normal muscle tone. Coordination normal.  Skin: Skin is warm and dry. No rash noted. He is not diaphoretic. No erythema. No pallor.  Psychiatric: He has a normal mood and affect. His behavior is normal. Judgment and thought content normal.          Assessment & Plan:

## 2010-08-18 NOTE — Assessment & Plan Note (Signed)
I will increase the dose of bystolic to lower his BP

## 2010-08-22 ENCOUNTER — Other Ambulatory Visit: Payer: Self-pay | Admitting: Oncology

## 2010-08-22 ENCOUNTER — Other Ambulatory Visit: Payer: Self-pay | Admitting: Interventional Radiology

## 2010-08-22 ENCOUNTER — Ambulatory Visit (HOSPITAL_COMMUNITY)
Admission: RE | Admit: 2010-08-22 | Discharge: 2010-08-22 | Disposition: A | Discharge status: Home / Self Care | Payer: Medicare Other | Source: Ambulatory Visit | Attending: Oncology | Admitting: Oncology

## 2010-08-22 ENCOUNTER — Inpatient Hospital Stay (HOSPITAL_COMMUNITY): Admit: 2010-08-22 | Payer: Self-pay

## 2010-08-22 ENCOUNTER — Ambulatory Visit (HOSPITAL_COMMUNITY): Payer: Medicare Other

## 2010-08-22 DIAGNOSIS — E785 Hyperlipidemia, unspecified: Secondary | ICD-10-CM | POA: Insufficient documentation

## 2010-08-22 DIAGNOSIS — Z951 Presence of aortocoronary bypass graft: Secondary | ICD-10-CM | POA: Insufficient documentation

## 2010-08-22 DIAGNOSIS — I10 Essential (primary) hypertension: Secondary | ICD-10-CM | POA: Insufficient documentation

## 2010-08-22 DIAGNOSIS — I2510 Atherosclerotic heart disease of native coronary artery without angina pectoris: Secondary | ICD-10-CM | POA: Insufficient documentation

## 2010-08-22 DIAGNOSIS — Z8673 Personal history of transient ischemic attack (TIA), and cerebral infarction without residual deficits: Secondary | ICD-10-CM | POA: Insufficient documentation

## 2010-08-22 DIAGNOSIS — C9000 Multiple myeloma not having achieved remission: Secondary | ICD-10-CM | POA: Insufficient documentation

## 2010-08-22 DIAGNOSIS — J438 Other emphysema: Secondary | ICD-10-CM | POA: Insufficient documentation

## 2010-08-22 LAB — CBC
Hemoglobin: 14.2 g/dL (ref 13.0–17.0)
MCH: 30.5 pg (ref 26.0–34.0)
MCV: 88.6 fL (ref 78.0–100.0)
Platelets: 191 10*3/uL (ref 150–400)
RBC: 4.66 MIL/uL (ref 4.22–5.81)
WBC: 7.2 10*3/uL (ref 4.0–10.5)

## 2010-08-22 LAB — APTT: aPTT: 26 seconds (ref 24–37)

## 2010-08-22 LAB — PROTIME-INR: Prothrombin Time: 13.5 seconds (ref 11.6–15.2)

## 2010-08-23 ENCOUNTER — Ambulatory Visit (INDEPENDENT_AMBULATORY_CARE_PROVIDER_SITE_OTHER): Payer: Medicare Other | Admitting: Internal Medicine

## 2010-08-23 ENCOUNTER — Encounter: Payer: Self-pay | Admitting: Internal Medicine

## 2010-08-23 VITALS — BP 128/80 | HR 67 | Temp 97.7°F | Resp 16 | Wt 219.0 lb

## 2010-08-23 DIAGNOSIS — K219 Gastro-esophageal reflux disease without esophagitis: Secondary | ICD-10-CM

## 2010-08-23 MED ORDER — ESOMEPRAZOLE MAGNESIUM 40 MG PO CPDR: 40.0000 mg | DELAYED_RELEASE_CAPSULE | Freq: Every day | ORAL | Status: DC

## 2010-08-23 NOTE — Assessment & Plan Note (Signed)
Stop zantac and start nexium for better symptom relief

## 2010-08-23 NOTE — Progress Notes (Signed)
Subjective:    Patient ID: Alfred Armstrong, male    DOB: 13-May-1931, 77 y.o.   MRN: [id number]  Gastrophageal Reflux He complains of belching and heartburn. He reports no abdominal pain, no chest pain, no choking, no coughing, no dysphagia, no early satiety, no globus sensation, no hoarse voice, no nausea, no sore throat, no stridor, no tooth decay, no water brash or no wheezing. This is a recurrent problem. The current episode started 1 to 4 weeks ago. The problem occurs occasionally. The problem has been gradually worsening. The heartburn duration is an hour. The heartburn is located in the substernum. The heartburn is of moderate intensity. The heartburn wakes him from sleep. The heartburn does not limit his activity. The heartburn doesn't change with position. The symptoms are aggravated by nothing. Pertinent negatives include no anemia, fatigue, melena, muscle weakness, orthopnea or weight loss. Risk factors include obesity. He has tried a histamine-2 antagonist for the symptoms. The treatment provided no relief.      Review of Systems  Constitutional: Negative.  Negative for weight loss and fatigue.  HENT: Negative.  Negative for sore throat and hoarse voice.   Eyes: Negative.   Respiratory: Negative.  Negative for cough, choking and wheezing.   Cardiovascular: Negative.  Negative for chest pain, palpitations and leg swelling.  Gastrointestinal: Positive for heartburn and constipation. Negative for dysphagia, nausea, vomiting, abdominal pain, diarrhea, blood in stool, melena, abdominal distention, anal bleeding and rectal pain.  Genitourinary: Negative.   Musculoskeletal: Negative.  Negative for muscle weakness.  Skin: Negative for color change, pallor, rash and wound.  Neurological: Negative.   Hematological: Negative.   Psychiatric/Behavioral: Negative.        Objective:   Physical Exam  Vitals reviewed. Constitutional: He is oriented to person, place, and time. He appears  well-developed and well-nourished. No distress.  HENT:  Head: Normocephalic and atraumatic.  Right Ear: External ear normal.  Left Ear: External ear normal.  Nose: Nose normal.  Mouth/Throat: Oropharynx is clear and moist. No oropharyngeal exudate.  Eyes: Conjunctivae and EOM are normal. Pupils are equal, round, and reactive to light. Right eye exhibits no discharge. No scleral icterus.  Neck: Normal range of motion. Neck supple. No JVD present. No tracheal deviation present. No thyromegaly present.  Cardiovascular: Normal rate, regular rhythm, normal heart sounds and intact distal pulses.  Exam reveals no gallop and no friction rub.   No murmur heard. Pulmonary/Chest: Effort normal and breath sounds normal. No stridor. No respiratory distress. He has no wheezes. He has no rales. He exhibits no tenderness.  Abdominal: Soft. Bowel sounds are normal. He exhibits no distension. There is no tenderness. There is no rebound and no guarding.  Musculoskeletal: Normal range of motion. He exhibits no edema and no tenderness.  Lymphadenopathy:    He has no cervical adenopathy.  Neurological: He is alert and oriented to person, place, and time. He has normal reflexes.  Skin: Skin is warm and dry. No rash noted. He is not diaphoretic. No erythema. No pallor.  Psychiatric: He has a normal mood and affect. His behavior is normal. Judgment and thought content normal.        Lab Results  Component Value Date   WBC 7.2 08/22/2010   HGB 14.2 08/22/2010   HCT 41.3 08/22/2010   PLT 191 08/22/2010   CHOL 167 01/04/2010   TRIG 122.0 01/04/2010   HDL 49.80 01/04/2010   ALT 31 07/18/2010   AST 27 07/18/2010   NA 138  07/18/2010   K 4.5 07/18/2010   CL 104 07/18/2010   CREATININE 1.2 07/18/2010   BUN 25* 07/18/2010   CO2 25 07/18/2010   TSH 0.61 07/18/2010   PSA 2.50 05/31/2009   INR 1.01 08/22/2010   HGBA1C 6.6* 07/18/2010    Assessment & Plan:

## 2010-08-23 NOTE — Patient Instructions (Signed)
Esophagitis (Heartburn) Esophagitis (heartburn) is a painful, burning sensation in the chest. It may feel worse in certain positions, such as lying down or bending over. It is caused by stomach acid backing up into the tube that carries food from the mouth down to the stomach (lower esophagus). TREATMENT There are a number of non-prescription medicines used to treat heartburn, including:  Antacids.   Acid reducers (also called H-2 blockers).   Proton-pump inhibitors.  HOME CARE INSTRUCTIONS  Raise the head of your bead by putting blocks under the legs.   Eat 2-3 hours before going to bed.   Stop smoking.   Try to reach and maintain a healthy weight.   Do not eat just a few very large meals. Instead, eat many smaller meals throughout the day.   Try to identify foods and beverages that make your symptoms worse, and avoid these.   Avoid tight clothing.   Do not exercise right after eating.  SEEK IMMEDIATE MEDICAL CARE IF YOU:  Have severe chest pain that goes down your arm, or into your jaw or neck.   Feel sweaty, dizzy, or lightheaded.   Are short of breath.   Throw up (vomit) blood.   Have difficulty or pain with swallowing.   Have bloody or black, tarry stools.   Have bouts of heartburn more than three times a week for more than two weeks.  Document Released: 02/22/2004 Document Re-Released: 04/10/2009 [employer] Patient Information 2011 [employer]. 

## 2010-08-28 ENCOUNTER — Telehealth: Payer: Self-pay | Admitting: *Deleted

## 2010-08-28 ENCOUNTER — Telehealth: Payer: Self-pay

## 2010-08-28 MED ORDER — ZOLPIDEM TARTRATE 10 MG PO TABS: 10.0000 mg | ORAL_TABLET | Freq: Every evening | ORAL | Status: DC | PRN

## 2010-08-28 NOTE — Telephone Encounter (Signed)
Rx sent via fax and patient notified

## 2010-08-28 NOTE — Telephone Encounter (Signed)
Received refill request for zolpidem 10 mg. Please advise if ok to refill

## 2010-08-28 NOTE — Telephone Encounter (Signed)
Nexium has not helped and he now feels that he has a rash ? Related to nexium. He is req a call back.

## 2010-08-28 NOTE — Telephone Encounter (Signed)
Yes, #30 with 5 rfills

## 2010-08-28 NOTE — Telephone Encounter (Signed)
Spoke w/pt -   1.He has minor red splotchy area on chest, minimal discomfort and will continue to monitor - had thought new shaving cream caused issue and switched back to previous brand. He will call office w/any change in symptoms. Advised to keep area clean & dry, watch for signs of infection, fever, etc and if no improvement will come in for eval.   2. Pt says nexium has not helped - wonders if he has given it enough time. Advised pt to take med in am 30 min prior to eating, look up "gerd" diet - avoid fried & acidic foods, limit caffeine, sm frequent meals, etc. He will improve his diet and call with no change in symptoms, increase or new symptoms.

## 2010-09-12 ENCOUNTER — Other Ambulatory Visit: Payer: No Typology Code available for payment source

## 2010-09-14 ENCOUNTER — Other Ambulatory Visit (INDEPENDENT_AMBULATORY_CARE_PROVIDER_SITE_OTHER): Payer: Medicare Other

## 2010-09-14 DIAGNOSIS — I6529 Occlusion and stenosis of unspecified carotid artery: Secondary | ICD-10-CM

## 2010-09-19 ENCOUNTER — Encounter (HOSPITAL_BASED_OUTPATIENT_CLINIC_OR_DEPARTMENT_OTHER): Payer: Medicare Other | Admitting: Oncology

## 2010-09-19 DIAGNOSIS — D472 Monoclonal gammopathy: Secondary | ICD-10-CM

## 2010-09-19 DIAGNOSIS — D890 Polyclonal hypergammaglobulinemia: Secondary | ICD-10-CM

## 2010-09-21 NOTE — Procedures (Unsigned)
CAROTID DUPLEX EXAM  INDICATION:  Followup carotid stenosis.  HISTORY: Diabetes:  No. Cardiac:  CABG, CAD. Hypertension:  Yes. Smoking:  Previous. Previous Surgery:  No carotid intervention. CV History:  Asymptomatic. Amaurosis Fugax No, Paresthesias No, Hemiparesis No                                      RIGHT             LEFT Brachial systolic pressure:         146               140 Brachial Doppler waveforms:         WNL               WNL Vertebral direction of flow:        Antegrade         Antegrade DUPLEX VELOCITIES (cm/sec) CCA peak systolic                   80                Occluded ECA peak systolic                   266               Occluded ICA peak systolic                   279-P/284-M       84-abnormal ICA end diastolic                   92-P/81-M         18-abnormal PLAQUE MORPHOLOGY:                  Calcified         Occlusive PLAQUE AMOUNT:                      Moderate to severe                  Occlusive PLAQUE LOCATION:                    CCA, ICA, ECA     CCA, ICA, ECA  IMPRESSION: 1. Left common carotid artery and external carotid artery occlusion     present. 2. Right common carotid artery disease present. 3. Right internal carotid artery stenosis present in the 60%-79%     range, this may be overestimated due to compensatory flow. 4. Patent antegrade vertebral arteries bilaterally. 5. Essentially unchanged since previous study on 03/14/2010.      ___________________________________________ Di Kindle. Edilia Bo, M.D.  SH/MEDQ  D:  09/14/2010  T:  09/14/2010  Job:  [id number]

## 2010-10-17 ENCOUNTER — Encounter: Payer: Self-pay | Admitting: Vascular Surgery

## 2010-11-06 LAB — URINE CULTURE
Colony Count: NO GROWTH
Culture: NO GROWTH

## 2010-11-06 LAB — URINALYSIS, ROUTINE W REFLEX MICROSCOPIC
Leukocytes, UA: NEGATIVE
Nitrite: NEGATIVE
Protein, ur: 100 — AB
Urobilinogen, UA: 0.2

## 2010-11-06 LAB — URINE MICROSCOPIC-ADD ON

## 2010-12-14 ENCOUNTER — Encounter: Payer: Self-pay | Admitting: Internal Medicine

## 2010-12-14 ENCOUNTER — Ambulatory Visit (INDEPENDENT_AMBULATORY_CARE_PROVIDER_SITE_OTHER): Payer: Medicare Other | Admitting: Internal Medicine

## 2010-12-14 ENCOUNTER — Ambulatory Visit (INDEPENDENT_AMBULATORY_CARE_PROVIDER_SITE_OTHER)
Admission: RE | Admit: 2010-12-14 | Discharge: 2010-12-14 | Disposition: A | Discharge status: Home / Self Care | Payer: Medicare Other | Source: Ambulatory Visit | Attending: Internal Medicine | Admitting: Internal Medicine

## 2010-12-14 ENCOUNTER — Telehealth: Payer: Self-pay

## 2010-12-14 ENCOUNTER — Other Ambulatory Visit (INDEPENDENT_AMBULATORY_CARE_PROVIDER_SITE_OTHER): Payer: Medicare Other

## 2010-12-14 VITALS — BP 136/80 | HR 61 | Temp 97.7°F | Resp 16 | Ht 72.0 in | Wt 225.0 lb

## 2010-12-14 DIAGNOSIS — E785 Hyperlipidemia, unspecified: Secondary | ICD-10-CM

## 2010-12-14 DIAGNOSIS — N401 Enlarged prostate with lower urinary tract symptoms: Secondary | ICD-10-CM

## 2010-12-14 DIAGNOSIS — I10 Essential (primary) hypertension: Secondary | ICD-10-CM

## 2010-12-14 DIAGNOSIS — G629 Polyneuropathy, unspecified: Secondary | ICD-10-CM

## 2010-12-14 DIAGNOSIS — R10812 Left upper quadrant abdominal tenderness: Secondary | ICD-10-CM

## 2010-12-14 DIAGNOSIS — N138 Other obstructive and reflux uropathy: Secondary | ICD-10-CM

## 2010-12-14 DIAGNOSIS — Z23 Encounter for immunization: Secondary | ICD-10-CM

## 2010-12-14 DIAGNOSIS — G609 Hereditary and idiopathic neuropathy, unspecified: Secondary | ICD-10-CM

## 2010-12-14 LAB — CBC WITH DIFFERENTIAL/PLATELET
Basophils Relative: 0.5 % (ref 0.0–3.0)
Eosinophils Absolute: 0.3 10*3/uL (ref 0.0–0.7)
Eosinophils Relative: 4.2 % (ref 0.0–5.0)
Hemoglobin: 14.6 g/dL (ref 13.0–17.0)
Lymphocytes Relative: 27.2 % (ref 12.0–46.0)
MCHC: 33.9 g/dL (ref 30.0–36.0)
Monocytes Relative: 9.3 % (ref 3.0–12.0)
Neutrophils Relative %: 58.8 % (ref 43.0–77.0)
RBC: 4.62 Mil/uL (ref 4.22–5.81)
WBC: 7.4 10*3/uL (ref 4.5–10.5)

## 2010-12-14 LAB — TSH: TSH: 0.53 u[IU]/mL (ref 0.35–5.50)

## 2010-12-14 LAB — COMPREHENSIVE METABOLIC PANEL
Albumin: 4.2 g/dL (ref 3.5–5.2)
Alkaline Phosphatase: 49 U/L (ref 39–117)
Calcium: 9.6 mg/dL (ref 8.4–10.5)
Chloride: 108 mEq/L (ref 96–112)
Glucose, Bld: 107 mg/dL — ABNORMAL HIGH (ref 70–99)
Potassium: 4.8 mEq/L (ref 3.5–5.1)
Sodium: 141 mEq/L (ref 135–145)
Total Protein: 7.5 g/dL (ref 6.0–8.3)

## 2010-12-14 NOTE — Patient Instructions (Signed)

## 2010-12-14 NOTE — Progress Notes (Signed)
Subjective:    Patient ID: Alfred Armstrong, male    DOB: 1931-06-29, 77 y.o.   MRN: [id number]  Abdominal Pain This is a recurrent problem. The current episode started more than 1 year ago. The onset quality is gradual. The problem occurs intermittently. The problem has been unchanged. The pain is located in the epigastric region. The pain is at a severity of 1/10. The pain is mild. The quality of the pain is a sensation of fullness. The abdominal pain does not radiate. Pertinent negatives include no anorexia, arthralgias, belching, constipation, diarrhea, dysuria, fever, flatus, frequency, headaches, hematochezia, hematuria, melena, myalgias, nausea, vomiting or weight loss. The pain is aggravated by nothing. The pain is relieved by nothing. He has tried nothing for the symptoms.  Hypertension This is a chronic problem. The current episode started more than 1 year ago. The problem has been gradually improving since onset. The problem is controlled. Pertinent negatives include no anxiety, blurred vision, chest pain, headaches, malaise/fatigue, neck pain, orthopnea, palpitations, peripheral edema, PND, shortness of breath or sweats. There are no associated agents to hypertension. Past treatments include beta blockers. The current treatment provides significant improvement. There are no compliance problems.       Review of Systems  Constitutional: Positive for unexpected weight change (weight gain). Negative for fever, chills, weight loss, malaise/fatigue, diaphoresis, activity change, appetite change and fatigue.  HENT: Negative.  Negative for neck pain.   Eyes: Negative.  Negative for blurred vision.  Respiratory: Negative for apnea, cough, shortness of breath, wheezing and stridor.   Cardiovascular: Negative for chest pain, palpitations, orthopnea, leg swelling and PND.  Gastrointestinal: Positive for abdominal pain. Negative for nausea, vomiting, diarrhea, constipation, blood in stool, melena,  hematochezia, abdominal distention, anal bleeding, rectal pain, anorexia and flatus.  Genitourinary: Positive for difficulty urinating (hesitancy and dribbling). Negative for dysuria, urgency, frequency, hematuria, flank pain, decreased urine volume and enuresis.  Musculoskeletal: Negative for myalgias, back pain, joint swelling, arthralgias and gait problem.  Skin: Negative for color change, pallor and rash.  Neurological: Negative for dizziness, facial asymmetry and headaches.  Hematological: Negative for adenopathy. Does not bruise/bleed easily.  Psychiatric/Behavioral: Negative.        Objective:   Physical Exam  Vitals reviewed. Constitutional: He is oriented to person, place, and time. He appears well-developed and well-nourished. No distress.  HENT:  Head: Normocephalic and atraumatic.  Mouth/Throat: Oropharynx is clear and moist. No oropharyngeal exudate.  Eyes: Conjunctivae are normal. Right eye exhibits no discharge. Left eye exhibits no discharge. No scleral icterus.  Neck: Normal range of motion. Neck supple. No JVD present. No tracheal deviation present. No thyromegaly present.  Cardiovascular: Normal rate, regular rhythm, normal heart sounds and intact distal pulses.  Exam reveals no gallop and no friction rub.   No murmur heard. Pulmonary/Chest: Effort normal and breath sounds normal. No stridor. No respiratory distress. He has no wheezes. He has no rales. He exhibits no tenderness.  Abdominal: Soft. Bowel sounds are normal. He exhibits no distension and no mass. There is no tenderness. There is no rebound and no guarding. Hernia confirmed negative in the right inguinal area and confirmed negative in the left inguinal area.  Genitourinary: Testes normal and penis normal. Rectal exam shows no external hemorrhoid, no internal hemorrhoid, no fissure, no mass, no tenderness and anal tone normal. Guaiac negative stool. Prostate is enlarged (1+ smooth bilateral BPH). Prostate is not  tender. Right testis shows no mass, no swelling and no tenderness. Right testis is  descended. Left testis shows no mass, no swelling and no tenderness. Left testis is descended. Uncircumcised. No phimosis, paraphimosis, hypospadias, penile erythema or penile tenderness. No discharge found.  Musculoskeletal: Normal range of motion. He exhibits no edema and no tenderness.  Lymphadenopathy:    He has no cervical adenopathy.       Right: No inguinal adenopathy present.       Left: No inguinal adenopathy present.  Neurological: He is oriented to person, place, and time.  Skin: Skin is warm and dry. No rash noted. He is not diaphoretic. No erythema. No pallor.  Psychiatric: He has a normal mood and affect. His behavior is normal. Judgment and thought content normal.      Lab Results  Component Value Date   WBC 7.2 08/22/2010   HGB 14.2 08/22/2010   HCT 41.3 08/22/2010   PLT 191 08/22/2010   GLUCOSE 113* 07/18/2010   CHOL 167 01/04/2010   TRIG 122.0 01/04/2010   HDL 49.80 01/04/2010   LDLCALC 93 01/04/2010   ALT 31 07/18/2010   AST 27 07/18/2010   NA 138 07/18/2010   K 4.5 07/18/2010   CL 104 07/18/2010   CREATININE 1.2 07/18/2010   BUN 25* 07/18/2010   CO2 25 07/18/2010   TSH 0.61 07/18/2010   PSA 2.50 05/31/2009   INR 1.01 08/22/2010   HGBA1C 6.6* 07/18/2010     Assessment & Plan:

## 2010-12-14 NOTE — Telephone Encounter (Signed)
Per MD call patient and inform additional xray is needed. I called patient, no answer//LMOVM to call back or stop back our office before 5pm.

## 2010-12-16 NOTE — Assessment & Plan Note (Signed)
He is doing well on crestor 

## 2010-12-16 NOTE — Assessment & Plan Note (Signed)
He describes chronic recurrent abd pain, it seems like this is related to the PHN but he says that it feels different over the last few months so today I will check a plain xray to look for a stool burden, etc and will look at some labs to see if there is any organic pathology, I am also concerned about IBS

## 2010-12-16 NOTE — Assessment & Plan Note (Signed)
I will check his PSA today 

## 2010-12-16 NOTE — Assessment & Plan Note (Signed)
His BP is well controlled, I will check his lytes and renal function today 

## 2010-12-16 NOTE — Assessment & Plan Note (Signed)
This has improved.

## 2010-12-17 ENCOUNTER — Other Ambulatory Visit: Payer: Self-pay | Admitting: Internal Medicine

## 2010-12-17 ENCOUNTER — Ambulatory Visit (HOSPITAL_COMMUNITY)
Admission: RE | Admit: 2010-12-17 | Discharge: 2010-12-17 | Disposition: A | Discharge status: Home / Self Care | Payer: Medicare Other | Source: Ambulatory Visit | Attending: Internal Medicine | Admitting: Internal Medicine

## 2010-12-17 DIAGNOSIS — R10812 Left upper quadrant abdominal tenderness: Secondary | ICD-10-CM

## 2010-12-17 DIAGNOSIS — R1011 Right upper quadrant pain: Secondary | ICD-10-CM | POA: Insufficient documentation

## 2010-12-17 NOTE — Telephone Encounter (Signed)
Patient notified and scheduled for xray

## 2010-12-19 ENCOUNTER — Ambulatory Visit: Payer: No Typology Code available for payment source | Admitting: Internal Medicine

## 2011-01-04 ENCOUNTER — Encounter: Payer: Self-pay | Admitting: *Deleted

## 2011-01-04 ENCOUNTER — Encounter: Payer: Self-pay | Admitting: Internal Medicine

## 2011-01-04 ENCOUNTER — Ambulatory Visit (INDEPENDENT_AMBULATORY_CARE_PROVIDER_SITE_OTHER): Payer: Medicare Other | Admitting: Internal Medicine

## 2011-01-04 ENCOUNTER — Ambulatory Visit: Payer: Medicare Other | Admitting: Internal Medicine

## 2011-01-04 VITALS — BP 138/72 | HR 58 | Temp 97.8°F | Resp 16

## 2011-01-04 DIAGNOSIS — R10812 Left upper quadrant abdominal tenderness: Secondary | ICD-10-CM

## 2011-01-04 DIAGNOSIS — R0602 Shortness of breath: Secondary | ICD-10-CM

## 2011-01-04 DIAGNOSIS — I10 Essential (primary) hypertension: Secondary | ICD-10-CM

## 2011-01-04 NOTE — Progress Notes (Signed)
Subjective:    Patient ID: Alfred Armstrong, male    DOB: [birth year], 77 y.o.   MRN: [id number]  Abdominal Pain This is a chronic problem. The current episode started more than 1 month ago. The onset quality is gradual. The problem occurs intermittently. The problem has been unchanged. The pain is located in the LUQ. The pain is at a severity of 2/10. The pain is mild. Quality: intermittent fullness. The abdominal pain does not radiate. Pertinent negatives include no anorexia, arthralgias, belching, constipation, diarrhea, dysuria, fever, flatus, frequency, headaches, hematochezia, hematuria, melena, myalgias, nausea, vomiting or weight loss. The pain is aggravated by nothing. The pain is relieved by nothing. He has tried oral narcotic analgesics (lyrica) for the symptoms. The treatment provided mild relief.      Review of Systems  Constitutional: Negative for fever, chills, weight loss, diaphoresis, activity change, appetite change, fatigue and unexpected weight change.  HENT: Negative.   Eyes: Negative.   Respiratory: Positive for shortness of breath. Negative for cough, chest tightness, wheezing and stridor.   Cardiovascular: Negative for chest pain, palpitations and leg swelling.  Gastrointestinal: Positive for abdominal pain. Negative for nausea, vomiting, diarrhea, constipation, blood in stool, melena, hematochezia, abdominal distention, anal bleeding, rectal pain, anorexia and flatus.  Genitourinary: Negative for dysuria, urgency, frequency, hematuria, flank pain, decreased urine volume, enuresis and difficulty urinating.  Musculoskeletal: Negative for myalgias, back pain, joint swelling, arthralgias and gait problem.  Skin: Negative for color change, pallor, rash and wound.  Neurological: Negative for dizziness, tremors, seizures, syncope, facial asymmetry, speech difficulty, weakness, light-headedness, numbness and headaches.  Hematological: Negative for adenopathy. Does not bruise/bleed  easily.  Psychiatric/Behavioral: Negative for suicidal ideas, hallucinations, behavioral problems, confusion, sleep disturbance, self-injury, dysphoric mood, decreased concentration and agitation. The patient is nervous/anxious. The patient is not hyperactive.        Objective:   Physical Exam  Vitals reviewed. Constitutional: He is oriented to person, place, and time. He appears well-developed and well-nourished. No distress.  HENT:  Head: Normocephalic and atraumatic.  Mouth/Throat: Oropharynx is clear and moist. No oropharyngeal exudate.  Eyes: Conjunctivae are normal. Right eye exhibits no discharge. Left eye exhibits no discharge. No scleral icterus.  Neck: Normal range of motion. Neck supple. No JVD present. No tracheal deviation present. No thyromegaly present.  Cardiovascular: Normal rate, regular rhythm, normal heart sounds and intact distal pulses.  Exam reveals no gallop and no friction rub.   No murmur heard. Pulmonary/Chest: Effort normal and breath sounds normal. No stridor. No respiratory distress. He has no wheezes. He has no rales. He exhibits no tenderness.  Abdominal: Soft. Bowel sounds are normal. He exhibits no distension and no mass. There is no tenderness. There is no rebound and no guarding.  Musculoskeletal: Normal range of motion. He exhibits no edema and no tenderness.  Lymphadenopathy:    He has no cervical adenopathy.  Neurological: He is oriented to person, place, and time.  Skin: Skin is warm and dry. No rash noted. He is not diaphoretic. No erythema. No pallor.  Psychiatric: He has a normal mood and affect. His behavior is normal. Judgment and thought content normal.      Lab Results  Component Value Date   WBC 7.4 12/14/2010   HGB 14.6 12/14/2010   HCT 43.2 12/14/2010   PLT 220.0 12/14/2010   GLUCOSE 107* 12/14/2010   CHOL 167 01/04/2010   TRIG 122.0 01/04/2010   HDL 49.80 01/04/2010   LDLCALC 93 01/04/2010   ALT 21  12/14/2010   AST 21 12/14/2010     NA 141 12/14/2010   K 4.8 12/14/2010   CL 108 12/14/2010   CREATININE 1.0 12/14/2010   BUN 22 12/14/2010   CO2 25 12/14/2010   TSH 0.53 12/14/2010   PSA 3.05 12/14/2010   INR 1.01 08/22/2010   HGBA1C 6.6* 07/18/2010      Assessment & Plan:

## 2011-01-09 ENCOUNTER — Ambulatory Visit: Payer: Medicare Other | Admitting: Cardiology

## 2011-01-09 ENCOUNTER — Other Ambulatory Visit: Payer: Self-pay | Admitting: Oncology

## 2011-01-09 ENCOUNTER — Other Ambulatory Visit (HOSPITAL_BASED_OUTPATIENT_CLINIC_OR_DEPARTMENT_OTHER): Payer: Medicare Other | Admitting: Lab

## 2011-01-09 DIAGNOSIS — D472 Monoclonal gammopathy: Secondary | ICD-10-CM

## 2011-01-09 DIAGNOSIS — D890 Polyclonal hypergammaglobulinemia: Secondary | ICD-10-CM

## 2011-01-09 LAB — CBC WITH DIFFERENTIAL/PLATELET
BASO%: 0.6 % (ref 0.0–2.0)
Basophils Absolute: 0 10*3/uL (ref 0.0–0.1)
EOS%: 4.4 % (ref 0.0–7.0)
MCH: 31.5 pg (ref 27.2–33.4)
MCHC: 33.9 g/dL (ref 32.0–36.0)
MCV: 92.8 fL (ref 79.3–98.0)
MONO%: 9.6 % (ref 0.0–14.0)
RBC: 4.8 10*6/uL (ref 4.20–5.82)
RDW: 13.2 % (ref 11.0–14.6)

## 2011-01-09 LAB — COMPREHENSIVE METABOLIC PANEL
ALT: 20 U/L (ref 0–53)
AST: 19 U/L (ref 0–37)
Albumin: 4.2 g/dL (ref 3.5–5.2)
Alkaline Phosphatase: 48 U/L (ref 39–117)
BUN: 14 mg/dL (ref 6–23)
Potassium: 4.4 mEq/L (ref 3.5–5.3)
Sodium: 139 mEq/L (ref 135–145)

## 2011-01-10 ENCOUNTER — Ambulatory Visit: Payer: Medicare Other | Admitting: Cardiology

## 2011-01-10 LAB — KAPPA/LAMBDA LIGHT CHAINS
Kappa free light chain: 1.42 mg/dL (ref 0.33–1.94)
Kappa:Lambda Ratio: 1.58 (ref 0.26–1.65)

## 2011-01-10 LAB — COMPREHENSIVE METABOLIC PANEL
AST: 19 U/L (ref 0–37)
BUN: 14 mg/dL (ref 6–23)
Calcium: 9.9 mg/dL (ref 8.4–10.5)
Chloride: 106 mEq/L (ref 96–112)
Creatinine, Ser: 1.05 mg/dL (ref 0.50–1.35)

## 2011-01-11 ENCOUNTER — Telehealth: Payer: Self-pay | Admitting: *Deleted

## 2011-01-11 LAB — COMPREHENSIVE METABOLIC PANEL
ALT: 20 U/L (ref 0–53)
ALT: 20 U/L (ref 0–53)
Alkaline Phosphatase: 48 U/L (ref 39–117)
BUN: 14 mg/dL (ref 6–23)
CO2: 24 mEq/L (ref 19–32)
Calcium: 9.9 mg/dL (ref 8.4–10.5)
Chloride: 106 mEq/L (ref 96–112)
Creatinine, Ser: 1.05 mg/dL (ref 0.50–1.35)
Sodium: 139 mEq/L (ref 135–145)
Total Bilirubin: 0.7 mg/dL (ref 0.3–1.2)
Total Protein: 7.2 g/dL (ref 6.0–8.3)

## 2011-01-11 LAB — PROTEIN ELECTROPHORESIS, SERUM
Albumin ELP: 55.3 % — ABNORMAL LOW (ref 55.8–66.1)
Alpha-1-Globulin: 3.9 % (ref 2.9–4.9)
Alpha-1-Globulin: 3.9 % (ref 2.9–4.9)
Alpha-2-Globulin: 10.8 % (ref 7.1–11.8)
Beta 2: 5.3 % (ref 3.2–6.5)
Beta 2: 5.3 % (ref 3.2–6.5)
Beta Globulin: 6.4 % (ref 4.7–7.2)
Beta Globulin: 6.4 % (ref 4.7–7.2)
Gamma Globulin: 18.3 % (ref 11.1–18.8)

## 2011-01-11 LAB — KAPPA/LAMBDA LIGHT CHAINS
Kappa free light chain: 1.42 mg/dL (ref 0.33–1.94)
Kappa:Lambda Ratio: 1.58 (ref 0.26–1.65)

## 2011-01-11 NOTE — Telephone Encounter (Signed)
Message copied by Wende Mott on Fri Jan 11, 2011  5:06 PM ------      Message from: Jethro Bolus T      Created: Fri Jan 11, 2011  1:34 PM       Please call pt.  His abnormal protein is stable.  I'll see him next week to go over result face to face.  Thanks.

## 2011-01-11 NOTE — Telephone Encounter (Signed)
Called pt to give him message from Dr. Gaylyn Rong.  No answer.  Left VM that we will see pt as scheduled on Monday 01/14/11.

## 2011-01-13 ENCOUNTER — Encounter: Payer: Self-pay | Admitting: Internal Medicine

## 2011-01-13 DIAGNOSIS — R0600 Dyspnea, unspecified: Secondary | ICD-10-CM | POA: Insufficient documentation

## 2011-01-13 DIAGNOSIS — R0609 Other forms of dyspnea: Secondary | ICD-10-CM | POA: Insufficient documentation

## 2011-01-13 NOTE — Assessment & Plan Note (Signed)
His BP is well controlled 

## 2011-01-13 NOTE — Assessment & Plan Note (Signed)
This pain is in the same location where he had PHN so that is still my #1 concern, I asked him to try Lyrica more consistently as he was very vague today as to whether or not it helps, he remains concerned and does have MGUS so I asked him to get a CT done to look at his spleen and other organs in that area

## 2011-01-13 NOTE — Assessment & Plan Note (Signed)
His EKG is unchanged today and he has no other s/s so I think this is related to his COPD, he is due for his annual cardiology f/up as well

## 2011-01-14 ENCOUNTER — Ambulatory Visit (HOSPITAL_BASED_OUTPATIENT_CLINIC_OR_DEPARTMENT_OTHER): Payer: Medicare Other | Admitting: Oncology

## 2011-01-14 VITALS — BP 142/79 | HR 58 | Temp 97.1°F | Ht 72.0 in | Wt 224.2 lb

## 2011-01-14 DIAGNOSIS — R7309 Other abnormal glucose: Secondary | ICD-10-CM

## 2011-01-14 DIAGNOSIS — D472 Monoclonal gammopathy: Secondary | ICD-10-CM

## 2011-01-14 DIAGNOSIS — E785 Hyperlipidemia, unspecified: Secondary | ICD-10-CM

## 2011-01-14 NOTE — Progress Notes (Signed)
[facility] OFFICE PROGRESS NOTE  Cc:  Sanda Linger, M.D.  DIAGNOSIS: Monoclonal gammopathy of undermined significance (MGUS) vs. smoldering (nonactive) myeloma.  CURRENT THERAPY:  Watchful observation.  INTERVAL HISTORY: Alfred Armstrong 77 y.o. male returns for regular follow up by himself.  He has intermittent left flank discomfort.  He does not describe this as pain. The discomfort is not related to activity, bowel movement, or eating.  He is still able to do exercise about 3 times a week with elliptical about 45 minutes each time.  He noticed that his stool is getting "tight;" however, he denies hematochezia, melena.  His last colonoscopy was with [facility] in 03/2009 and was reportedly negative per patient.  He denies any anorexia, weight loss, hematuria, flank mass.  Patient denies fatigue, headache, visual changes, confusion, drenching night sweats, palpable lymph node swelling, mucositis, odynophagia, dysphagia, nausea vomiting, jaundice, chest pain, palpitation, shortness of breath, dyspnea on exertion, productive cough, gum bleeding, epistaxis, hematemesis, hemoptysis, abdominal pain, abdominal swelling, early satiety, melena, hematochezia, hematuria, skin rash, spontaneous bleeding, joint swelling, joint pain, heat or cold intolerance, bowel bladder incontinence, back pain, focal motor weakness, paresthesia, depression, suicidal or homocidal ideation, feeling hopelessness.   MEDICAL HISTORY: Past Medical History  Diagnosis Date  . CAD (coronary artery disease)   . COPD (chronic obstructive pulmonary disease)   . Osteoarthrosis, unspecified whether generalized or localized, unspecified site   . History of nephrolithiasis   . GERD (gastroesophageal reflux disease)   . Tubulovillous adenoma polyp of colon     w/HGD 1999  . Hyperlipidemia   . Carotid stenosis     Followed by Dr Madilyn Fireman - Angiogram 5/10 showed left common carotid to be totally occluded and 50% RICA stenosis    . HTN (hypertension)     ACEI Cough  . CVA (cerebral infarction)   . Shingles     SURGICAL HISTORY:  Past Surgical History  Procedure Date  . Coronary artery bypass graft 1989  . Tonsillectomy 1941  . Coronary angioplasty 04/2008  . Carotid artery angiogram 04/2008    MEDICATIONS: Current Outpatient Prescriptions  Medication Sig Dispense Refill  . Ascorbic Acid (VITAMIN C) 1000 MG tablet Take 1,000 mg by mouth daily.        Marland Kitchen aspirin 81 MG EC tablet Take 81 mg by mouth daily. 2 tabs daily      . Calcium Citrate-Vitamin D (CALCIUM CITRATE + PO) Take 2 tablets by mouth daily.        . Cholecalciferol 1000 UNITS tablet Take 1,000 Units by mouth daily.        . Glucosamine-Chondroit-Vit C-Mn (GLUCOSAMINE 1500 COMPLEX) CAPS Take 2 capsules by mouth daily.        . Hypertonic Nasal Wash (SINUS RINSE BOTTLE KIT NA) by Nasal route as directed.        Marland Kitchen l-methylfolate-B6-B12 (METANX) 3-35-2 MG TABS Take 1 tablet by mouth 2 (two) times daily.  60 tablet  0  . mometasone (NASONEX) 50 MCG/ACT nasal spray 2 sprays by Nasal route daily.        . nebivolol (BYSTOLIC) 10 MG tablet Take 1 tablet (10 mg total) by mouth daily.  56 tablet  0  . rosuvastatin (CRESTOR) 20 MG tablet Take 20 mg by mouth daily.        . traMADol-acetaminophen (ULTRACET) 37.5-325 MG per tablet Take 1 tablet by mouth 4 (four) times daily as needed.        . zolpidem (AMBIEN) 10 MG  tablet Take 1 tablet (10 mg total) by mouth at bedtime as needed.  30 tablet  5    ALLERGIES:  is allergic to spiriva handihaler and daliresp.  REVIEW OF SYSTEMS:  The rest of the 14-point review of system was negative.   Filed Vitals:   01/14/11 1339  BP: 142/79  Pulse: 58  Temp: 97.1 F (36.2 C)   Wt Readings from Last 3 Encounters:  01/14/11 224 lb 3.2 oz (101.696 kg)  09/19/10 216 lb 11.2 oz (98.294 kg)  12/14/10 225 lb (102.059 kg)   ECOG Performance status: 0  PHYSICAL EXAMINATION:   General:  well-nourished in no acute  distress.  Eyes:  no scleral icterus.  ENT:  There were no oropharyngeal lesions.  Neck was without thyromegaly.  Lymphatics:  Negative cervical, supraclavicular or axillary adenopathy.  Respiratory: lungs were clear bilaterally without wheezing or crackles.  Cardiovascular:  Regular rate and rhythm, S1/S2, without murmur, rub or gallop.  There was no pedal edema.  GI:  abdomen was soft, flat, nontender, nondistended, without organomegaly.  Muscoloskeletal:  no spinal tenderness of palpation of vertebral spine.  There was no palpable mass in the left flank or pain to percussion.  Skin exam was without echymosis, petichae or rash especially in the left flank.  Neuro exam was nonfocal.  Patient was able to get on and off exam table without assistance.  Gait was normal.  Patient was alerted and oriented.  Attention was good.   Language was appropriate.  Mood was normal without depression.  Speech was not pressured.  Thought content was not tangential.      LABORATORY/RADIOLOGY DATA:  Cr 1.05; Ca 9.9;  SPEP:  Mspike of 0.85.  Normal serum kappa: lambda ratio. WBC 7.5; Hgb 15.1; Plt 218.  IMAGING:   Dg Abd Decub  12/17/2010  *RADIOLOGY REPORT*  Clinical Data: Abdominal pain.  Question free air.  ABDOMEN - 1 VIEW DECUBITUS  Comparison: 12/14/2010  Findings: Right-sided decubitus film shows no evidence for intraperitoneal free air.  Visualized upper abdomen has a nonspecific gas pattern.  Bones are diffusely demineralized.  IMPRESSION: No evidence for intraperitoneal free air.  Original Report Authenticated By: ERIC A. MANSELL, M.D.    ASSESSMENT AND PLAN:  1.   MGUS:  No evidence of disease progression to active myeloma.  He has no end-organ damage.  He has no anemia, renal insufficiency, hypercalcemia or lytic bone lesion.  I recommended him to continue observation.  2.  Left flank discomfort:  Unclear etiology. Plain film was negative.  He had negative colonoscopy in 2011.  He has no hematuria, flank  mass, skin rash.  I advised him that if this discomfort persists or worsens, we may consider further imaging with CT.  3.     Borderline diabetes mellitus.  He is on diet control only.   4.     Hyperlipidemia.  He is on Rosuvastatin and omega fatty acid per PCP,  5.     History of coronary artery disease, status post coronary artery bypass grafting in distant past.   6.     History of smoking.  He quit 20 years ago and he has mild COPD, and is on inhalers. 7.     Primary care:  He had already had a flu shot this year.  8.     Follow up:  With me in a bout 5 months; sooner if problems.

## 2011-01-16 ENCOUNTER — Ambulatory Visit (INDEPENDENT_AMBULATORY_CARE_PROVIDER_SITE_OTHER)
Admission: RE | Admit: 2011-01-16 | Discharge: 2011-01-16 | Disposition: A | Discharge status: Home / Self Care | Payer: Medicare Other | Source: Ambulatory Visit | Attending: Internal Medicine | Admitting: Internal Medicine

## 2011-01-16 DIAGNOSIS — R10812 Left upper quadrant abdominal tenderness: Secondary | ICD-10-CM

## 2011-01-16 MED ORDER — IOHEXOL 300 MG/ML  SOLN
100.0000 mL | Freq: Once | INTRAMUSCULAR | Status: AC | PRN
  Administered 2011-01-16: 100 mL via INTRAVENOUS

## 2011-01-23 ENCOUNTER — Other Ambulatory Visit: Payer: Self-pay | Admitting: Cardiology

## 2011-01-28 ENCOUNTER — Ambulatory Visit: Payer: Medicare Other | Admitting: Oncology

## 2011-01-28 ENCOUNTER — Other Ambulatory Visit: Payer: Medicare Other | Admitting: Lab

## 2011-01-31 ENCOUNTER — Other Ambulatory Visit: Payer: Self-pay | Admitting: Internal Medicine

## 2011-02-01 ENCOUNTER — Ambulatory Visit (INDEPENDENT_AMBULATORY_CARE_PROVIDER_SITE_OTHER): Payer: Medicare Other | Admitting: Internal Medicine

## 2011-02-01 ENCOUNTER — Other Ambulatory Visit (INDEPENDENT_AMBULATORY_CARE_PROVIDER_SITE_OTHER): Payer: Medicare Other

## 2011-02-01 ENCOUNTER — Encounter: Payer: Self-pay | Admitting: Internal Medicine

## 2011-02-01 DIAGNOSIS — E118 Type 2 diabetes mellitus with unspecified complications: Secondary | ICD-10-CM | POA: Insufficient documentation

## 2011-02-01 DIAGNOSIS — R7309 Other abnormal glucose: Secondary | ICD-10-CM

## 2011-02-01 DIAGNOSIS — R739 Hyperglycemia, unspecified: Secondary | ICD-10-CM

## 2011-02-01 DIAGNOSIS — I10 Essential (primary) hypertension: Secondary | ICD-10-CM

## 2011-02-01 DIAGNOSIS — E785 Hyperlipidemia, unspecified: Secondary | ICD-10-CM

## 2011-02-01 LAB — LIPID PANEL
Cholesterol: 119 mg/dL (ref 0–200)
HDL: 51.1 mg/dL
LDL Cholesterol: 48 mg/dL (ref 0–99)
Total CHOL/HDL Ratio: 2
Triglycerides: 101 mg/dL (ref 0.0–149.0)
VLDL: 20.2 mg/dL (ref 0.0–40.0)

## 2011-02-01 LAB — HEMOGLOBIN A1C: Hgb A1c MFr Bld: 6.1 % (ref 4.6–6.5)

## 2011-02-01 MED ORDER — ZOLPIDEM TARTRATE 10 MG PO TABS: 10.0000 mg | ORAL_TABLET | Freq: Every evening | ORAL | Status: DC | PRN

## 2011-02-01 NOTE — Patient Instructions (Signed)

## 2011-02-01 NOTE — Assessment & Plan Note (Signed)
He is due for a FLP today 

## 2011-02-01 NOTE — Assessment & Plan Note (Signed)
I will recheck his a1c

## 2011-02-01 NOTE — Assessment & Plan Note (Signed)
His BP is well controlled 

## 2011-02-01 NOTE — Progress Notes (Signed)
Subjective:    Patient ID: Alfred Armstrong, male    DOB: November 30, 1931, 78 y.o.   MRN: [id number]  Hyperlipidemia This is a chronic problem. The current episode started more than 1 year ago. The problem is controlled. Recent lipid tests were reviewed and are variable. Exacerbating diseases include obesity. He has no history of chronic renal disease, diabetes, hypothyroidism, liver disease or nephrotic syndrome. Factors aggravating his hyperlipidemia include beta blockers. Pertinent negatives include no chest pain, focal sensory loss, focal weakness, leg pain, myalgias or shortness of breath. Current antihyperlipidemic treatment includes statins. The current treatment provides moderate improvement of lipids. Compliance problems include adherence to exercise and adherence to diet.       Review of Systems  Constitutional: Negative for fever, chills, diaphoresis, activity change, appetite change, fatigue and unexpected weight change.  HENT: Negative.   Eyes: Negative.   Respiratory: Negative for cough, shortness of breath, wheezing and stridor.   Cardiovascular: Negative for chest pain, palpitations and leg swelling.  Gastrointestinal: Negative for nausea, vomiting, abdominal pain, diarrhea, constipation, blood in stool and abdominal distention.  Genitourinary: Negative.   Musculoskeletal: Negative for myalgias, back pain, joint swelling, arthralgias and gait problem.  Skin: Negative for color change, pallor, rash and wound.  Neurological: Negative.  Negative for focal weakness.  Hematological: Negative.   Psychiatric/Behavioral: Negative.        Objective:   Physical Exam  Vitals reviewed. Constitutional: He is oriented to person, place, and time. He appears well-developed and well-nourished. No distress.  HENT:  Head: Normocephalic and atraumatic.  Mouth/Throat: Oropharynx is clear and moist. No oropharyngeal exudate.  Eyes: Conjunctivae are normal. Right eye exhibits no discharge. Left  eye exhibits no discharge. No scleral icterus.  Neck: Normal range of motion. Neck supple. No JVD present. No tracheal deviation present. No thyromegaly present.  Cardiovascular: Normal rate, regular rhythm, normal heart sounds and intact distal pulses.  Exam reveals no gallop and no friction rub.   No murmur heard. Pulmonary/Chest: Effort normal and breath sounds normal. No stridor. No respiratory distress. He has no wheezes. He has no rales. He exhibits no tenderness.  Abdominal: Soft. Bowel sounds are normal. He exhibits no distension and no mass. There is no tenderness. There is no rebound and no guarding.  Musculoskeletal: Normal range of motion. He exhibits no edema and no tenderness.  Lymphadenopathy:    He has no cervical adenopathy.  Neurological: He is oriented to person, place, and time.  Skin: Skin is warm and dry. No rash noted. He is not diaphoretic. No erythema. No pallor.  Psychiatric: He has a normal mood and affect. His behavior is normal. Judgment and thought content normal.      Lab Results  Component Value Date   WBC 7.5 01/09/2011   HGB 15.1 01/09/2011   HCT 44.6 01/09/2011   PLT 218 01/09/2011   GLUCOSE 110* 01/09/2011   GLUCOSE 110* 01/09/2011   GLUCOSE 110* 01/09/2011   GLUCOSE 110* 01/09/2011   CHOL 167 01/04/2010   TRIG 122.0 01/04/2010   HDL 49.80 01/04/2010   LDLCALC 93 01/04/2010   ALT 20 01/09/2011   ALT 20 01/09/2011   ALT 20 01/09/2011   ALT 20 01/09/2011   AST 19 01/09/2011   AST 19 01/09/2011   AST 19 01/09/2011   AST 19 01/09/2011   NA 139 01/09/2011   NA 139 01/09/2011   NA 139 01/09/2011   NA 139 01/09/2011   K 4.4 01/09/2011   K 4.4 01/09/2011  K 4.4 01/09/2011   K 4.4 01/09/2011   CL 106 01/09/2011   CL 106 01/09/2011   CL 106 01/09/2011   CL 106 01/09/2011   CREATININE 1.05 01/09/2011   CREATININE 1.05 01/09/2011   CREATININE 1.05 01/09/2011   CREATININE 1.05 01/09/2011   BUN 14 01/09/2011   BUN 14 01/09/2011   BUN 14  01/09/2011   BUN 14 01/09/2011   CO2 24 01/09/2011   CO2 24 01/09/2011   CO2 24 01/09/2011   CO2 24 01/09/2011   TSH 0.53 12/14/2010   PSA 3.05 12/14/2010   INR 1.01 08/22/2010   HGBA1C 6.6* 07/18/2010      Assessment & Plan:

## 2011-02-05 ENCOUNTER — Encounter: Payer: Self-pay | Admitting: Cardiology

## 2011-02-05 ENCOUNTER — Ambulatory Visit (INDEPENDENT_AMBULATORY_CARE_PROVIDER_SITE_OTHER): Payer: Medicare Other | Admitting: Cardiology

## 2011-02-05 VITALS — BP 133/80 | HR 60 | Resp 16 | Ht 72.0 in | Wt 229.0 lb

## 2011-02-05 DIAGNOSIS — I2510 Atherosclerotic heart disease of native coronary artery without angina pectoris: Secondary | ICD-10-CM | POA: Insufficient documentation

## 2011-02-05 DIAGNOSIS — E785 Hyperlipidemia, unspecified: Secondary | ICD-10-CM

## 2011-02-05 DIAGNOSIS — I6529 Occlusion and stenosis of unspecified carotid artery: Secondary | ICD-10-CM

## 2011-02-05 DIAGNOSIS — I25810 Atherosclerosis of coronary artery bypass graft(s) without angina pectoris: Secondary | ICD-10-CM

## 2011-02-05 MED ORDER — LOSARTAN POTASSIUM 25 MG PO TABS: 25.0000 mg | ORAL_TABLET | Freq: Every day | ORAL | Status: DC

## 2011-02-05 NOTE — Assessment & Plan Note (Signed)
LDL at goal (< 70) when recently checked.  

## 2011-02-05 NOTE — Progress Notes (Addendum)
PCP: Dr. Yetta Barre  78 yo with history of CAD s/p CABG, carotid stenosis, and COPD presents for cardiology followup.  He had a myoview suggesting ischemia in 5/10 followed by catheterization showing patent LIMA and SVG-PLV. There was a chronic occlusion of the free radial to diagonal graft. There was no evidence by catheterization for lesion causing significant ischemia. Patient additionally had an angiogram for evaluation of carotid disease showing occluded left common carotid and 50% stenosis of RICA. He sees pulmonology for COPD. Echo in 7/11 showed preserved LV systolic function with basal inferoposterior akinesis.   Patient has been doing reasonably well since I last saw him.  He has been diagnosed with MGUS and is followed by Dr. Gaylyn Rong.  He works out on a treadmill or elliptical for up to an hour three times a week at a gym.  No exertional chest pain or dyspnea.  He has been noting a mild pressure-like sensation under his rib cage in the left upper quadrant and epigastric areas of his abdomen.  This is unrelated to exertion.  He is not sure if food makes it worse.  It comes and goes.  He continues to followup with Dr. Edilia Bo for carotid evaluation.   Labs (1/11): BNP 57, TSH normal, LDL 98, HDL 49  Labs (12/12): K 4.4, creatinine 1.05 Labs (1/13): LDL 48, HDL 51  Allergies (verified):  No Known Drug Allergies   Past Medical History:  1. CAD, ARTERY BYPASS GRAFT: s/p CABG in [place] in 1989. Had myoview in 5/10 showing EF 50% and apical ischemia. LHC was done in 5/10 showing 90% pLAD, 90% mLAD, 60% mCFX, 70% pRCA, 70% mRCA, 95% dRCA, SVG-PLV patent, LIMA-LAD patent, no other grafts found by aortic root shot. Free radial-D presumed occluded.  Echo (5/11): EF 55%, mild LVH, basal inferoposterior akinesis, normal RV.  2. COPD (ICD-496): PFTs 9/10 with FVC 65%, FEV1 57%. 3. OSTEOARTHRITIS (ICD-715.90)  4. NEPHROLITHIASIS, HX OF (ICD-V13.01)  5. GERD (ICD-530.81)  6. Tubulovillous adenomatous  colon polyps with HGD 1999  7. Hyperlipidemia  8. Carotid stenosis: Followed by Dr. Edilia Bo. Angiogram 5/10 showed left common carotid to be totally occluded and 50% RICA stenosis.  9. HTN: ACEI cough  10. CVA  11. Shingles  12. MGUS: Followed by Dr. Gaylyn Rong.   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  Alcohol Use - no  Daily Caffeine Use: 2-3 cups weekly   ROS: All systems reviewed and negative except as per HPI.   Current Outpatient Prescriptions  Medication Sig Dispense Refill  . Ascorbic Acid (VITAMIN C) 1000 MG tablet Take 1,000 mg by mouth daily.        Marland Kitchen aspirin 81 MG EC tablet Take 81 mg by mouth daily. 2 tabs daily      . Calcium Citrate-Vitamin D (CALCIUM CITRATE + PO) Take 2 tablets by mouth daily.        . Cholecalciferol 1000 UNITS tablet Take 1,000 Units by mouth daily.        . CRESTOR 20 MG tablet TAKE ONE TABLET BY MOUTH ONE TIME DAILY  30 each  0  . Glucosamine-Chondroit-Vit C-Mn (GLUCOSAMINE 1500 COMPLEX) CAPS Take 2 capsules by mouth daily.        Marland Kitchen  Hypertonic Nasal Wash (SINUS RINSE BOTTLE KIT NA) by Nasal route as directed.        Marland Kitchen l-methylfolate-B6-B12 (METANX) 3-35-2 MG TABS Take 1 tablet by mouth 2 (two) times daily.  60 tablet  0  . mometasone (NASONEX) 50 MCG/ACT nasal spray 2 sprays by Nasal route daily.        . nebivolol (BYSTOLIC) 10 MG tablet Take 1 tablet (10 mg total) by mouth daily.  56 tablet  0  . traMADol-acetaminophen (ULTRACET) 37.5-325 MG per tablet TAKE ONE TABLET BY MOUTH FOUR TIMES DAILY AS NEEDED FOR PAIN  100 tablet  4  . zolpidem (AMBIEN) 10 MG tablet Take 1 tablet (10 mg total) by mouth at bedtime as needed.  30 tablet  5  . losartan (COZAAR) 25 MG tablet Take 1 tablet (25 mg total)  by mouth daily.  30 tablet  6   BP 133/80  Pulse 60  Resp 16  Ht 6' (1.829 m)  Wt 103.874 kg (229 lb)  BMI 31.06 kg/m2 General: NAD Neck: No JVD, no thyromegaly or thyroid nodule.  Lungs: Clear to auscultation bilaterally with normal respiratory effort. CV: Nondisplaced PMI.  Heart regular S1/S2, no S3/S4, no murmur.  No peripheral edema.  Bilateral carotid bruits.  Normal pedal pulses.  Abdomen: Soft, nontender, no hepatosplenomegaly, no distention.  Neurologic: Alert and oriented x 3.  Psych: Normal affect. Extremities: No clubbing or cyanosis.

## 2011-02-05 NOTE — Assessment & Plan Note (Signed)
Has regular followup at [facility] for significant carotid disease.

## 2011-02-05 NOTE — Assessment & Plan Note (Signed)
S/p CABG.  Last cath showed 2/3 patent grafts.  I do not think that the LUQ and epigastric abdominal pain he has is related cardiac-related.  He has no exertional symptoms and works out frequently.  Continue ASA 81, Crestor, and nebivolol.  He was unable to tolerate ACEI due to cough, so I will start him on losartan at 25 mg daily.   BMET in 2 wks.

## 2011-02-05 NOTE — Patient Instructions (Signed)
Start losartan 25mg  daily.  Your physician recommends that you return for lab work in: 2 weeks--BMET 414.05  Your physician wants you to follow-up in: 3 months with Dr Shirlee Latch. (April 2013). You will receive a reminder letter in the mail two months in advance. If you don't receive a letter, please call our office to schedule the follow-up appointment.

## 2011-02-19 ENCOUNTER — Other Ambulatory Visit: Payer: Medicare Other

## 2011-02-21 ENCOUNTER — Other Ambulatory Visit (INDEPENDENT_AMBULATORY_CARE_PROVIDER_SITE_OTHER): Payer: Medicare Other

## 2011-02-21 DIAGNOSIS — I25810 Atherosclerosis of coronary artery bypass graft(s) without angina pectoris: Secondary | ICD-10-CM

## 2011-02-21 LAB — BASIC METABOLIC PANEL
BUN: 20 mg/dL (ref 6–23)
Chloride: 107 mEq/L (ref 96–112)
Potassium: 4.4 mEq/L (ref 3.5–5.1)

## 2011-03-01 ENCOUNTER — Telehealth: Payer: Self-pay | Admitting: Cardiology

## 2011-03-01 NOTE — Telephone Encounter (Signed)
LMTCB

## 2011-03-01 NOTE — Telephone Encounter (Signed)
Pt was put on losartin 25mg , he had a mood change, stopped taking it 2-3 days ago, also had blood work done to check his kidney function after starting med 1-2 weeks ago,  pls call [phone]

## 2011-03-01 NOTE — Telephone Encounter (Signed)
Pt states he doesn't feel exactly right in his head when he takes losartan, almost as if he has a fever but he does not feel sick.  He stopped it several days ago and feels better. I reviewed with Dr Shirlee Latch. He recommended pt restart losartan. Pt to call if he experiences symptoms again after restarting losartan. He will try to check his BP when he restarts losartan.

## 2011-03-03 ENCOUNTER — Other Ambulatory Visit: Payer: Self-pay | Admitting: Internal Medicine

## 2011-03-20 ENCOUNTER — Ambulatory Visit: Payer: No Typology Code available for payment source | Admitting: Vascular Surgery

## 2011-03-20 ENCOUNTER — Other Ambulatory Visit: Payer: No Typology Code available for payment source

## 2011-03-20 ENCOUNTER — Ambulatory Visit: Payer: Self-pay | Admitting: Vascular Surgery

## 2011-03-20 ENCOUNTER — Other Ambulatory Visit: Payer: Self-pay

## 2011-04-09 ENCOUNTER — Telehealth: Payer: Self-pay | Admitting: Internal Medicine

## 2011-04-09 NOTE — Telephone Encounter (Signed)
The pt called and is hoping to be worked in this afternoon.  He states he has a pain in left side which has worsened throughout the day.  Can he be worked in? Or would you prefer him to go to the er?  Thanks!   #[phone]

## 2011-04-10 ENCOUNTER — Ambulatory Visit (INDEPENDENT_AMBULATORY_CARE_PROVIDER_SITE_OTHER): Payer: Medicare Other | Admitting: Internal Medicine

## 2011-04-10 ENCOUNTER — Other Ambulatory Visit (INDEPENDENT_AMBULATORY_CARE_PROVIDER_SITE_OTHER): Payer: Medicare Other

## 2011-04-10 ENCOUNTER — Encounter: Payer: Self-pay | Admitting: Internal Medicine

## 2011-04-10 VITALS — BP 126/72 | HR 60 | Temp 97.0°F | Resp 16 | Wt 227.0 lb

## 2011-04-10 DIAGNOSIS — I10 Essential (primary) hypertension: Secondary | ICD-10-CM

## 2011-04-10 DIAGNOSIS — R739 Hyperglycemia, unspecified: Secondary | ICD-10-CM

## 2011-04-10 DIAGNOSIS — R7309 Other abnormal glucose: Secondary | ICD-10-CM

## 2011-04-10 DIAGNOSIS — R10812 Left upper quadrant abdominal tenderness: Secondary | ICD-10-CM

## 2011-04-10 DIAGNOSIS — E785 Hyperlipidemia, unspecified: Secondary | ICD-10-CM

## 2011-04-10 DIAGNOSIS — N200 Calculus of kidney: Secondary | ICD-10-CM

## 2011-04-10 LAB — CBC WITH DIFFERENTIAL/PLATELET
Basophils Absolute: 0.1 10*3/uL (ref 0.0–0.1)
Eosinophils Relative: 2.4 % (ref 0.0–5.0)
HCT: 44.5 % (ref 39.0–52.0)
Lymphs Abs: 2 10*3/uL (ref 0.7–4.0)
MCV: 93.3 fl (ref 78.0–100.0)
Monocytes Absolute: 0.7 10*3/uL (ref 0.1–1.0)
Monocytes Relative: 7.9 % (ref 3.0–12.0)
Neutrophils Relative %: 65.1 % (ref 43.0–77.0)
Platelets: 218 10*3/uL (ref 150.0–400.0)
RDW: 12.8 % (ref 11.5–14.6)
WBC: 8.4 10*3/uL (ref 4.5–10.5)

## 2011-04-10 LAB — COMPREHENSIVE METABOLIC PANEL
ALT: 21 U/L (ref 0–53)
Albumin: 4.2 g/dL (ref 3.5–5.2)
CO2: 25 mEq/L (ref 19–32)
GFR: 72.73 mL/min (ref >60.00)
Glucose, Bld: 124 mg/dL — ABNORMAL HIGH (ref 70–99)
Potassium: 4.5 mEq/L (ref 3.5–5.1)
Sodium: 138 mEq/L (ref 135–145)
Total Protein: 7.6 g/dL (ref 6.0–8.3)

## 2011-04-10 LAB — URINALYSIS, ROUTINE W REFLEX MICROSCOPIC
Bilirubin Urine: NEGATIVE
Hgb urine dipstick: NEGATIVE
Ketones, ur: NEGATIVE
Total Protein, Urine: NEGATIVE
Urine Glucose: NEGATIVE
Urobilinogen, UA: 0.2 (ref 0.0–1.0)

## 2011-04-10 LAB — HEMOGLOBIN A1C: Hgb A1c MFr Bld: 6.3 % (ref 4.6–6.5)

## 2011-04-10 NOTE — Patient Instructions (Signed)

## 2011-04-10 NOTE — Progress Notes (Signed)
Subjective:    Patient ID: Alfred Armstrong, male    DOB: 23-Feb-1931, 78 y.o.   MRN: [id number]  Abdominal Pain This is a recurrent problem. The current episode started more than 1 year ago. The onset quality is gradual. The problem occurs intermittently. The most recent episode lasted 4 days. The problem has been unchanged. The pain is located in the LUQ. The pain is at a severity of 2/10. The pain is mild. The quality of the pain is aching. The abdominal pain does not radiate. Associated symptoms include constipation. Pertinent negatives include no anorexia, arthralgias, belching, diarrhea, dysuria, fever, flatus, frequency, headaches, hematochezia, hematuria, melena, myalgias, nausea, vomiting or weight loss. The pain is aggravated by palpation. The pain is relieved by nothing. He has tried nothing for the symptoms. Prior diagnostic workup includes CT scan. His past medical history is significant for GERD.      Review of Systems  Constitutional: Negative for fever, chills, weight loss, diaphoresis, activity change, appetite change, fatigue and unexpected weight change.  HENT: Negative.   Eyes: Negative.   Respiratory: Negative for cough, chest tightness, shortness of breath, wheezing and stridor.   Cardiovascular: Negative for chest pain, palpitations and leg swelling.  Gastrointestinal: Positive for abdominal pain and constipation. Negative for nausea, vomiting, diarrhea, blood in stool, melena, hematochezia, abdominal distention, anal bleeding, rectal pain, anorexia and flatus.  Genitourinary: Negative for dysuria, urgency, frequency, hematuria, flank pain, decreased urine volume, discharge, penile swelling, scrotal swelling, enuresis, difficulty urinating, genital sores, penile pain and testicular pain.  Musculoskeletal: Negative for myalgias, back pain, joint swelling, arthralgias and gait problem.  Skin: Negative for color change, pallor, rash and wound.  Neurological: Negative for  dizziness, tremors, seizures, syncope, facial asymmetry, speech difficulty, weakness, light-headedness, numbness and headaches.  Hematological: Negative for adenopathy. Does not bruise/bleed easily.  Psychiatric/Behavioral: Negative.        Objective:   Physical Exam  Vitals reviewed. Constitutional: He is oriented to person, place, and time. He appears well-developed and well-nourished. No distress.  HENT:  Head: Normocephalic and atraumatic.  Mouth/Throat: Oropharynx is clear and moist. No oropharyngeal exudate.  Eyes: Conjunctivae are normal. Right eye exhibits no discharge. Left eye exhibits no discharge. No scleral icterus.  Neck: Normal range of motion. Neck supple. No JVD present. No tracheal deviation present. No thyromegaly present.  Cardiovascular: Normal rate, regular rhythm, normal heart sounds and intact distal pulses.  Exam reveals no gallop and no friction rub.   No murmur heard. Pulmonary/Chest: Effort normal and breath sounds normal. No stridor. No respiratory distress. He has no wheezes. He has no rales. He exhibits no tenderness.  Abdominal: Soft. Normal appearance and bowel sounds are normal. He exhibits no shifting dullness, no distension, no pulsatile liver, no fluid wave, no abdominal bruit, no ascites, no pulsatile midline mass and no mass. There is no hepatosplenomegaly. There is no tenderness. There is no rigidity, no rebound, no guarding, no CVA tenderness, no tenderness at McBurney's point and negative Murphy's sign. No hernia. Hernia confirmed negative in the ventral area, confirmed negative in the right inguinal area and confirmed negative in the left inguinal area.  Genitourinary: Rectum normal, prostate normal, testes normal and penis normal. Rectal exam shows no external hemorrhoid, no internal hemorrhoid, no fissure, no mass, no tenderness and anal tone normal. Guaiac negative stool. Prostate is not enlarged and not tender. Right testis shows no mass, no swelling  and no tenderness. Right testis is descended. Left testis shows no mass, no swelling  and no tenderness. Left testis is descended. Uncircumcised. No phimosis, paraphimosis, hypospadias, penile erythema or penile tenderness. No discharge found.  Musculoskeletal: Normal range of motion. He exhibits no edema and no tenderness.  Lymphadenopathy:    He has no cervical adenopathy.       Right: No inguinal adenopathy present.       Left: No inguinal adenopathy present.  Neurological: He is oriented to person, place, and time.  Skin: Skin is warm and dry. No rash noted. He is not diaphoretic. No erythema. No pallor.  Psychiatric: He has a normal mood and affect. His behavior is normal. Judgment and thought content normal.    Lab Results  Component Value Date   WBC 7.5 01/09/2011   HGB 15.1 01/09/2011   HCT 44.6 01/09/2011   PLT 218 01/09/2011   GLUCOSE 152* 02/21/2011   CHOL 119 02/01/2011   TRIG 101.0 02/01/2011   HDL 51.10 02/01/2011   LDLCALC 48 02/01/2011   ALT 20 01/09/2011   ALT 20 01/09/2011   ALT 20 01/09/2011   ALT 20 01/09/2011   AST 19 01/09/2011   AST 19 01/09/2011   AST 19 01/09/2011   AST 19 01/09/2011   NA 141 02/21/2011   K 4.4 02/21/2011   CL 107 02/21/2011   CREATININE 1.1 02/21/2011   BUN 20 02/21/2011   CO2 28 02/21/2011   TSH 0.53 12/14/2010   PSA 3.05 12/14/2010   INR 1.01 08/22/2010   HGBA1C 6.1 02/01/2011   Ct Abdomen Pelvis W Contrast  01/16/2011  *RADIOLOGY REPORT*  Clinical Data: Left upper quadrant discomfort for 6 months. Question splenic abnormality, kidney stones, or mass.  CT ABDOMEN AND PELVIS WITH CONTRAST  Technique:  Multidetector CT imaging of the abdomen and pelvis was performed following the standard protocol during bolus administration of intravenous contrast.  Contrast: OMNIPAQUE IOHEXOL 300 MG/ML IV SOLN  Comparison: Plain films of 12/17/2010.  Most recent CT of 04/07/2009.  Findings: Clear lung bases.  Advanced coronary artery atherosclerosis with  probable left apical ventricular remote infarct on image 1.  A tiny hiatal hernia. No pericardial or pleural effusion.  Normal liver, spleen.  Underdistended proximal stomach.  Fatty replaced pancreatic head and uncinate process. Normal gallbladder, biliary tract, adrenal glands.  Mild bilateral renal atrophy.  Punctate upper pole left renal calculus is not significantly changed.  Nonobstructive. No hydroureter or ureteric calculi.  Too small to characterize bilateral renal lesions.  Lower pole right renal cyst  No retroperitoneal or retrocrural adenopathy.  Extensive colonic diverticulosis.  Normal terminal ileum and appendix. Normal small bowel without abdominal ascites.    No pelvic adenopathy.    Normal urinary bladder and prostate.  No significant free fluid.  Subchondral cyst in the left acetabulum. Degenerative fusion of the left greater than right sacroiliac joints.  Degenerative disc disease at L2-L3 is advanced.  IMPRESSION: Similar appearance of punctate nonobstructive left renal calculus. No other explanation for left-sided abdominal pain.  Original Report Authenticated By: Consuello Bossier, M.D.     Assessment & Plan:

## 2011-04-12 ENCOUNTER — Encounter: Payer: Self-pay | Admitting: Internal Medicine

## 2011-04-12 ENCOUNTER — Ambulatory Visit: Payer: Medicare Other | Admitting: Internal Medicine

## 2011-04-12 NOTE — Assessment & Plan Note (Signed)
He has pre-diabetes 

## 2011-04-12 NOTE — Assessment & Plan Note (Addendum)
He has had chronic, recurrent episodes of LUQ abd pain and I believe that the pain is a remnant of PHN but I have not been able to convince him of that and he has not been willing to take lyrica as needed to see if that would help the pain so I will now ask him to see a urologist to see if there is a chance that the left renal stone is causing his pain, also today I will check his labs to see if there is any evidence of blood in his urine or other metabolic process that may be causing his pain

## 2011-04-12 NOTE — Assessment & Plan Note (Signed)
Urology referral

## 2011-04-23 ENCOUNTER — Encounter: Payer: Self-pay | Admitting: Neurosurgery

## 2011-04-24 ENCOUNTER — Ambulatory Visit (INDEPENDENT_AMBULATORY_CARE_PROVIDER_SITE_OTHER): Payer: Medicare Other | Admitting: Neurosurgery

## 2011-04-24 ENCOUNTER — Ambulatory Visit (INDEPENDENT_AMBULATORY_CARE_PROVIDER_SITE_OTHER): Payer: Medicare Other | Admitting: *Deleted

## 2011-04-24 ENCOUNTER — Ambulatory Visit: Payer: Self-pay | Admitting: Vascular Surgery

## 2011-04-24 ENCOUNTER — Encounter: Payer: Self-pay | Admitting: Neurosurgery

## 2011-04-24 VITALS — BP 147/86 | HR 56 | Resp 16 | Ht 72.0 in | Wt 227.9 lb

## 2011-04-24 DIAGNOSIS — I6529 Occlusion and stenosis of unspecified carotid artery: Secondary | ICD-10-CM

## 2011-04-24 NOTE — Progress Notes (Signed)
[facility] HISTORY AND PHYSICAL   CC: 78 year old male with known carotid stenosis for six-month duplex and followup Referring Physician: Dr. Edilia Bo  History of Present Illness: 78 year old male with known carotid stenosis and history of totally occluded left CCA and ECA. Patient reports no signs of amaurosis fugax signs or symptoms of CVA or TIA. His only complaints today are 2 small lipomas the posterior cervical spine which he has had his primary care, Dr. Yetta Barre checkout and he did not feel like they needed any emergent care.  Past Medical History  Diagnosis Date  . CAD (coronary artery disease)   . COPD (chronic obstructive pulmonary disease)   . Osteoarthrosis, unspecified whether generalized or localized, unspecified site   . History of nephrolithiasis   . GERD (gastroesophageal reflux disease)   . Tubulovillous adenoma polyp of colon     w/HGD 1999  . Hyperlipidemia   . Carotid stenosis     Followed by Dr Madilyn Fireman - Angiogram 5/10 showed left common carotid to be totally occluded and 50% RICA stenosis  . HTN (hypertension)     ACEI Cough  . CVA (cerebral infarction)   . Shingles     ROS: [x]  Positive   [ ]  Denies    General: [ ]  Weight loss, [ ]  Fever, [ ]  chills Neurologic: [ ]  Dizziness, [ ]  Blackouts, [ ]  Seizure [ ]  Stroke, [ ]  "Mini stroke", [ ]  Slurred speech, [ ]  Temporary blindness; [ ]  weakness in arms or legs, [ ]  Hoarseness Cardiac: [ ]  Chest pain/pressure, [ ]  Shortness of breath at rest [ ]  Shortness of breath with exertion, [ ]  Atrial fibrillation or irregular heartbeat Vascular: [ ]  Pain in legs with walking, [ ]  Pain in legs at rest, [ ]  Pain in legs at night,  [ ]  Non-healing ulcer, [ ]  Blood clot in vein/DVT,   Pulmonary: [ ]  Home oxygen, [ ]  Productive cough, [ ]  Coughing up blood, [ ]  Asthma,  [ ]  Wheezing Musculoskeletal:  [ ]  Arthritis, [ ]  Low back pain, [ ]  Joint pain Hematologic: [ ]  Easy Bruising, [ ]  Anemia; [ ]   Hepatitis Gastrointestinal: [ ]  Blood in stool, [ ]  Gastroesophageal Reflux/heartburn, [ ]  Trouble swallowing Urinary: [ ]  chronic Kidney disease, [ ]  on HD - [ ]  MWF or [ ]  TTHS, [ ]  Burning with urination, [ ]  Difficulty urinating Skin: [ ]  Rashes, [ ]  Wounds Psychological: [ ]  Anxiety, [ ]  Depression   Social History History  Substance Use Topics  . Smoking status: Former Smoker    Quit date: 01/28/1993  . Smokeless tobacco: Not on file  . Alcohol Use: No    Family History Family History  Problem Relation Age of Onset  . Alcohol abuse Mother   . Alcohol abuse Father   . Colon cancer Neg Hx     Allergies  Allergen Reactions  . Spiriva Handihaler     Dry mouth  . Daliresp (Roflumilast)     Bad thoughts/dreams    Current Outpatient Prescriptions  Medication Sig Dispense Refill  . ADVAIR DISKUS 250-50 MCG/DOSE AEPB INHALE ONE PUFF BY MOUTH TWICE DAILY  60 each  10  . Ascorbic Acid (VITAMIN C) 1000 MG tablet Take 1,000 mg by mouth daily.        Marland Kitchen aspirin 81 MG EC tablet Take 81 mg by mouth daily. 2 tabs daily      . Calcium Citrate-Vitamin D (CALCIUM CITRATE + PO) Take  2 tablets by mouth daily.        . Cholecalciferol 1000 UNITS tablet Take 1,000 Units by mouth daily.        . CRESTOR 20 MG tablet TAKE ONE TABLET BY MOUTH ONE TIME DAILY  30 each  0  . l-methylfolate-B6-B12 (METANX) 3-35-2 MG TABS Take 1 tablet by mouth 2 (two) times daily.  60 tablet  0  . losartan (COZAAR) 25 MG tablet Take 1 tablet (25 mg total) by mouth daily.  30 tablet  6  . mometasone (NASONEX) 50 MCG/ACT nasal spray 2 sprays by Nasal route daily.        . nebivolol (BYSTOLIC) 10 MG tablet Take 1 tablet (10 mg total) by mouth daily.  56 tablet  0  . traMADol-acetaminophen (ULTRACET) 37.5-325 MG per tablet TAKE ONE TABLET BY MOUTH FOUR TIMES DAILY AS NEEDED FOR PAIN  100 tablet  4  . zolpidem (AMBIEN) 10 MG tablet Take 1 tablet (10 mg total) by mouth at bedtime as needed.  30 tablet  5    Physical  Examination  Filed Vitals:   04/24/11 1408  BP: 147/86  Pulse: 56  Resp: 16    Body mass index is 30.91 kg/(m^2).  General:  WDWN in NAD Gait: Normal HEENT: WNL Eyes: Pupils equal Pulmonary: normal non-labored breathing , without Rales, rhonchi,  wheezing Cardiac: RRR, without  Murmurs, rubs or gallops; Abdomen: soft, NT, no masses Skin: no rashes, ulcers noted  Vascular Exam Pulses: Patient has 2+ radial pulses bilaterally Carotid bruits: A question of mild bruit on the right, no sounds auscultation of the left carotid. Extremities without ischemic changes, no Gangrene , no cellulitis; no open wounds;  Musculoskeletal: no muscle wasting or atrophy   Neurologic: A&O X 3; Appropriate Affect ; SENSATION: normal; MOTOR FUNCTION:  moving all extremities equally. Speech is fluent/normal  Non-Invasive Vascular Imaging CAROTID DUPLEX 04/24/2011  Right ICA 60 - 79 % stenosis Left ICA 0ccluded stenosis   ASSESSMENT/PLAN: There is little change in the carotid duplex from August 2012. Patient has no new complaints therefore we will follow him up in 6 months with a repeat carotid duplex and exam in my clinic. Regarding the lipomas on the posterior aspect of his neck I have referred him back to Dr. Yetta Barre for questionable referral to a general surgeon. His questions were encouraged and answered he is in agreement with this plan  Lauree Chandler ANP     Clinic MD Edilia Bo

## 2011-05-03 ENCOUNTER — Telehealth: Payer: Self-pay

## 2011-05-03 NOTE — Telephone Encounter (Signed)
Patient called requesting lab results from 04/10/11. Please advise thanks

## 2011-05-03 NOTE — Telephone Encounter (Signed)
Labs looked really good

## 2011-05-06 NOTE — Telephone Encounter (Signed)
Patient notified

## 2011-05-07 ENCOUNTER — Encounter: Payer: Self-pay | Admitting: Cardiology

## 2011-05-07 ENCOUNTER — Ambulatory Visit (INDEPENDENT_AMBULATORY_CARE_PROVIDER_SITE_OTHER): Payer: Medicare Other | Admitting: Cardiology

## 2011-05-07 VITALS — BP 132/82 | HR 76 | Ht 72.0 in | Wt 226.0 lb

## 2011-05-07 DIAGNOSIS — I6529 Occlusion and stenosis of unspecified carotid artery: Secondary | ICD-10-CM

## 2011-05-07 DIAGNOSIS — I2510 Atherosclerotic heart disease of native coronary artery without angina pectoris: Secondary | ICD-10-CM

## 2011-05-07 DIAGNOSIS — I25810 Atherosclerosis of coronary artery bypass graft(s) without angina pectoris: Secondary | ICD-10-CM

## 2011-05-07 DIAGNOSIS — E785 Hyperlipidemia, unspecified: Secondary | ICD-10-CM

## 2011-05-07 MED ORDER — LOSARTAN POTASSIUM 25 MG PO TABS: ORAL_TABLET | ORAL | Status: DC

## 2011-05-07 NOTE — Patient Instructions (Signed)
Take losartan 12.5mg  daily. This will be one-half 25mg  tablet daily.  Your physician wants you to follow-up in: 6 months with Dr Shirlee Latch.(October 2013). You will receive a reminder letter in the mail two months in advance. If you don't receive a letter, please call our office to schedule the follow-up appointment.   Your physician recommends that you return for a FASTING lipid profile /liver profile in 6 months when you see Dr Shirlee Latch.

## 2011-05-08 NOTE — Procedures (Unsigned)
CAROTID DUPLEX EXAM  INDICATION:  Follow up carotid artery stenosis.  HISTORY: Diabetes:  No. Cardiac:  CABG, coronary artery disease. Hypertension:  Yes. Smoking:  Previously. Previous Surgery: CV History:  CVA in 1990. Amaurosis Fugax No, Paresthesias No, Hemiparesis No                                      RIGHT             LEFT Brachial systolic pressure:         132               142 Brachial Doppler waveforms:         Triphasic         Triphasic Vertebral direction of flow:        Antegrade         Antegrade DUPLEX VELOCITIES (cm/sec) CCA peak systolic                   107               Occluded ECA peak systolic                   385               Occluded ICA peak systolic                   293               96 (abnormal) ICA end diastolic                   66                24 PLAQUE MORPHOLOGY:                  Mixed, irregular  Occlusive PLAQUE AMOUNT:                      Moderate to severe                  Occlusive PLAQUE LOCATION:                    CCA, ICA, and ECA CCA, ICA, and ECA  IMPRESSION:  60% to 70% right ICA stenosis.  Severe right ECA stenosis.  Known occlusion of the left common carotid artery and ECA.  Left ICA appears to be fed by collateral.  No significant change since prior study of 09/14/2010.     ___________________________________________ Di Kindle. Edilia Bo, M.D.  SS/MEDQ  D:  04/24/2011  T:  04/24/2011  Job:  [id number]

## 2011-05-08 NOTE — Assessment & Plan Note (Signed)
Has regular followup at [facility] for significant carotid disease.

## 2011-05-08 NOTE — Assessment & Plan Note (Signed)
LDL < 70 (goal) in 1/13.  Repeat lipids/LFTs in 6 months at followup appointment.

## 2011-05-08 NOTE — Assessment & Plan Note (Signed)
S/p CABG.  Last cath showed 2/3 patent grafts.  I do not think that the LUQ and epigastric abdominal pain he has is related cardiac-related.  He has no significant exertional symptoms and works out frequently.  Continue ASA 81, Crestor, and nebivolol.  Rather than taking losartan 25 mg every other day, I would like him to take 12.5 mg daily.

## 2011-05-08 NOTE — Progress Notes (Signed)
PCP: Dr. Yetta Barre  78 yo with history of CAD s/p CABG, carotid stenosis, and COPD presents for cardiology followup.  He had a myoview suggesting ischemia in 5/10 followed by catheterization showing patent LIMA and SVG-PLV. There was a chronic occlusion of the free radial to diagonal graft. There was no evidence by catheterization for lesion causing significant ischemia. Patient additionally had an angiogram for evaluation of carotid disease showing occluded left common carotid and 50% stenosis of RICA. He sees pulmonology for COPD. Echo in 7/11 showed preserved LV systolic function with basal inferoposterior akinesis.   Patient has been doing reasonably well since I last saw him.  He has been diagnosed with MGUS and is followed by Dr. Gaylyn Rong.  He works out on a treadmill or elliptical for up to an hour three times a week at a gym.  No exertional chest pain.  He continues to note a mild pressure-like sensation under his rib cage in the left upper quadrant and epigastric areas of his abdomen.  This is unrelated to exertion.  He is not sure if food makes it worse.  It comes and goes.  He has no dyspnea walking on flat ground, mild dyspnea walking up hills. He is taking losartan 25 mg every other day rather than daily.  Weight is down 3 lbs since last appointment.   Labs (1/11): BNP 57, TSH normal, LDL 98, HDL 49  Labs (12/12): K 4.4, creatinine 1.05 Labs (1/13): LDL 48, HDL 51 Labs (3/13): K 4.5, creatinine 1.1  Allergies (verified):  No Known Drug Allergies   Past Medical History:  1. CAD, ARTERY BYPASS GRAFT: s/p CABG in [place] in 1989. Had myoview in 5/10 showing EF 50% and apical ischemia. LHC was done in 5/10 showing 90% pLAD, 90% mLAD, 60% mCFX, 70% pRCA, 70% mRCA, 95% dRCA, SVG-PLV patent, LIMA-LAD patent, no other grafts found by aortic root shot. Free radial-D presumed occluded.  Echo (5/11): EF 55%, mild LVH, basal inferoposterior akinesis, normal RV.  2. COPD (ICD-496): PFTs 9/10 with FVC  65%, FEV1 57%. 3. OSTEOARTHRITIS (ICD-715.90)  4. NEPHROLITHIASIS, HX OF (ICD-V13.01)  5. GERD (ICD-530.81)  6. Tubulovillous adenomatous colon polyps with HGD 1999  7. Hyperlipidemia  8. Carotid stenosis: Followed by Dr. Edilia Bo. Angiogram 5/10 showed left common carotid to be totally occluded and 50% RICA stenosis.  9. HTN: ACEI cough  10. CVA  11. Shingles  12. MGUS: Followed by Dr. Gaylyn Rong.   Family History:  father died of a self-inflicted wound age 49, history of EtOH  mother died age 108 history of EtOH  One sister is well   Social History:   Retired  Single  former Personnel officer company in [employer]  Regular exercise-yes  Alcohol use-no  Quit smoking around 20 years ago.  Alcohol Use - no  Daily Caffeine Use: 2-3 cups weekly    Current Outpatient Prescriptions  Medication Sig Dispense Refill  . ADVAIR DISKUS 250-50 MCG/DOSE AEPB INHALE ONE PUFF BY MOUTH TWICE DAILY  60 each  10  . Ascorbic Acid (VITAMIN C) 1000 MG tablet Take 1,000 mg by mouth daily.        Marland Kitchen aspirin 81 MG EC tablet Take 81 mg by mouth daily. 2 tabs daily      . Calcium Citrate-Vitamin D (CALCIUM CITRATE + PO) Take 2 tablets by mouth daily.        . Cholecalciferol 1000 UNITS tablet Take 1,000 Units by mouth daily.        Marland Kitchen  CRESTOR 20 MG tablet TAKE ONE TABLET BY MOUTH ONE TIME DAILY  30 each  0  . l-methylfolate-B6-B12 (METANX) 3-35-2 MG TABS Take 1 tablet by mouth 2 (two) times daily.  60 tablet  0  . mometasone (NASONEX) 50 MCG/ACT nasal spray 2 sprays by Nasal route daily.        . nebivolol (BYSTOLIC) 10 MG tablet Take 1 tablet (10 mg total) by mouth daily.  56 tablet  0  . traMADol-acetaminophen (ULTRACET) 37.5-325 MG per tablet TAKE ONE TABLET BY MOUTH FOUR TIMES DAILY AS NEEDED FOR PAIN  100 tablet  4  . zolpidem (AMBIEN) 10 MG tablet Take 1 tablet (10 mg total) by mouth at bedtime as needed.  30 tablet  5  . losartan (COZAAR) 25 MG tablet 1/2 tablet daily  30 tablet  6  . DISCONTD:  Hypertonic Nasal Wash (SINUS RINSE BOTTLE KIT NA) by Nasal route as directed.         BP 132/82  Pulse 76  Ht 6' (1.829 m)  Wt 226 lb (102.513 kg)  BMI 30.65 kg/m2 General: NAD Neck: No JVD, no thyromegaly or thyroid nodule.  Lungs: Clear to auscultation bilaterally with normal respiratory effort. CV: Nondisplaced PMI.  Heart regular S1/S2, no S3/S4, no murmur.  1+ left ankle edema (chronic since CABG).  Bilateral carotid bruits.  Normal pedal pulses.  Abdomen: Soft, nontender, no hepatosplenomegaly, no distention.  Neurologic: Alert and oriented x 3.  Psych: Normal affect. Extremities: No clubbing or cyanosis.

## 2011-05-09 ENCOUNTER — Ambulatory Visit (INDEPENDENT_AMBULATORY_CARE_PROVIDER_SITE_OTHER): Payer: Medicare Other | Admitting: Internal Medicine

## 2011-05-09 ENCOUNTER — Encounter: Payer: Self-pay | Admitting: Internal Medicine

## 2011-05-09 VITALS — BP 118/80 | HR 68 | Temp 98.1°F | Resp 16 | Wt 227.0 lb

## 2011-05-09 DIAGNOSIS — I10 Essential (primary) hypertension: Secondary | ICD-10-CM

## 2011-05-09 DIAGNOSIS — E785 Hyperlipidemia, unspecified: Secondary | ICD-10-CM

## 2011-05-09 DIAGNOSIS — L98499 Non-pressure chronic ulcer of skin of other sites with unspecified severity: Secondary | ICD-10-CM

## 2011-05-09 DIAGNOSIS — B0229 Other postherpetic nervous system involvement: Secondary | ICD-10-CM | POA: Insufficient documentation

## 2011-05-09 DIAGNOSIS — R7309 Other abnormal glucose: Secondary | ICD-10-CM

## 2011-05-09 DIAGNOSIS — R739 Hyperglycemia, unspecified: Secondary | ICD-10-CM

## 2011-05-09 MED ORDER — PREGABALIN 50 MG PO CAPS: 50.0000 mg | ORAL_CAPSULE | Freq: Three times a day (TID) | ORAL | Status: DC

## 2011-05-09 NOTE — Patient Instructions (Signed)
Postherpetic Neuralgia  Shingles is a painful disease. It is caused by the herpes zoster virus. This is the same virus which also causes chickenpox. It can affect the torso, limbs, or the face. For most people, shingles is a condition of rather sudden onset. Pain usually lasts about 1 month.  In older patients, or patients with poor immune systems, a painful, long-standing (chronic) condition called postherpetic neuralgia can develop. This condition rarely happens before age 50. But at least 50% of people over 50 become affected following an attack of shingles. There is a natural tendency for this condition to improve over time with no treatment. Less than 5% of patients have pain that lasts for more than 1 year.  DIAGNOSIS   Herpes is usually easily diagnosed on physical exam. Pain sometimes follows when the skin sores (lesions) have disappeared. It is called postherpetic neuralgia. That name simply means the pain that follows herpes.  TREATMENT    Treating this condition may be difficult. Usually one of the tricyclic antidepressants, often amitriptyline, is the first line of treatment. There is evidence that the sooner these medications are given, the more likely they are to reduce pain.   Conventional analgesics, regional nerve blocks, and anticonvulsants have little benefit in most cases when used alone. Other tricyclic anti-depressants are used as a second option if the first antidepressant is unsuccessful.   Anticonvulsants, including carbamazepine, have been found to provide some added benefit when used with a tricyclic anti-depressant. This is especially for the stabbing type of pain similar to that of trigeminal neuralgia.   Chronic opioid therapy. This is a strong narcotic pain medication. It is used to treat pain that is resistant to other measures. The issues of dependency and tolerance can be reduced with closely managed care.   Some cream treatments are applied locally to the affected area. They  can help when used with other treatments. Their use may be difficult in the case of postherpetic trigeminal neuralgia. This is involved with the face. So the substances can irritate the eye and the skin around the eye. Examples of creams used include Capsaicin and lidocaine creams.   For shingles, antiviral therapies along with analgesics are recommended. Studies of the effect of anti-viral agents such as acyclovir on shingles have been done. They show improved rates of healing and decreased severity of sudden (acute) pain. Some observations suggest that nerve blocks during shingles infection will:   Reduce pain.   Shorten the acute episode.   Prevent the emergence of postherpetic neuralgia.  Viral medications used include Acyclovir (Zovirax), Valacyclovir, Famciclovir and a lysine diet.  Document Released: 04/06/2002 Document Revised: 01/03/2011 Document Reviewed: 01/14/2005  [employer] Patient Information 2012 [employer].

## 2011-05-10 DIAGNOSIS — L98499 Non-pressure chronic ulcer of skin of other sites with unspecified severity: Secondary | ICD-10-CM | POA: Insufficient documentation

## 2011-05-11 ENCOUNTER — Encounter: Payer: Self-pay | Admitting: Internal Medicine

## 2011-05-11 NOTE — Assessment & Plan Note (Signed)
His BP is well controlled 

## 2011-05-11 NOTE — Progress Notes (Signed)
Subjective:    Patient ID: Alfred Armstrong, male    DOB: 06-06-1931, 78 y.o.   MRN: [id number]  HPI He returns for f/up and he complains of a persistent, vague pain in his LUQ and left flank pain, the only abn finding on his CT was a left renal stone but he saw urology and was told that the stone is not causing his pain. Also, he saw cardiology and was told that the pain is not cardiac in nature. He has had this pain intermittently for more than one year and the location is consistent with the sight of a shingles outbreak that I treated 2 years ago. Over the last 2 years this pain has been treated with lyrica but he had forgotten that and had not been trying lyrica recently for the pain.   Review of Systems  Constitutional: Negative.   HENT: Negative.   Eyes: Negative.   Respiratory: Negative for cough, chest tightness, shortness of breath, wheezing and stridor.   Cardiovascular: Negative for chest pain, palpitations and leg swelling.  Gastrointestinal: Positive for abdominal pain. Negative for nausea, vomiting, diarrhea, constipation, blood in stool, abdominal distention, anal bleeding and rectal pain.  Genitourinary: Negative for dysuria, urgency, frequency, hematuria, flank pain, decreased urine volume, discharge, penile swelling, scrotal swelling, enuresis, difficulty urinating, genital sores, penile pain and testicular pain.  Musculoskeletal: Negative.   Skin: Negative for color change, pallor, rash and wound.  Neurological: Negative.   Hematological: Negative for adenopathy. Does not bruise/bleed easily.  Psychiatric/Behavioral: Negative for suicidal ideas, hallucinations, behavioral problems, confusion, sleep disturbance, self-injury, dysphoric mood, decreased concentration and agitation. The patient is nervous/anxious. The patient is not hyperactive.        Objective:   Physical Exam  Vitals reviewed. Constitutional: He is oriented to person, place, and time. He appears  well-developed and well-nourished. No distress.  HENT:  Head: Normocephalic and atraumatic.  Mouth/Throat: Oropharynx is clear and moist. No oropharyngeal exudate.  Eyes: Conjunctivae are normal. Right eye exhibits no discharge. Left eye exhibits no discharge. No scleral icterus.  Neck: Normal range of motion. Neck supple. No JVD present. No tracheal deviation present. No thyromegaly present.  Cardiovascular: Normal rate, regular rhythm, normal heart sounds and intact distal pulses.  Exam reveals no gallop and no friction rub.   No murmur heard. Pulmonary/Chest: Effort normal and breath sounds normal. No stridor. No respiratory distress. He has no wheezes. He has no rales. He exhibits no tenderness.  Abdominal: Soft. Bowel sounds are normal. He exhibits no distension and no mass. There is no tenderness. There is no rebound and no guarding.  Musculoskeletal: Normal range of motion. He exhibits no edema and no tenderness.  Lymphadenopathy:    He has no cervical adenopathy.  Neurological: He is oriented to person, place, and time.  Skin: Skin is warm and dry. No rash noted. He is not diaphoretic. No erythema. No pallor.  Psychiatric: He has a normal mood and affect. His behavior is normal. Judgment and thought content normal.      Lab Results  Component Value Date   WBC 8.4 04/10/2011   HGB 14.6 04/10/2011   HCT 44.5 04/10/2011   PLT 218.0 04/10/2011   GLUCOSE 124* 04/10/2011   CHOL 119 02/01/2011   TRIG 101.0 02/01/2011   HDL 51.10 02/01/2011   LDLCALC 48 02/01/2011   ALT 21 04/10/2011   AST 22 04/10/2011   NA 138 04/10/2011   K 4.5 04/10/2011   CL 104 04/10/2011   CREATININE  1.1 04/10/2011   BUN 20 04/10/2011   CO2 25 04/10/2011   TSH 0.53 12/14/2010   PSA 3.05 12/14/2010   INR 1.01 08/22/2010   HGBA1C 6.3 04/10/2011      Assessment & Plan:

## 2011-05-11 NOTE — Assessment & Plan Note (Signed)
Podiatry referral

## 2011-05-11 NOTE — Assessment & Plan Note (Signed)
Restart lyrica

## 2011-05-11 NOTE — Assessment & Plan Note (Signed)
This has been well controlled, he is doing well on crestor

## 2011-05-11 NOTE — Assessment & Plan Note (Signed)
He has pre-diabetes 

## 2011-06-05 ENCOUNTER — Ambulatory Visit: Payer: Medicare Other | Admitting: Internal Medicine

## 2011-06-13 ENCOUNTER — Other Ambulatory Visit: Payer: Self-pay | Admitting: Oncology

## 2011-06-13 ENCOUNTER — Other Ambulatory Visit (HOSPITAL_BASED_OUTPATIENT_CLINIC_OR_DEPARTMENT_OTHER): Payer: Medicare Other | Admitting: Lab

## 2011-06-13 DIAGNOSIS — D472 Monoclonal gammopathy: Secondary | ICD-10-CM

## 2011-06-13 LAB — CBC WITH DIFFERENTIAL/PLATELET
BASO%: 0.7 % (ref 0.0–2.0)
EOS%: 4.2 % (ref 0.0–7.0)
LYMPH%: 28.3 % (ref 14.0–49.0)
MCH: 31.2 pg (ref 27.2–33.4)
MCHC: 34.4 g/dL (ref 32.0–36.0)
MONO#: 0.7 10*3/uL (ref 0.1–0.9)
Platelets: 247 10*3/uL (ref 140–400)
RBC: 4.57 10*6/uL (ref 4.20–5.82)
WBC: 8.6 10*3/uL (ref 4.0–10.3)
nRBC: 0 % (ref 0–0)

## 2011-06-17 ENCOUNTER — Encounter: Payer: Self-pay | Admitting: Oncology

## 2011-06-17 ENCOUNTER — Telehealth: Payer: Self-pay | Admitting: *Deleted

## 2011-06-17 ENCOUNTER — Ambulatory Visit (HOSPITAL_BASED_OUTPATIENT_CLINIC_OR_DEPARTMENT_OTHER): Payer: Medicare Other | Admitting: Oncology

## 2011-06-17 VITALS — BP 126/66 | HR 63 | Temp 98.8°F | Ht 72.0 in | Wt 227.8 lb

## 2011-06-17 DIAGNOSIS — D472 Monoclonal gammopathy: Secondary | ICD-10-CM

## 2011-06-17 DIAGNOSIS — D1779 Benign lipomatous neoplasm of other sites: Secondary | ICD-10-CM

## 2011-06-17 DIAGNOSIS — E119 Type 2 diabetes mellitus without complications: Secondary | ICD-10-CM

## 2011-06-17 HISTORY — DX: Monoclonal gammopathy: D47.2

## 2011-06-17 LAB — PROTEIN ELECTROPHORESIS, SERUM
Alpha-1-Globulin: 4 % (ref 2.9–4.9)
Alpha-2-Globulin: 10.8 % (ref 7.1–11.8)
Beta 2: 5.1 % (ref 3.2–6.5)
Beta Globulin: 5.9 % (ref 4.7–7.2)
Gamma Globulin: 18.2 % (ref 11.1–18.8)
M-Spike, %: 0.78 g/dL

## 2011-06-17 LAB — COMPREHENSIVE METABOLIC PANEL
ALT: 17 U/L (ref 0–53)
AST: 18 U/L (ref 0–37)
Alkaline Phosphatase: 50 U/L (ref 39–117)
BUN: 14 mg/dL (ref 6–23)
Chloride: 104 mEq/L (ref 96–112)
Creatinine, Ser: 1.04 mg/dL (ref 0.50–1.35)
Total Bilirubin: 0.5 mg/dL (ref 0.3–1.2)

## 2011-06-17 NOTE — Patient Instructions (Addendum)
Your labs remain stable. There is no anemia, renal insufficiency, or elevated calcium. Your M-spike and light chains remains stable.  Recommend follow-up with Dr Gaylyn Rong in about 5-6 months. Please have labs drawn about 1 week before your next visit so we may review your labs with you during the visit.  Results for Alfred Armstrong, Alfred Armstrong (MRN [id number]) as of 06/17/2011 13:32  Ref. Range 06/13/2011 13:30  WBC Latest Range: 4.5-10.5 K/uL 8.6  RBC Latest Range: 4.22-5.81 Mil/uL 4.57  Hemoglobin Latest Range: 13.0-17.0 g/dL 11.9  HCT Latest Range: 39.0-52.0 % 41.5  MCV Latest Range: 78.0-100.0 fl 90.7  MCH Latest Range: 26.0-34.0 pg 31.2  MCHC Latest Range: 30.0-36.0 g/dL 14.7  RDW Latest Range: 11.5-14.6 % 13.0  Platelets Latest Range: 150.0-400.0 K/uL 247  NEUT% Latest Range: 39.0-75.0 % 59.1  LYMPH% Latest Range: 14.0-49.0 % 28.3  MONO% Latest Range: 0.0-14.0 % 7.7  EOS% Latest Range: 0.0-7.0 % 4.2  BASO% Latest Range: 0.0-2.0 % 0.7  NEUT# Latest Range: 1.4-7.7 K/uL 5.1  MONO# Latest Range: 0.1-0.9 10e3/uL 0.7  Eosinophils Absolute Latest Range: 0.0-0.5 10e3/uL 0.4  Basophils Absolute Latest Range: 0.0-0.1 K/uL 0.1  lymph# Latest Range: 0.9-3.3 10e3/uL 2.4  nRBC Latest Range: 0-0 % 0

## 2011-06-17 NOTE — Telephone Encounter (Signed)
gave patient appointment for 10-2011 with the lab one week before the md aqpointment

## 2011-06-17 NOTE — Progress Notes (Signed)
[facility]  [phone] [phone]   OFFICE PROGRESS NOTE   Cc:  Sanda Linger, MD, MD  DIAGNOSIS: Monoclonal gammopathy of undermined significance (MGUS) vs. smoldering (nonactive) myeloma.  CURRENT THERAPY: Watchful observation.  INTERVAL HISTORY: Alfred Armstrong 78 y.o. male returns for regular follow up by himself. Left flank pain has improved. Working with PCP for management of this; using Lyrica as needed as pain is thought to be due to post-herpetic neuralgia. He is still able to do exercise about 3 times a week with elliptical about 45 minutes each time. He denies any anorexia, weight loss, hematuria, flank mass. He notes 2 lipomas to his posterior neck. Areas have been present about 1 year and are slightly smaller. Areas are non-tender.  Patient denies fatigue, headache, visual changes, confusion, drenching night sweats, palpable lymph node swelling, mucositis, odynophagia, dysphagia, nausea vomiting, jaundice, chest pain, palpitation, shortness of breath, dyspnea on exertion, productive cough, gum bleeding, epistaxis, hematemesis, hemoptysis, abdominal pain, abdominal swelling, early satiety, melena, hematochezia, hematuria, skin rash, spontaneous bleeding, joint swelling, joint pain, heat or cold intolerance, bowel bladder incontinence, back pain, focal motor weakness, paresthesia, depression, suicidal or homocidal ideation, feeling hopelessness.   Past Medical History  Diagnosis Date  . CAD (coronary artery disease)   . COPD (chronic obstructive pulmonary disease)   . Osteoarthrosis, unspecified whether generalized or localized, unspecified site   . History of nephrolithiasis   . GERD (gastroesophageal reflux disease)   . Tubulovillous adenoma polyp of colon     w/HGD 1999  . Hyperlipidemia   . Carotid stenosis     Followed by Dr Madilyn Fireman - Angiogram 5/10 showed left common carotid to be totally occluded and 50% RICA stenosis  . HTN  (hypertension)     ACEI Cough  . CVA (cerebral infarction)   . Shingles   . MGUS (monoclonal gammopathy of unknown significance) 06/17/2011    Past Surgical History  Procedure Date  . Coronary artery bypass graft 1989  . Tonsillectomy 1941  . Coronary angioplasty 04/2008  . Carotid artery angiogram 04/2008    Current Outpatient Prescriptions  Medication Sig Dispense Refill  . ADVAIR DISKUS 250-50 MCG/DOSE AEPB INHALE ONE PUFF BY MOUTH TWICE DAILY  60 each  10  . Ascorbic Acid (VITAMIN C) 1000 MG tablet Take 1,000 mg by mouth daily.        Marland Kitchen aspirin 81 MG EC tablet Take 81 mg by mouth daily. 2 tabs daily      . Calcium Citrate-Vitamin D (CALCIUM CITRATE + PO) Take 2 tablets by mouth daily.        . Cholecalciferol 1000 UNITS tablet Take 1,000 Units by mouth daily.        . CRESTOR 20 MG tablet TAKE ONE TABLET BY MOUTH ONE TIME DAILY  30 each  0  . l-methylfolate-B6-B12 (METANX) 3-35-2 MG TABS Take 1 tablet by mouth 2 (two) times daily.  60 tablet  0  . losartan (COZAAR) 25 MG tablet 1/2 tablet daily  30 tablet  6  . mometasone (NASONEX) 50 MCG/ACT nasal spray 2 sprays by Nasal route daily.        . nebivolol (BYSTOLIC) 10 MG tablet Take 1 tablet (10 mg total) by mouth daily.  56 tablet  0  . pregabalin (LYRICA) 50 MG capsule Take 1 capsule (50 mg total) by mouth 3 (three) times daily.  63 capsule  0  . traMADol-acetaminophen (ULTRACET) 37.5-325 MG per tablet TAKE ONE TABLET BY  MOUTH FOUR TIMES DAILY AS NEEDED FOR PAIN  100 tablet  4  . zolpidem (AMBIEN) 10 MG tablet Take 1 tablet (10 mg total) by mouth at bedtime as needed.  30 tablet  5  . DISCONTD: Hypertonic Nasal Wash (SINUS RINSE BOTTLE KIT NA) by Nasal route as directed.          ALLERGIES:  is allergic to tiotropium bromide monohydrate and daliresp.  REVIEW OF SYSTEMS:  The rest of the 14-point review of system was negative.   Filed Vitals:   06/17/11 1354  BP: 126/66  Pulse: 63  Temp: 98.8 F (37.1 C)   Wt Readings  from Last 3 Encounters:  06/17/11 227 lb 12.8 oz (103.329 kg)  05/09/11 227 lb (102.967 kg)  05/07/11 226 lb (102.513 kg)   ECOG Performance status: 0  PHYSICAL EXAMINATION:  General:  well-nourished in no acute distress.  Eyes:  no scleral icterus.  ENT:  There were no oropharyngeal lesions.  Neck was without thyromegaly.  Lymphatics:  Negative cervical, supraclavicular or axillary adenopathy.  Respiratory: lungs were clear bilaterally without wheezing or crackles.  Cardiovascular:  Regular rate and rhythm, S1/S2, without murmur, rub or gallop.  There was no pedal edema.  GI:  abdomen was soft, flat, nontender, nondistended, without organomegaly.  Muscoloskeletal:  no spinal tenderness of palpation of vertebral spine.  Skin exam was without echymosis, petichae.  Neuro exam was nonfocal.  Patient was able to get on and off exam table without assistance.  Gait was normal.  Patient was alerted and oriented.  Attention was good.   Language was appropriate.  Mood was normal without depression.  Speech was not pressured.  Thought content was not tangential.    LABORATORY/RADIOLOGY DATA:  Lab Results  Component Value Date   WBC 8.6 06/13/2011   HGB 14.3 06/13/2011   HCT 41.5 06/13/2011   PLT 247 06/13/2011   GLUCOSE 138* 06/13/2011   CHOL 119 02/01/2011   TRIG 101.0 02/01/2011   HDL 51.10 02/01/2011   LDLCALC 48 02/01/2011   ALKPHOS 50 06/13/2011   ALT 17 06/13/2011   AST 18 06/13/2011   NA 136 06/13/2011   K 3.8 06/13/2011   CL 104 06/13/2011   CREATININE 1.04 06/13/2011   BUN 14 06/13/2011   CO2 25 06/13/2011   PSA 3.05 12/14/2010   INR 1.01 08/22/2010   HGBA1C 6.3 04/10/2011   Results for Alfred, Armstrong (MRN [id number]) as of 06/17/2011 13:32  Ref. Range 07/18/2010 13:49 01/09/2011 13:35 01/09/2011 13:35 06/13/2011 13:30  M-SPIKE, % No range found 1.02 0.85 0.85 0.78   Results for Alfred, Armstrong (MRN [id number]) as of 06/17/2011 13:32  Ref. Range 06/13/2011 13:30  Kappa free light chain Latest Range:  0.33-1.94 mg/dL 4.01  Kappa:Lambda Ratio Latest Range: 0.26-1.65  0.97  Lambda Free Lght Chn Latest Range: 0.57-2.63 mg/dL 0.27   ASSESSMENT AND PLAN:  1. MGUS: No evidence of disease progression to active myeloma. He has no end-organ damage. He has no anemia, renal insufficiency, hypercalcemia or lytic bone lesion. I recommended him to continue observation.  2. Borderline diabetes mellitus. He is on diet control only.  3. Hyperlipidemia. He is on Rosuvastatin and omega fatty acid per PCP,  4. History of coronary artery disease, status post coronary artery bypass grafting in distant past.  5. History of smoking. He quit 20 years ago and he has mild COPD, and is on inhalers.  6. Follow up: With me in a bout 5 months; sooner  if problems.   The length of time of the face-to-face encounter was 15 minutes. More than 50% of time was spent counseling and coordination of care.

## 2011-07-10 ENCOUNTER — Ambulatory Visit (INDEPENDENT_AMBULATORY_CARE_PROVIDER_SITE_OTHER): Payer: Medicare Other | Admitting: Internal Medicine

## 2011-07-10 ENCOUNTER — Encounter: Payer: Self-pay | Admitting: Internal Medicine

## 2011-07-10 VITALS — BP 114/68 | HR 57 | Temp 98.7°F | Resp 16 | Wt 226.0 lb

## 2011-07-10 DIAGNOSIS — I10 Essential (primary) hypertension: Secondary | ICD-10-CM

## 2011-07-10 DIAGNOSIS — E785 Hyperlipidemia, unspecified: Secondary | ICD-10-CM

## 2011-07-10 NOTE — Assessment & Plan Note (Signed)
He is doing well on crestor 

## 2011-07-10 NOTE — Assessment & Plan Note (Signed)
His BP is well controlled 

## 2011-07-10 NOTE — Progress Notes (Signed)
  Subjective:    Patient ID: Alfred Armstrong, male    DOB: April 17, 1931, 78 y.o.   MRN: [id number]  Hypertension This is a chronic problem. The current episode started more than 1 year ago. The problem is unchanged. The problem is controlled. Pertinent negatives include no anxiety, blurred vision, chest pain, headaches, malaise/fatigue, neck pain, orthopnea, palpitations, peripheral edema, PND, shortness of breath or sweats. Past treatments include angiotensin blockers and beta blockers. The current treatment provides significant improvement. Compliance problems include exercise and diet.  Hypertensive end-organ damage includes CAD/MI.      Review of Systems  Constitutional: Negative for fever, chills, malaise/fatigue, diaphoresis, activity change, appetite change, fatigue and unexpected weight change.  HENT: Negative.  Negative for neck pain.   Eyes: Negative.  Negative for blurred vision.  Respiratory: Negative for apnea, cough, choking, chest tightness, shortness of breath, wheezing and stridor.   Cardiovascular: Negative for chest pain, palpitations, orthopnea, leg swelling and PND.  Gastrointestinal: Negative for nausea, vomiting, abdominal pain, diarrhea, constipation, blood in stool and anal bleeding.  Genitourinary: Negative.   Musculoskeletal: Negative for myalgias, back pain, joint swelling, arthralgias and gait problem.  Skin: Negative.  Negative for color change, pallor, rash and wound.  Neurological: Negative.  Negative for headaches.  Hematological: Negative for adenopathy. Does not bruise/bleed easily.  Psychiatric/Behavioral: Negative.        Objective:   Physical Exam  Vitals reviewed. Constitutional: He is oriented to person, place, and time. He appears well-developed and well-nourished. No distress.  HENT:  Head: Normocephalic and atraumatic.  Mouth/Throat: Oropharynx is clear and moist. No oropharyngeal exudate.  Eyes: Conjunctivae are normal. Right eye exhibits no  discharge. Left eye exhibits no discharge. No scleral icterus.  Neck: Normal range of motion. Neck supple. No JVD present. No tracheal deviation present. No thyromegaly present.  Cardiovascular: Normal rate, regular rhythm, normal heart sounds and intact distal pulses.  Exam reveals no gallop and no friction rub.   No murmur heard. Pulmonary/Chest: Effort normal and breath sounds normal. No stridor. No respiratory distress. He has no wheezes. He has no rales. He exhibits no tenderness.  Abdominal: Soft. Bowel sounds are normal. He exhibits no distension and no mass. There is no tenderness. There is no rebound and no guarding.  Musculoskeletal: Normal range of motion. He exhibits no edema and no tenderness.  Lymphadenopathy:    He has no cervical adenopathy.  Neurological: He is oriented to person, place, and time.  Skin: Skin is warm and dry. No rash noted. He is not diaphoretic. No erythema. No pallor.  Psychiatric: He has a normal mood and affect. His behavior is normal. Judgment and thought content normal.      Lab Results  Component Value Date   WBC 8.6 06/13/2011   HGB 14.3 06/13/2011   HCT 41.5 06/13/2011   PLT 247 06/13/2011   GLUCOSE 138* 06/13/2011   CHOL 119 02/01/2011   TRIG 101.0 02/01/2011   HDL 51.10 02/01/2011   LDLCALC 48 02/01/2011   ALT 17 06/13/2011   AST 18 06/13/2011   NA 136 06/13/2011   K 3.8 06/13/2011   CL 104 06/13/2011   CREATININE 1.04 06/13/2011   BUN 14 06/13/2011   CO2 25 06/13/2011   TSH 0.53 12/14/2010   PSA 3.05 12/14/2010   INR 1.01 08/22/2010   HGBA1C 6.3 04/10/2011      Assessment & Plan:

## 2011-07-11 ENCOUNTER — Telehealth: Payer: Self-pay

## 2011-07-11 NOTE — Telephone Encounter (Signed)
Returned callback to pt/LMOVM advising per MD. Also advised pt to call back if interested in receiving metanx through the [facility] for 48$ monthly. Per [employer]  rx can be sent to [facility] for get for 48$ month.

## 2011-07-11 NOTE — Telephone Encounter (Signed)
Patient called stating that insurance will Metanx nor generic soltanx. He would like to know what else he may take (OTC vitamin)  that equal. Thanks

## 2011-07-11 NOTE — Telephone Encounter (Signed)
He can try high dose otc b vitamin complex

## 2011-07-12 NOTE — Telephone Encounter (Signed)
No call back from patient, closing phone note

## 2011-08-21 ENCOUNTER — Telehealth: Payer: Self-pay

## 2011-08-21 MED ORDER — ZOLPIDEM TARTRATE 10 MG PO TABS: 10.0000 mg | ORAL_TABLET | Freq: Every evening | ORAL | Status: DC | PRN

## 2011-08-21 NOTE — Telephone Encounter (Signed)
Please advise if ok to refill zolpidem 10 mg, last filled 01/2011 #30/5rf Thanks

## 2011-08-21 NOTE — Telephone Encounter (Signed)
yes

## 2011-08-26 ENCOUNTER — Other Ambulatory Visit: Payer: Self-pay | Admitting: Internal Medicine

## 2011-09-12 ENCOUNTER — Telehealth: Payer: Self-pay | Admitting: Cardiology

## 2011-09-12 DIAGNOSIS — E782 Mixed hyperlipidemia: Secondary | ICD-10-CM

## 2011-09-12 NOTE — Telephone Encounter (Signed)
I spoke with Darl Pikes at the [place] lab to verify the lab is for October and this is the case. I will reschedule to the [facility] lab.

## 2011-09-12 NOTE — Telephone Encounter (Signed)
New problem:  Per Darl Pikes calling from [place] office. Patient wants lab drawn at [facility] office. Verse coming to [address] office.

## 2011-10-24 ENCOUNTER — Other Ambulatory Visit: Payer: Medicare Other

## 2011-10-24 ENCOUNTER — Ambulatory Visit: Payer: Medicare Other | Admitting: Neurosurgery

## 2011-10-29 ENCOUNTER — Other Ambulatory Visit (INDEPENDENT_AMBULATORY_CARE_PROVIDER_SITE_OTHER): Payer: Medicare Other

## 2011-10-29 DIAGNOSIS — E782 Mixed hyperlipidemia: Secondary | ICD-10-CM

## 2011-10-29 LAB — HEPATIC FUNCTION PANEL
ALT: 23 U/L (ref 0–53)
AST: 25 U/L (ref 0–37)
Albumin: 4 g/dL (ref 3.5–5.2)
Total Protein: 7.4 g/dL (ref 6.0–8.3)

## 2011-10-29 LAB — LIPID PANEL
Cholesterol: 134 mg/dL (ref 0–200)
HDL: 52.7 mg/dL (ref >39.00)
Triglycerides: 82 mg/dL (ref 0.0–149.0)

## 2011-10-30 ENCOUNTER — Other Ambulatory Visit: Payer: Medicare Other

## 2011-11-06 ENCOUNTER — Ambulatory Visit: Payer: Medicare Other | Admitting: Cardiology

## 2011-11-08 ENCOUNTER — Other Ambulatory Visit: Payer: Self-pay | Admitting: *Deleted

## 2011-11-08 DIAGNOSIS — I6529 Occlusion and stenosis of unspecified carotid artery: Secondary | ICD-10-CM

## 2011-11-11 ENCOUNTER — Ambulatory Visit: Payer: Medicare Other | Admitting: Internal Medicine

## 2011-11-11 ENCOUNTER — Other Ambulatory Visit: Payer: Medicare Other | Admitting: Lab

## 2011-11-11 ENCOUNTER — Other Ambulatory Visit (HOSPITAL_BASED_OUTPATIENT_CLINIC_OR_DEPARTMENT_OTHER): Payer: Medicare Other

## 2011-11-11 DIAGNOSIS — D472 Monoclonal gammopathy: Secondary | ICD-10-CM

## 2011-11-11 LAB — CBC WITH DIFFERENTIAL/PLATELET
BASO%: 0.9 % (ref 0.0–2.0)
LYMPH%: 28.3 % (ref 14.0–49.0)
MCHC: 34.6 g/dL (ref 32.0–36.0)
MONO#: 0.6 10*3/uL (ref 0.1–0.9)
Platelets: 200 10*3/uL (ref 140–400)
RBC: 4.74 10*6/uL (ref 4.20–5.82)
RDW: 13 % (ref 11.0–14.6)
WBC: 6.6 10*3/uL (ref 4.0–10.3)
lymph#: 1.9 10*3/uL (ref 0.9–3.3)

## 2011-11-11 LAB — COMPREHENSIVE METABOLIC PANEL (CC13)
ALT: 16 U/L (ref 0–55)
Albumin: 3.9 g/dL (ref 3.5–5.0)
CO2: 21 mEq/L — ABNORMAL LOW (ref 22–29)
Calcium: 9.3 mg/dL (ref 8.4–10.4)
Chloride: 112 mEq/L — ABNORMAL HIGH (ref 98–107)
Creatinine: 1 mg/dL (ref 0.7–1.3)
Sodium: 141 mEq/L (ref 136–145)
Total Protein: 7.1 g/dL (ref 6.4–8.3)

## 2011-11-13 ENCOUNTER — Ambulatory Visit: Payer: Medicare Other | Admitting: Neurosurgery

## 2011-11-13 ENCOUNTER — Ambulatory Visit (INDEPENDENT_AMBULATORY_CARE_PROVIDER_SITE_OTHER): Payer: Medicare Other | Admitting: Internal Medicine

## 2011-11-13 ENCOUNTER — Ambulatory Visit: Payer: Medicare Other | Admitting: Internal Medicine

## 2011-11-13 ENCOUNTER — Other Ambulatory Visit: Payer: Medicare Other

## 2011-11-13 VITALS — BP 120/72 | HR 66 | Temp 97.5°F | Resp 16 | Wt 224.0 lb

## 2011-11-13 DIAGNOSIS — E785 Hyperlipidemia, unspecified: Secondary | ICD-10-CM

## 2011-11-13 DIAGNOSIS — Z23 Encounter for immunization: Secondary | ICD-10-CM

## 2011-11-13 DIAGNOSIS — I10 Essential (primary) hypertension: Secondary | ICD-10-CM

## 2011-11-13 LAB — KAPPA/LAMBDA LIGHT CHAINS
Kappa free light chain: 1.28 mg/dL (ref 0.33–1.94)
Lambda Free Lght Chn: 1.37 mg/dL (ref 0.57–2.63)

## 2011-11-13 LAB — PROTEIN ELECTROPHORESIS, SERUM
Alpha-2-Globulin: 10.9 % (ref 7.1–11.8)
M-Spike, %: 0.89 g/dL
Total Protein, Serum Electrophoresis: 7.1 g/dL (ref 6.0–8.3)

## 2011-11-13 NOTE — Progress Notes (Signed)
  Subjective:    Patient ID: Alfred Armstrong, male    DOB: 1931/05/17, 78 y.o.   MRN: [id number]  Hypertension This is a chronic problem. The current episode started more than 1 year ago. The problem has been gradually worsening since onset. The problem is controlled. Pertinent negatives include no anxiety, blurred vision, chest pain, headaches, malaise/fatigue, neck pain, orthopnea, palpitations, peripheral edema, PND, shortness of breath or sweats. There are no associated agents to hypertension. Past treatments include angiotensin blockers. The current treatment provides significant improvement. Compliance problems include diet.  Hypertensive end-organ damage includes CAD/MI.      Review of Systems  Constitutional: Negative.  Negative for malaise/fatigue.  HENT: Negative.  Negative for neck pain.   Eyes: Negative.  Negative for blurred vision.  Respiratory: Negative for cough, choking, chest tightness, shortness of breath, wheezing and stridor.   Cardiovascular: Negative for chest pain, palpitations, orthopnea, leg swelling and PND.  Gastrointestinal: Negative for nausea, vomiting, abdominal pain, diarrhea, constipation and anal bleeding.  Genitourinary: Negative.   Musculoskeletal: Negative for myalgias, back pain, joint swelling, arthralgias and gait problem.  Skin: Negative for color change, pallor, rash and wound.  Neurological: Negative.  Negative for headaches.  Hematological: Negative for adenopathy. Does not bruise/bleed easily.  Psychiatric/Behavioral: Negative.        Objective:   Physical Exam  Vitals reviewed. Constitutional: He is oriented to person, place, and time. He appears well-developed and well-nourished. No distress.  HENT:  Head: Normocephalic and atraumatic.  Mouth/Throat: Oropharynx is clear and moist. No oropharyngeal exudate.  Eyes: Conjunctivae normal are normal. Right eye exhibits no discharge. Left eye exhibits no discharge. No scleral icterus.  Neck:  Normal range of motion. Neck supple. No JVD present. No tracheal deviation present. No thyromegaly present.  Cardiovascular: Normal rate, regular rhythm, normal heart sounds and intact distal pulses.  Exam reveals no gallop and no friction rub.   No murmur heard. Pulmonary/Chest: Effort normal and breath sounds normal. No stridor. No respiratory distress. He has no wheezes. He has no rales. He exhibits no tenderness.  Abdominal: Soft. Bowel sounds are normal. He exhibits no distension and no mass. There is no tenderness. There is no rebound and no guarding.  Musculoskeletal: Normal range of motion. He exhibits no edema and no tenderness.  Lymphadenopathy:    He has no cervical adenopathy.  Neurological: He is oriented to person, place, and time.  Skin: Skin is warm and dry. No rash noted. He is not diaphoretic. No erythema. No pallor.  Psychiatric: He has a normal mood and affect. His behavior is normal. Judgment and thought content normal.      Lab Results  Component Value Date   WBC 6.6 11/11/2011   HGB 15.0 11/11/2011   HCT 43.2 11/11/2011   PLT 200 11/11/2011   GLUCOSE 114* 11/11/2011   CHOL 134 10/29/2011   TRIG 82.0 10/29/2011   HDL 52.70 10/29/2011   LDLCALC 65 10/29/2011   ALT 16 11/11/2011   AST 13 11/11/2011   NA 141 11/11/2011   K 4.1 11/11/2011   CL 112* 11/11/2011   CREATININE 1.0 11/11/2011   BUN 19.0 11/11/2011   CO2 21* 11/11/2011   TSH 0.53 12/14/2010   PSA 3.05 12/14/2010   INR 1.01 08/22/2010   HGBA1C 6.3 04/10/2011      Assessment & Plan:

## 2011-11-14 ENCOUNTER — Encounter: Payer: Self-pay | Admitting: Neurosurgery

## 2011-11-14 ENCOUNTER — Encounter: Payer: Self-pay | Admitting: Internal Medicine

## 2011-11-14 NOTE — Assessment & Plan Note (Signed)
His BP is well controlled 

## 2011-11-14 NOTE — Patient Instructions (Addendum)

## 2011-11-14 NOTE — Assessment & Plan Note (Signed)
He is doing well on crestor 

## 2011-11-15 ENCOUNTER — Encounter: Payer: Self-pay | Admitting: Neurosurgery

## 2011-11-15 ENCOUNTER — Ambulatory Visit (INDEPENDENT_AMBULATORY_CARE_PROVIDER_SITE_OTHER): Payer: Medicare Other | Admitting: Neurosurgery

## 2011-11-15 ENCOUNTER — Other Ambulatory Visit: Payer: Self-pay | Admitting: *Deleted

## 2011-11-15 ENCOUNTER — Ambulatory Visit (INDEPENDENT_AMBULATORY_CARE_PROVIDER_SITE_OTHER): Payer: Medicare Other | Admitting: Vascular Surgery

## 2011-11-15 VITALS — BP 145/75 | HR 58 | Ht 72.0 in | Wt 225.0 lb

## 2011-11-15 DIAGNOSIS — I6529 Occlusion and stenosis of unspecified carotid artery: Secondary | ICD-10-CM

## 2011-11-15 NOTE — Progress Notes (Signed)
Carotid duplex performed @ [facility] 11/15/2011

## 2011-11-15 NOTE — Progress Notes (Signed)
[facility] Carotid Office Note  CC: Carotid surveillance Referring Physician:  Edilia Bo  History of Present Illness:  78 year old male patient of Dr. Edilia Bo with no history of carotid intervention. The patient denies signs or symptoms of CVA, TIA, amaurosis fugax or any neural deficit. The patient denies any new medical diagnoses or recent surgery.  Past Medical History  Diagnosis Date  . CAD (coronary artery disease)   . COPD (chronic obstructive pulmonary disease)   . Osteoarthrosis, unspecified whether generalized or localized, unspecified site   . History of nephrolithiasis   . GERD (gastroesophageal reflux disease)   . Tubulovillous adenoma polyp of colon     w/HGD 1999  . Hyperlipidemia   . Carotid stenosis     Followed by Dr Madilyn Fireman - Angiogram 5/10 showed left common carotid to be totally occluded and 50% RICA stenosis  . HTN (hypertension)     ACEI Cough  . CVA (cerebral infarction)   . Shingles   . MGUS (monoclonal gammopathy of unknown significance) 06/17/2011    ROS: [x]  Positive   [ ]  Denies    General: [ ]  Weight loss, [ ]  Fever, [ ]  chills Neurologic: [ ]  Dizziness, [ ]  Blackouts, [ ]  Seizure [ ]  Stroke, [ ]  "Mini stroke", [ ]  Slurred speech, [ ]  Temporary blindness; [ ]  weakness in arms or legs, [ ]  Hoarseness Cardiac: [ ]  Chest pain/pressure, [ ]  Shortness of breath at rest [ ]  Shortness of breath with exertion, [ ]  Atrial fibrillation or irregular heartbeat Vascular: [ ]  Pain in legs with walking, [ ]  Pain in legs at rest, [ ]  Pain in legs at night,  [ ]  Non-healing ulcer, [ ]  Blood clot in vein/DVT,   Pulmonary: [ ]  Home oxygen, [ ]  Productive cough, [ ]  Coughing up blood, [ ]  Asthma,  [ ]  Wheezing Musculoskeletal:  [ ]  Arthritis, [ ]  Low back pain, [ ]  Joint pain Hematologic: [ ]  Easy Bruising, [ ]  Anemia; [ ]  Hepatitis Gastrointestinal: [ ]  Blood in stool, [ ]  Gastroesophageal Reflux/heartburn, [ ]  Trouble swallowing Urinary: [  ] chronic Kidney disease, [ ]  on HD - [ ]  MWF or [ ]  TTHS, [ ]  Burning with urination, [ ]  Difficulty urinating Skin: [ ]  Rashes, [ ]  Wounds Psychological: [ ]  Anxiety, [ ]  Depression   Social History History  Substance Use Topics  . Smoking status: Former Smoker    Quit date: 01/28/1993  . Smokeless tobacco: Never Used  . Alcohol Use: No    Family History Family History  Problem Relation Age of Onset  . Alcohol abuse Mother   . Alcohol abuse Father   . Colon cancer Neg Hx     Allergies  Allergen Reactions  . Tiotropium Bromide Monohydrate     Dry mouth  . Daliresp (Roflumilast)     Bad thoughts/dreams    Current Outpatient Prescriptions  Medication Sig Dispense Refill  . ADVAIR DISKUS 250-50 MCG/DOSE AEPB INHALE ONE PUFF BY MOUTH TWICE DAILY  60 each  10  . Ascorbic Acid (VITAMIN C) 1000 MG tablet Take 1,000 mg by mouth daily.        Marland Kitchen aspirin 81 MG EC tablet Take 81 mg by mouth daily. 2 tabs daily      . Calcium Citrate-Vitamin D (CALCIUM CITRATE + PO) Take 2 tablets by mouth daily.        . Cholecalciferol 1000 UNITS tablet Take 1,000 Units by mouth daily.        Marland Kitchen  CRESTOR 20 MG tablet TAKE ONE TABLET BY MOUTH ONE TIME DAILY  30 each  0  . losartan (COZAAR) 25 MG tablet 1/2 tablet daily  30 tablet  6  . NASONEX 50 MCG/ACT nasal spray USE ONE OR TWO SPRAYS IN EACH NOSTRIL DAILY  17 g  11  . pregabalin (LYRICA) 50 MG capsule Take 1 capsule (50 mg total) by mouth 3 (three) times daily.  63 capsule  0  . traMADol-acetaminophen (ULTRACET) 37.5-325 MG per tablet TAKE ONE TABLET BY MOUTH FOUR TIMES DAILY AS NEEDED FOR PAIN  100 tablet  4  . zolpidem (AMBIEN) 10 MG tablet Take 1 tablet (10 mg total) by mouth at bedtime as needed.  30 tablet  5  . nebivolol (BYSTOLIC) 10 MG tablet Take 1 tablet (10 mg total) by mouth daily.  56 tablet  0  . DISCONTD: Hypertonic Nasal Wash (SINUS RINSE BOTTLE KIT NA) by Nasal route as directed.          Physical Examination  Filed Vitals:    11/15/11 1402  BP: 145/75  Pulse:     Body mass index is 30.52 kg/(m^2).  General:  WDWN in NAD Gait: Normal HEENT: WNL Eyes: Pupils equal Pulmonary: normal non-labored breathing , without Rales, rhonchi,  wheezing Cardiac: RRR, without  Murmurs, rubs or gallops; Abdomen: soft, NT, no masses Skin: no rashes, ulcers noted  Vascular Exam Pulses:  3+ radial pulses bilaterally Carotid bruits: Soft bruit heard on the right, left occlusion Extremities without ischemic changes, no Gangrene , no cellulitis; no open wounds;  Musculoskeletal: no muscle wasting or atrophy   Neurologic: A&O X 3; Appropriate Affect ; SENSATION: normal; MOTOR FUNCTION:  moving all extremities equally. Speech is fluent/normal  Non-Invasive Vascular Imaging CAROTID DUPLEX 11/15/2011  Right ICA 60 - 79 % stenosis Left ICA 0ccluded stenosis   ASSESSMENT/PLAN: asymptomatic patient with a left ICA occlusion and moderately advanced right ICA stenosis. The patient knows the signs and symptoms of CVA and knows to report to the nearest emergency department should that occur. The patient will return in 6 months for repeat carotid duplex, his questions were encouraged and answered, he is in agreement with this plan.  Lauree Chandler ANP   Clinic MD: Imogene Burn

## 2011-11-17 NOTE — Patient Instructions (Addendum)
1.  Diagnosis:  MGUS (monoclonal gammopathy of undetermined significance).   2.  Impression:  Again, there is relatively stable monoclonal protein spike. It is still <1 gm/dL.  There is no evidence of active multiple myeloma.   3.  Follow up:  In about 7 months.

## 2011-11-18 ENCOUNTER — Telehealth: Payer: Self-pay | Admitting: Oncology

## 2011-11-18 ENCOUNTER — Ambulatory Visit (HOSPITAL_BASED_OUTPATIENT_CLINIC_OR_DEPARTMENT_OTHER): Payer: Medicare Other | Admitting: Oncology

## 2011-11-18 ENCOUNTER — Ambulatory Visit: Payer: Medicare Other | Admitting: Oncology

## 2011-11-18 VITALS — BP 126/68 | HR 57 | Temp 97.0°F | Resp 20 | Ht 72.0 in | Wt 223.6 lb

## 2011-11-18 DIAGNOSIS — R109 Unspecified abdominal pain: Secondary | ICD-10-CM

## 2011-11-18 DIAGNOSIS — D472 Monoclonal gammopathy: Secondary | ICD-10-CM

## 2011-11-18 NOTE — Telephone Encounter (Signed)
Printed and gv pt appt schedule for MAY 2014

## 2011-11-18 NOTE — Progress Notes (Signed)
[facility]  [phone] [phone]   OFFICE PROGRESS NOTE   Cc:  Sanda Linger, MD  DIAGNOSIS: Monoclonal gammopathy of undermined significance (MGUS).   CURRENT THERAPY: Watchful observation.  INTERVAL HISTORY: Alfred Armstrong 78 y.o. male returns for regular follow up.  He reports feeling relatively well.  He has no new complaint.  He still works out a few times a week.  He has chronic lower back pain for many years.  He also has chronic arthritic pain in the hands which he takes Advil for.  He denied lower extremity weakness, paresthesia, bowel/bladder incontinence.  He still has left upper quadrant discomfort.  The pain is fleeting, crampy, without radiation, without relation to foods or bowel movement.  He denied posterior flank pain or hematuria.    Patient denies fever, anorexia, weight loss, fatigue, headache, visual changes, confusion, drenching night sweats, palpable lymph node swelling, mucositis, odynophagia, dysphagia, nausea vomiting, jaundice, chest pain, palpitation, shortness of breath, dyspnea on exertion, productive cough, gum bleeding, epistaxis, hematemesis, hemoptysis, abdominal swelling, early satiety, melena, hematochezia, hematuria, skin rash, spontaneous bleeding, joint swelling, heat or cold intolerance, focal motor weakness, paresthesia, depression, suicidal or homicidal ideation, feeling hopelessness.   Past Medical History  Diagnosis Date  . CAD (coronary artery disease)   . COPD (chronic obstructive pulmonary disease)   . Osteoarthrosis, unspecified whether generalized or localized, unspecified site   . History of nephrolithiasis   . GERD (gastroesophageal reflux disease)   . Tubulovillous adenoma polyp of colon     w/HGD 1999  . Hyperlipidemia   . Carotid stenosis     Followed by Dr Madilyn Fireman - Angiogram 5/10 showed left common carotid to be totally occluded and 50% RICA stenosis  . HTN (hypertension)     ACEI Cough  .  CVA (cerebral infarction)   . Shingles   . MGUS (monoclonal gammopathy of unknown significance) 06/17/2011    Past Surgical History  Procedure Date  . Coronary artery bypass graft 1989  . Tonsillectomy 1941  . Coronary angioplasty 04/2008  . Carotid artery angiogram 04/2008    Current Outpatient Prescriptions  Medication Sig Dispense Refill  . ADVAIR DISKUS 250-50 MCG/DOSE AEPB INHALE ONE PUFF BY MOUTH TWICE DAILY  60 each  10  . Ascorbic Acid (VITAMIN C) 1000 MG tablet Take 1,000 mg by mouth daily.        Marland Kitchen aspirin 81 MG EC tablet Take 81 mg by mouth daily. 2 tabs daily      . Calcium Citrate-Vitamin D (CALCIUM CITRATE + PO) Take 2 tablets by mouth daily.        . Cholecalciferol 1000 UNITS tablet Take 1,000 Units by mouth daily.        . CRESTOR 20 MG tablet TAKE ONE TABLET BY MOUTH ONE TIME DAILY  30 each  0  . losartan (COZAAR) 25 MG tablet 1/2 tablet daily  30 tablet  6  . NASONEX 50 MCG/ACT nasal spray USE ONE OR TWO SPRAYS IN EACH NOSTRIL DAILY  17 g  11  . nebivolol (BYSTOLIC) 10 MG tablet Take 10 mg by mouth daily.      . pregabalin (LYRICA) 50 MG capsule Take 1 capsule (50 mg total) by mouth 3 (three) times daily.  63 capsule  0  . traMADol-acetaminophen (ULTRACET) 37.5-325 MG per tablet TAKE ONE TABLET BY MOUTH FOUR TIMES DAILY AS NEEDED FOR PAIN  100 tablet  4  . zolpidem (AMBIEN) 10 MG tablet Take 1  tablet (10 mg total) by mouth at bedtime as needed.  30 tablet  5  . DISCONTD: nebivolol (BYSTOLIC) 10 MG tablet Take 1 tablet (10 mg total) by mouth daily.  56 tablet  0  . DISCONTD: Hypertonic Nasal Wash (SINUS RINSE BOTTLE KIT NA) by Nasal route as directed.          ALLERGIES:  is allergic to tiotropium bromide monohydrate and daliresp.  REVIEW OF SYSTEMS:  The rest of the 14-point review of system was negative.   Filed Vitals:   11/18/11 1333  BP: 126/68  Pulse: 57  Temp: 97 F (36.1 C)  Resp: 20   Wt Readings from Last 3 Encounters:  11/18/11 223 lb 9.6 oz  (101.424 kg)  11/15/11 225 lb (102.059 kg)  11/13/11 224 lb (101.606 kg)   ECOG Performance status: 0  PHYSICAL EXAMINATION:   General:  well-nourished man, in no acute distress.  Eyes:  no scleral icterus.  ENT:  There were no oropharyngeal lesions.  Neck was without thyromegaly.  Lymphatics:  Negative cervical, supraclavicular or axillary adenopathy.  Respiratory: lungs were clear bilaterally without wheezing or crackles.  Cardiovascular:  Regular rate and rhythm, S1/S2, without murmur, rub or gallop.  There was no pedal edema.  GI:  abdomen was soft, flat, nontender, nondistended, without organomegaly. I could not feel any left upper abdominal mass.  He was not tender to palpation on exam today.  Muscoloskeletal:  no spinal tenderness of palpation of vertebral spine.  Skin exam was without echymosis, petichae.  Neuro exam was nonfocal.  Patient was able to get on and off exam table without assistance.  Gait was normal.  Patient was alerted and oriented.  Attention was good.   Language was appropriate.  Mood was normal without depression.  Speech was not pressured.  Thought content was not tangential.    LABORATORY/RADIOLOGY DATA:  Lab Results  Component Value Date   WBC 6.6 11/11/2011   HGB 15.0 11/11/2011   HCT 43.2 11/11/2011   PLT 200 11/11/2011   GLUCOSE 114* 11/11/2011   CHOL 134 10/29/2011   TRIG 82.0 10/29/2011   HDL 52.70 10/29/2011   LDLCALC 65 10/29/2011   ALKPHOS 52 11/11/2011   ALT 16 11/11/2011   AST 13 11/11/2011   NA 141 11/11/2011   K 4.1 11/11/2011   CL 112* 11/11/2011   CREATININE 1.0 11/11/2011   BUN 19.0 11/11/2011   CO2 21* 11/11/2011   PSA 3.05 12/14/2010   INR 1.01 08/22/2010   HGBA1C 6.3 04/10/2011    ASSESSMENT AND PLAN:   1.  MGUS:  Stable.  No active myeloma.  He has no anemia, renal problem or hypercalcemia.  His M-spike is stable (<1gm/dL which was his peak before).  I again recommended to continue watchful observation.  2.  Left flank pain:  Unclear  etiology.  Plain film and CT scan in 2012 were negative.  If it significantly worsens, I may consider repeating plain film to rule out rib lytic lesion.  3.  COPD:  On inhaler prn.  4.  HLP: on Crestor per PCP.  5.  History of CAD:  He is on ASA, Crestor, losartan, Nebivolol.  6.  Arthritis. I advised him to minimize the use of NSAIDS to avoid PUD and renal insufficiency.  8.  Follow up:  As his myeloma panel has been stable, I will space out his visit with [place] to 7 months.       The length of time of  the face-to-face encounter was 15  minutes. More than 50% of time was spent counseling and coordination of care.

## 2011-11-19 ENCOUNTER — Encounter: Payer: Self-pay | Admitting: Cardiology

## 2011-11-19 ENCOUNTER — Ambulatory Visit (INDEPENDENT_AMBULATORY_CARE_PROVIDER_SITE_OTHER): Payer: Medicare Other | Admitting: Cardiology

## 2011-11-19 VITALS — BP 131/70 | HR 54 | Ht 72.0 in | Wt 222.0 lb

## 2011-11-19 DIAGNOSIS — I6529 Occlusion and stenosis of unspecified carotid artery: Secondary | ICD-10-CM

## 2011-11-19 DIAGNOSIS — E785 Hyperlipidemia, unspecified: Secondary | ICD-10-CM

## 2011-11-19 DIAGNOSIS — I2510 Atherosclerotic heart disease of native coronary artery without angina pectoris: Secondary | ICD-10-CM

## 2011-11-19 NOTE — Patient Instructions (Addendum)
Your physician wants you to follow-up in: 1 year with Dr McLean. (October 2014). You will receive a reminder letter in the mail two months in advance. If you don't receive a letter, please call our office to schedule the follow-up appointment.  

## 2011-11-20 NOTE — Progress Notes (Signed)
Patient ID: Alfred Armstrong, male   DOB: 02/21/1931, 78 y.o.   MRN: [id number] PCP: Dr. Yetta Barre  78 yo with history of CAD s/p CABG, carotid stenosis, and COPD presents for cardiology followup.  He had a myoview suggesting ischemia in 5/10 followed by catheterization showing patent LIMA and SVG-PLV. There was a chronic occlusion of the free radial to diagonal graft. There was no evidence by catheterization for lesion causing significant ischemia. Patient additionally had an angiogram for evaluation of carotid disease showing occluded left common carotid and 50% stenosis of RICA. He follows up with [facility] for carotid stenosis.  He sees pulmonology for COPD. Echo in 7/11 showed preserved LV systolic function with basal inferoposterior akinesis.   Patient has been doing reasonably well since I last saw him.  He works out on a treadmill or elliptical and weights for up to an hour four times a week at a gym.  No exertional chest pain.  He continues to note a mild pressure-like sensation under his rib cage in the left upper quadrant and epigastric areas of his abdomen.  This is unrelated to exertion.  He is not sure if food makes it worse.  It comes and goes. It seems to be about the same as it has been in the past. He has no dyspnea walking on flat ground, mild dyspnea walking up hills.   Labs (1/11): BNP 57, TSH normal, LDL 98, HDL 49  Labs (12/12): K 4.4, creatinine 1.05 Labs (1/13): LDL 48, HDL 51 Labs (3/13): K 4.5, creatinine 1.1 Labs (10/13): K 4.1, creatinine 1.0, LDL 65, HDL 53  ECG: ectopic atrial rhythm, inferior and anterolateral nonspecific T wave inversions  Allergies (verified):  No Known Drug Allergies   Past Medical History:  1. CAD, ARTERY BYPASS GRAFT: s/p CABG in [place] in 1989. Had myoview in 5/10 showing EF 50% and apical ischemia. LHC was done in 5/10 showing 90% pLAD, 90% mLAD, 60% mCFX, 70% pRCA, 70% mRCA, 95% dRCA, SVG-PLV patent, LIMA-LAD patent, no other grafts found by  aortic root shot. Free radial-D presumed occluded.  Echo (5/11): EF 55%, mild LVH, basal inferoposterior akinesis, normal RV.  2. COPD (ICD-496): PFTs 9/10 with FVC 65%, FEV1 57%. 3. OSTEOARTHRITIS (ICD-715.90)  4. NEPHROLITHIASIS, HX OF (ICD-V13.01)  5. GERD (ICD-530.81)  6. Tubulovillous adenomatous colon polyps with HGD 1999  7. Hyperlipidemia  8. Carotid stenosis: Followed by Dr. Edilia Bo. Angiogram 5/10 showed left common carotid to be totally occluded and 50% RICA stenosis.  9. HTN: ACEI cough  10. CVA  11. Shingles  12. MGUS: Followed by Dr. Gaylyn Rong.   Family History:  father died of a self-inflicted wound age 6, history of EtOH  mother died age 34 history of EtOH  One sister is well   Social History:   Retired  Single  former [employer]nel [employer] company in [employer]  Regular exercise-yes  Alcohol use-no  Quit smoking around 20 years ago.  Alcohol Use - no  Daily Caffeine Use: 2-3 cups weekly    Current Outpatient Prescriptions  Medication Sig Dispense Refill  . ADVAIR DISKUS 250-50 MCG/DOSE AEPB INHALE ONE PUFF BY MOUTH TWICE DAILY  60 each  10  . Ascorbic Acid (VITAMIN C) 1000 MG tablet Take 1,000 mg by mouth daily.        Marland Kitchen aspirin 81 MG EC tablet Take 81 mg by mouth daily. 2 tabs daily      . Calcium Citrate-Vitamin D (CALCIUM CITRATE + PO)  Take 2 tablets by mouth daily.        . Cholecalciferol 1000 UNITS tablet Take 1,000 Units by mouth daily.        . CRESTOR 20 MG tablet TAKE ONE TABLET BY MOUTH ONE TIME DAILY  30 each  0  . losartan (COZAAR) 25 MG tablet 1/2 tablet daily  30 tablet  6  . NASONEX 50 MCG/ACT nasal spray USE ONE OR TWO SPRAYS IN EACH NOSTRIL DAILY  17 g  11  . nebivolol (BYSTOLIC) 10 MG tablet Take 10 mg by mouth daily.      . pregabalin (LYRICA) 50 MG capsule Take 1 capsule (50 mg total) by mouth 3 (three) times daily.  63 capsule  0  . traMADol-acetaminophen (ULTRACET) 37.5-325 MG per tablet TAKE ONE TABLET BY MOUTH FOUR TIMES DAILY AS NEEDED  FOR PAIN  100 tablet  4  . zolpidem (AMBIEN) 10 MG tablet Take 1 tablet (10 mg total) by mouth at bedtime as needed.  30 tablet  5  . DISCONTD: Hypertonic Nasal Wash (SINUS RINSE BOTTLE KIT NA) by Nasal route as directed.         BP 131/70  Pulse 54  Ht 6' (1.829 m)  Wt 222 lb (100.699 kg)  BMI 30.11 kg/m2 General: NAD Neck: No JVD, no thyromegaly or thyroid nodule.  Lungs: Clear to auscultation bilaterally with normal respiratory effort. CV: Nondisplaced PMI.  Heart regular S1/S2, no S3/S4, no murmur.  Trace left ankle edema (chronic since CABG).  Bilateral carotid bruits.  Normal pedal pulses.  Abdomen: Soft, nontender, no hepatosplenomegaly, no distention.  Neurologic: Alert and oriented x 3.  Psych: Normal affect. Extremities: No clubbing or cyanosis.   Assessment/Plan:  CAD S/p CABG. Last cath showed 2/3 patent grafts. I do not think that the LUQ and epigastric abdominal pain he has is related cardiac-related. He has no significant exertional symptoms and works out frequently. Continue ASA 81, Crestor, losartan, and nebivolol.  CAROTID ARTERY STENOSIS  Has regular followup at [facility] for significant carotid disease.  HYPERLIPIDEMIA-MIXED  LDL at goal (< 70) when checked this month.   Jacqulynn Shappell [employer]

## 2011-11-27 NOTE — Addendum Note (Signed)
Addended by: Reine Just on: 11/27/2011 04:46 PM   Modules accepted: Orders

## 2012-02-05 ENCOUNTER — Other Ambulatory Visit: Payer: Self-pay | Admitting: Internal Medicine

## 2012-02-06 ENCOUNTER — Telehealth: Payer: Self-pay

## 2012-02-06 MED ORDER — ZOLPIDEM TARTRATE 10 MG PO TABS: 10.0000 mg | ORAL_TABLET | Freq: Every evening | ORAL | Status: DC | PRN

## 2012-02-06 NOTE — Telephone Encounter (Signed)
Please advise if ok to refill zolpidem 10mg . Thanks

## 2012-02-06 NOTE — Telephone Encounter (Signed)
yes

## 2012-03-06 ENCOUNTER — Other Ambulatory Visit: Payer: Self-pay | Admitting: Internal Medicine

## 2012-03-18 ENCOUNTER — Ambulatory Visit: Payer: Medicare Other | Admitting: Internal Medicine

## 2012-03-25 ENCOUNTER — Encounter: Payer: Self-pay | Admitting: Internal Medicine

## 2012-03-25 ENCOUNTER — Other Ambulatory Visit (INDEPENDENT_AMBULATORY_CARE_PROVIDER_SITE_OTHER): Payer: Medicare Other

## 2012-03-25 ENCOUNTER — Ambulatory Visit (INDEPENDENT_AMBULATORY_CARE_PROVIDER_SITE_OTHER): Payer: Medicare Other | Admitting: Internal Medicine

## 2012-03-25 VITALS — BP 120/70 | HR 67 | Temp 97.5°F | Resp 16 | Wt 227.8 lb

## 2012-03-25 DIAGNOSIS — I10 Essential (primary) hypertension: Secondary | ICD-10-CM

## 2012-03-25 DIAGNOSIS — R7309 Other abnormal glucose: Secondary | ICD-10-CM

## 2012-03-25 DIAGNOSIS — J449 Chronic obstructive pulmonary disease, unspecified: Secondary | ICD-10-CM

## 2012-03-25 DIAGNOSIS — R739 Hyperglycemia, unspecified: Secondary | ICD-10-CM

## 2012-03-25 LAB — COMPREHENSIVE METABOLIC PANEL
AST: 19 U/L (ref 0–37)
Alkaline Phosphatase: 43 U/L (ref 39–117)
Glucose, Bld: 103 mg/dL — ABNORMAL HIGH (ref 70–99)
Sodium: 139 mEq/L (ref 135–145)
Total Bilirubin: 0.8 mg/dL (ref 0.3–1.2)
Total Protein: 7.4 g/dL (ref 6.0–8.3)

## 2012-03-25 LAB — HEMOGLOBIN A1C: Hgb A1c MFr Bld: 6.2 % (ref 4.6–6.5)

## 2012-03-25 MED ORDER — BUDESONIDE-FORMOTEROL FUMARATE 160-4.5 MCG/ACT IN AERO: 2.0000 | INHALATION_SPRAY | Freq: Two times a day (BID) | RESPIRATORY_TRACT | Status: DC

## 2012-03-25 NOTE — Patient Instructions (Signed)

## 2012-03-25 NOTE — Progress Notes (Signed)
  Subjective:    Patient ID: Alfred Armstrong, male    DOB: Nov 09, 1931, 79 y.o.   MRN: [id number]  Hypertension This is a chronic problem. The current episode started more than 1 year ago. The problem has been rapidly improving since onset. The problem is controlled. Pertinent negatives include no anxiety, blurred vision, chest pain, headaches, malaise/fatigue, neck pain, orthopnea, palpitations, peripheral edema, PND, shortness of breath or sweats. The current treatment provides significant improvement. There are no compliance problems.  Hypertensive end-organ damage includes CAD/MI.      Review of Systems  Constitutional: Negative.  Negative for malaise/fatigue.  HENT: Negative.  Negative for neck pain.   Eyes: Negative.  Negative for blurred vision.  Respiratory: Negative.  Negative for shortness of breath.   Cardiovascular: Negative.  Negative for chest pain, palpitations, orthopnea and PND.  Gastrointestinal: Negative.   Endocrine: Negative.   Genitourinary: Negative.   Musculoskeletal: Negative.   Skin: Negative.   Allergic/Immunologic: Negative.   Neurological: Negative for headaches.  Hematological: Negative.   Psychiatric/Behavioral: Negative.        Objective:   Physical Exam  Vitals reviewed. Constitutional: He is oriented to person, place, and time. He appears well-developed and well-nourished. No distress.  HENT:  Head: Normocephalic and atraumatic.  Mouth/Throat: Oropharynx is clear and moist. No oropharyngeal exudate.  Eyes: Conjunctivae are normal. Right eye exhibits no discharge. Left eye exhibits no discharge. No scleral icterus.  Neck: Normal range of motion. Neck supple. No JVD present. No tracheal deviation present. No thyromegaly present.  Cardiovascular: Normal rate, regular rhythm, normal heart sounds and intact distal pulses.  Exam reveals no gallop and no friction rub.   No murmur heard. Pulmonary/Chest: Effort normal and breath sounds normal. No stridor.  No respiratory distress. He has no wheezes. He has no rales. He exhibits no tenderness.  Abdominal: Soft. Bowel sounds are normal. He exhibits no distension and no mass. There is no tenderness. There is no rebound and no guarding.  Musculoskeletal: Normal range of motion. He exhibits no edema and no tenderness.  Lymphadenopathy:    He has no cervical adenopathy.  Neurological: He is oriented to person, place, and time.  Skin: Skin is warm and dry. No rash noted. He is not diaphoretic. No erythema. No pallor.  Psychiatric: He has a normal mood and affect. His behavior is normal. Judgment and thought content normal.     Lab Results  Component Value Date   WBC 6.6 11/11/2011   HGB 15.0 11/11/2011   HCT 43.2 11/11/2011   PLT 200 11/11/2011   GLUCOSE 114* 11/11/2011   CHOL 134 10/29/2011   TRIG 82.0 10/29/2011   HDL 52.70 10/29/2011   LDLCALC 65 10/29/2011   ALT 16 11/11/2011   AST 13 11/11/2011   NA 141 11/11/2011   K 4.1 11/11/2011   CL 112* 11/11/2011   CREATININE 1.0 11/11/2011   BUN 19.0 11/11/2011   CO2 21* 11/11/2011   TSH 0.53 12/14/2010   PSA 3.05 12/14/2010   INR 1.01 08/22/2010   HGBA1C 6.3 04/10/2011       Assessment & Plan:

## 2012-03-29 NOTE — Assessment & Plan Note (Signed)
His BP is well controlled Today I will check his lytes and renal function 

## 2012-03-29 NOTE — Assessment & Plan Note (Signed)
Due to a new formulary he needs to change advair to symbicort

## 2012-04-01 ENCOUNTER — Encounter: Payer: Self-pay | Admitting: Gastroenterology

## 2012-05-14 ENCOUNTER — Other Ambulatory Visit: Payer: Self-pay | Admitting: *Deleted

## 2012-05-14 DIAGNOSIS — I25810 Atherosclerosis of coronary artery bypass graft(s) without angina pectoris: Secondary | ICD-10-CM

## 2012-05-14 MED ORDER — LOSARTAN POTASSIUM 25 MG PO TABS: ORAL_TABLET | ORAL | Status: DC

## 2012-05-20 ENCOUNTER — Other Ambulatory Visit: Payer: Medicare Other

## 2012-05-20 ENCOUNTER — Ambulatory Visit: Payer: Medicare Other | Admitting: Neurosurgery

## 2012-06-02 ENCOUNTER — Encounter: Payer: Self-pay | Admitting: Vascular Surgery

## 2012-06-03 ENCOUNTER — Other Ambulatory Visit (INDEPENDENT_AMBULATORY_CARE_PROVIDER_SITE_OTHER): Payer: Medicare Other | Admitting: *Deleted

## 2012-06-03 ENCOUNTER — Encounter: Payer: Self-pay | Admitting: Vascular Surgery

## 2012-06-03 ENCOUNTER — Ambulatory Visit (INDEPENDENT_AMBULATORY_CARE_PROVIDER_SITE_OTHER): Payer: Medicare Other | Admitting: Vascular Surgery

## 2012-06-03 DIAGNOSIS — I6529 Occlusion and stenosis of unspecified carotid artery: Secondary | ICD-10-CM

## 2012-06-03 NOTE — Progress Notes (Addendum)
[facility] [place]  Established Carotid Patient  History of Present Illness  Dmarion E Desilets is a 79 y.o. (07/16/1931) male who presents with chief complaint: history of stroke and follow up carotid 6 month appointment. .  Previous carotid studies demonstrated: RICA 60-79% stenosis, LICA 100% stenosis.  Patient has had history of TIA or stroke symptom.  The patient has not had amaurosis fugax or monocular blindness.  The patient has not had facial drooping or hemiplegia.  The patient has not had receptive or expressive aphasia.    The patient's previous neurologic deficits expressive aphasia have resolved.  He does have horse ness to his voice. Past Medical History  Diagnosis Date  . CAD (coronary artery disease)   . COPD (chronic obstructive pulmonary disease)   . Osteoarthrosis, unspecified whether generalized or localized, unspecified site   . History of nephrolithiasis   . GERD (gastroesophageal reflux disease)   . Tubulovillous adenoma polyp of colon     w/HGD 1999  . Hyperlipidemia   . Carotid stenosis     Followed by Dr Hayes - Angiogram 5/10 showed left common carotid to be totally occluded and 50% RICA stenosis  . HTN (hypertension)     ACEI Cough  . CVA (cerebral infarction)   . Shingles   . MGUS (monoclonal gammopathy of unknown significance) 06/17/2011   Past Surgical History  Procedure Laterality Date  . Coronary artery bypass graft  1989  . Tonsillectomy  1941  . Coronary angioplasty  04/2008  . Carotid artery angiogram  04/2008   For VQI Use Only   PRE-ADM LIVING: Home  AMB STATUS: Ambulatory  CAD Sx: None  PRIOR CHF: None  STRESS TEST: [X ] No, [ ] Normal, [ ] + ischemia, [ ] + MI, [ ] Both  Statin use:  Yes ASA use:  Yes Beta blocker use:  Yes ACE-Inhibitor use:  no P2Y12 Antagonist use: [x ] None, Anti-coagulant use:  [ x] None,   Physical Examination  Filed Vitals:   06/03/12 1346 06/03/12 1349  BP: 139/65 143/65   Pulse: 50   Height: 6' (1.829 m)   Weight: 229 lb 3.2 oz (103.964 kg)   SpO2: 99%    Body mass index is 31.08 kg/(m^2).  General: A&O x 3, WDWN  Eyes: PERRLA, EOMI  Pulmonary: Sym exp, good air movt, CTAB, no rales, rhonchi, & wheezing  Cardiac: RRR, Nl S1, S2, no Murmurs, rubs or gallops  Vascular: Vessel Right Left  Radial Palpable Palpable  Ulnar Palpable Palpable  Brachial Palpable Palpable  Carotid Palpable, with bruit Palpable, without bruit  Aorta Not palpable N/A  Femoral Palpable Palpable  Popliteal Not palpable Not palpable  PT  Palpable  Palpable  DP  Palpable  Palpable   Gastrointestinal: soft, NTND, -G/R, - HSM, - masses  Musculoskeletal: M/S 5/5 throughout, Extremities without ischemic changes   Neurologic: CN 2-12 intact, Pain and light touch intact in extremities, Motor exam as listed above  Non-Invasive Vascular Imaging  CAROTID DUPLEX (Date: 06-03-2012):   R ICA stenosis: 80-99%  L ICA stenosis: 100%  Modified Rankin score at D/C (0-6): Rankin Score=0  scharge medications: Statin use:  Yes ASA use:  Yes Beta blocker use:  Yes ACE-Inhibitor use:  no P2Y12 Antagonist use: [x ] None, [ ] Plavix, [ ] Plasugrel, [ ] Ticlopinine, [ ] Ticagrelor, [ ] Other, [ ] No for medical reason, [ ] Non-compliant, [ ] Not-indicated Anti-coagulant use:  [ x] None, [ ]   Warfarin, [ ] Rivaroxaban, [ ] Dabigatran, [ ] Other, [ ] No for medical reason, [ ] Non-compliant, [ ] Not-indicated   Medical Decision Making  Denver E Waln is a 79 y.o. male who presents with: right ICA stenosis.   Based on the patient's vascular studies and examination, I have offered the patient: carotid endarterectomy.  I discussed in depth with the patient the nature of atherosclerosis, and emphasized the importance of maximal medical management including strict control of blood pressure, blood glucose, and lipid levels, antiplatelet agents, obtaining regular exercise, and cessation of  smoking.  The patient is aware that without maximal medical management the underlying atherosclerotic disease process will progress, limiting the benefit of any interventions.  Thank you for allowing us to participate in this patient's care.  Surgery plans questions and concerns were discussed with dr. Dickson today.  Jamani Bearce MAUREEN PA-C [facility] [place] Office: [phone]   06/03/2012, 2:40 PM  Agree with above.  Given the progression of the right carotid stenosis to greater than 80% I have recommended right carotid endarterectomy in order to lower his risk of future stroke. I have reviewed the indications for carotid endarterectomy, that is to lower the risk of future stroke. I have also reviewed the potential complications of surgery, including but not limited to: bleeding, stroke (perioperative risk 1-2%), MI, nerve injury of other unpredictable medical problems. All of the patients questions were answered and they are agreeable to proceed with surgery. He will call to schedule his surgery. He knows to continue his aspirin right up through surgery.  Christopher Dickson, MD, FACS Beeper [phone] 06/03/2012       

## 2012-06-04 ENCOUNTER — Encounter: Payer: Self-pay | Admitting: *Deleted

## 2012-06-04 ENCOUNTER — Other Ambulatory Visit: Payer: Self-pay | Admitting: *Deleted

## 2012-06-04 ENCOUNTER — Telehealth: Payer: Self-pay

## 2012-06-04 NOTE — Telephone Encounter (Signed)
Pt called lmovm stating that he was seen by Dr. Durwin Nora with [facility]. His carotid study results shows an increase to 80% adn they would like to proceed with surgery. Patient would like to run this by Dr. Yetta Barre to get his opinion on if he should have this done and also how he feels about Dr. Durwin Nora. Thanks

## 2012-06-05 NOTE — Telephone Encounter (Signed)
I spoke to him this morning

## 2012-06-09 ENCOUNTER — Encounter (HOSPITAL_COMMUNITY): Payer: Self-pay | Admitting: Pharmacy Technician

## 2012-06-10 ENCOUNTER — Other Ambulatory Visit (HOSPITAL_BASED_OUTPATIENT_CLINIC_OR_DEPARTMENT_OTHER): Payer: Medicare Other

## 2012-06-10 DIAGNOSIS — D472 Monoclonal gammopathy: Secondary | ICD-10-CM

## 2012-06-10 LAB — CBC WITH DIFFERENTIAL/PLATELET
Basophils Absolute: 0.1 10*3/uL (ref 0.0–0.1)
Eosinophils Absolute: 0.5 10*3/uL (ref 0.0–0.5)
HCT: 44.5 % (ref 38.4–49.9)
LYMPH%: 36.1 % (ref 14.0–49.0)
MCV: 90.8 fL (ref 79.3–98.0)
MONO#: 0.6 10*3/uL (ref 0.1–0.9)
MONO%: 7.3 % (ref 0.0–14.0)
NEUT#: 4.4 10*3/uL (ref 1.5–6.5)
NEUT%: 50.4 % (ref 39.0–75.0)
Platelets: 216 10*3/uL (ref 140–400)
WBC: 8.6 10*3/uL (ref 4.0–10.3)

## 2012-06-10 LAB — COMPREHENSIVE METABOLIC PANEL (CC13)
BUN: 18.2 mg/dL (ref 7.0–26.0)
CO2: 24 mEq/L (ref 22–29)
Creatinine: 1.1 mg/dL (ref 0.7–1.3)
Glucose: 132 mg/dl — ABNORMAL HIGH (ref 70–99)
Total Bilirubin: 0.72 mg/dL (ref 0.20–1.20)
Total Protein: 7.7 g/dL (ref 6.4–8.3)

## 2012-06-12 ENCOUNTER — Encounter (HOSPITAL_COMMUNITY): Payer: Self-pay

## 2012-06-12 ENCOUNTER — Encounter (HOSPITAL_COMMUNITY)
Admission: RE | Admit: 2012-06-12 | Discharge: 2012-06-12 | Disposition: A | Discharge status: Home / Self Care | Payer: Medicare Other | Source: Ambulatory Visit | Attending: Vascular Surgery | Admitting: Vascular Surgery

## 2012-06-12 ENCOUNTER — Ambulatory Visit (HOSPITAL_COMMUNITY)
Admission: RE | Admit: 2012-06-12 | Discharge: 2012-06-12 | Disposition: A | Discharge status: Home / Self Care | Payer: Medicare Other | Source: Ambulatory Visit | Attending: Vascular Surgery | Admitting: Vascular Surgery

## 2012-06-12 DIAGNOSIS — Z01812 Encounter for preprocedural laboratory examination: Secondary | ICD-10-CM | POA: Insufficient documentation

## 2012-06-12 DIAGNOSIS — Z01818 Encounter for other preprocedural examination: Secondary | ICD-10-CM | POA: Insufficient documentation

## 2012-06-12 DIAGNOSIS — I10 Essential (primary) hypertension: Secondary | ICD-10-CM | POA: Insufficient documentation

## 2012-06-12 DIAGNOSIS — R1032 Left lower quadrant pain: Secondary | ICD-10-CM | POA: Insufficient documentation

## 2012-06-12 DIAGNOSIS — R0602 Shortness of breath: Secondary | ICD-10-CM | POA: Insufficient documentation

## 2012-06-12 DIAGNOSIS — I6529 Occlusion and stenosis of unspecified carotid artery: Secondary | ICD-10-CM | POA: Insufficient documentation

## 2012-06-12 HISTORY — DX: Personal history of other diseases of the digestive system: Z87.19

## 2012-06-12 HISTORY — DX: Anxiety disorder, unspecified: F41.9

## 2012-06-12 HISTORY — DX: Myoneural disorder, unspecified: G70.9

## 2012-06-12 HISTORY — DX: Headache: R51

## 2012-06-12 HISTORY — DX: Shortness of breath: R06.02

## 2012-06-12 LAB — PROTIME-INR
INR: 0.99 (ref 0.00–1.49)
Prothrombin Time: 13 seconds (ref 11.6–15.2)

## 2012-06-12 LAB — KAPPA/LAMBDA LIGHT CHAINS
Kappa:Lambda Ratio: 0.3 (ref 0.26–1.65)
Lambda Free Lght Chn: 1.11 mg/dL (ref 0.57–2.63)

## 2012-06-12 LAB — COMPREHENSIVE METABOLIC PANEL
Albumin: 3.8 g/dL (ref 3.5–5.2)
BUN: 18 mg/dL (ref 6–23)
Creatinine, Ser: 1.01 mg/dL (ref 0.50–1.35)
Potassium: 4 mEq/L (ref 3.5–5.1)
Total Protein: 7.5 g/dL (ref 6.0–8.3)

## 2012-06-12 LAB — PROTEIN ELECTROPHORESIS, SERUM
Albumin ELP: 57.7 % (ref 55.8–66.1)
Alpha-1-Globulin: 3.3 % (ref 2.9–4.9)
Alpha-2-Globulin: 10.5 % (ref 7.1–11.8)
Beta Globulin: 6.4 % (ref 4.7–7.2)
Total Protein, Serum Electrophoresis: 7.6 g/dL (ref 6.0–8.3)

## 2012-06-12 LAB — CBC
HCT: 41 % (ref 39.0–52.0)
MCHC: 34.9 g/dL (ref 30.0–36.0)
RDW: 12.9 % (ref 11.5–15.5)

## 2012-06-12 LAB — URINALYSIS, ROUTINE W REFLEX MICROSCOPIC
Bilirubin Urine: NEGATIVE
Ketones, ur: NEGATIVE mg/dL
Nitrite: NEGATIVE
Protein, ur: NEGATIVE mg/dL
Urobilinogen, UA: 0.2 mg/dL (ref 0.0–1.0)

## 2012-06-12 LAB — TYPE AND SCREEN: Antibody Screen: NEGATIVE

## 2012-06-12 LAB — SURGICAL PCR SCREEN: MRSA, PCR: NEGATIVE

## 2012-06-12 LAB — APTT: aPTT: 25 seconds (ref 24–37)

## 2012-06-12 NOTE — Pre-Procedure Instructions (Signed)
Alfred Armstrong  06/12/2012   Your procedure is scheduled on:  06/18/2012  Report to [facility] at 5:30 AM.  Call this number if you have problems the morning of surgery: [phone]   Remember:   Do not eat food or drink liquids after midnight.  WEDNESDAY   Take these medicines the morning of surgery with A SIP OF WATER: symbicort, bystolic, lyrica,   Do not wear jewelry   Do not wear lotions, powders, or perfumes. You may wear deodorant.     Men may shave face and neck.   Do not bring valuables to the hospital.   Contacts, dentures or bridgework may not be worn into surgery.   Leave suitcase in the car. After surgery it may be brought to your room.    For patients admitted to the hospital, checkout time is 11:00 AM the day of  Discharge.     Patients discharged the day of surgery will not be allowed to drive  home.  Name and phone number of your driver: with sister   Special Instructions: Shower using CHG 2 nights before surgery and the night before surgery.  If you shower the day of surgery use CHG.  Use special wash - you have one bottle of CHG for all showers.  You should use approximately 1/3 of the bottle for each shower.   Please read over the following fact sheets that you were given: Pain Booklet, Coughing and Deep Breathing, Blood Transfusion Information, MRSA Information and Surgical Site Infection Prevention

## 2012-06-15 NOTE — Progress Notes (Signed)
Anesthesia chart review: Patient is a 79 year old male scheduled for right carotid endarterectomy by Dr. Edilia Bo on 06/18/12. He has 80-99% right ICA stenosis and known left CCA/ICA occlusion.  Other history includes former smoker, [place]G [place] ([place]), obesity, COPD, nephrolithiasis, GERD, hyperlipidemia, hypertension, CVA, hiatal hernia, anxiety, headaches, monoclonal gammopathy of unknown significance (MGUS) followed by Dr. Gaylyn Rong. PCP is Dr. Sanda Linger who is aware of upcoming surgery.   Cardiologist is Dr. [name], last visit on 11/20/11.  EKG then showed SB, ST/T wave abnormality, consider inferolateral ischemia.  At that time he reported working out on a treadmill or elliptical and weights for up to an hour 4 times a week at a gym. There was no exertional chest pain. He described a vague left upper quadrant/epigastric abdominal pain that was not related to exertion, and Dr. Shirlee Latch did not feel that it was cardiac related. No additional testing or changes were made in patient's medical therapy  Echo summary on 06/27/09 showed: Normal LV size and overall systolic function, EF 55%. Mild LV hypertrophy. There was basal inferoposterior severe hypokinesis to akinesis. Normal RV size and systolic function. No significant valvular dysfunction.  He had a myoview in 05/2008 suggesting ischemia, followed by a cardiac cath on done in 06/14/08 showing 90% pLAD, 90% mLAD, 60% mCFX, 70% pRCA, 70% mRCA, 95% dRCA, SVG-PLV patent, LIMA-LAD patent, no other grafts found by aortic root shot. Free radial-D presumed occluded.  CXR on 06/12/12 showed no acute disease in the chest.  Preoperative labs noted.  Patient has been evaluated by his cardiologist within the past year.  He reported good exercise tolerance without exertional chest pain symptoms at that time.  Dr. Shirlee Latch did not feel he was having any cardiac related symptoms and no new testing was ordered.  Patient's PCP is aware of planned surgery. I reviewed  above with anesthesiologist Dr. Krista Blue who agrees with clinical correlation on the day of surgery.  If patient has no new CV symptoms or other significant changes then would anticipate that he could proceed as planned.  Velna Ochs [facility]/Anesthesiology Phone [phone] 06/15/2012 6:18 PM

## 2012-06-17 ENCOUNTER — Telehealth: Payer: Self-pay | Admitting: Oncology

## 2012-06-17 ENCOUNTER — Ambulatory Visit (HOSPITAL_BASED_OUTPATIENT_CLINIC_OR_DEPARTMENT_OTHER): Payer: Medicare Other | Admitting: Oncology

## 2012-06-17 ENCOUNTER — Encounter: Payer: Self-pay | Admitting: Oncology

## 2012-06-17 VITALS — BP 130/73 | HR 59 | Temp 97.8°F | Resp 18 | Ht 72.0 in | Wt 227.5 lb

## 2012-06-17 DIAGNOSIS — R109 Unspecified abdominal pain: Secondary | ICD-10-CM

## 2012-06-17 DIAGNOSIS — J449 Chronic obstructive pulmonary disease, unspecified: Secondary | ICD-10-CM

## 2012-06-17 DIAGNOSIS — D472 Monoclonal gammopathy: Secondary | ICD-10-CM

## 2012-06-17 DIAGNOSIS — E785 Hyperlipidemia, unspecified: Secondary | ICD-10-CM

## 2012-06-17 MED ORDER — DEXTROSE 5 % IV SOLN
1.5000 g | INTRAVENOUS | Status: AC
  Administered 2012-06-18: 1.5 g via INTRAVENOUS
  Filled 2012-06-17: qty 1.5

## 2012-06-17 MED ORDER — SODIUM CHLORIDE 0.9 % IV SOLN: INTRAVENOUS | Status: DC

## 2012-06-17 NOTE — Telephone Encounter (Signed)
Gave pt appt for lab before and Md on May 2014

## 2012-06-17 NOTE — Progress Notes (Signed)
[facility]  Telephone:([phone]) [phone] Fax:([phone]) [phone]   OFFICE PROGRESS NOTE   Cc:  Sanda Linger, MD  DIAGNOSIS: Monoclonal gammopathy of undermined significance (MGUS).   CURRENT THERAPY: Watchful observation.  INTERVAL HISTORY: Alfred Armstrong 79 y.o. male returns for regular follow up.  He reports feeling relatively well.  He has no new complaint.  He is scheduled for a right carotid endarterectomy tomorrow He has chronic lower back pain for many years.  He also has chronic arthritic pain in the hands which he takes Advil for.  He denied lower extremity weakness, paresthesia, bowel/bladder incontinence.  He still has left upper quadrant discomfort.  The pain is fleeting, crampy, without radiation, without relation to foods or bowel movement.  He denied posterior flank pain or hematuria.    Patient denies fever, anorexia, weight loss, fatigue, headache, visual changes, confusion, drenching night sweats, palpable lymph node swelling, mucositis, odynophagia, dysphagia, nausea vomiting, jaundice, chest pain, palpitation, shortness of breath, dyspnea on exertion, productive cough, gum bleeding, epistaxis, hematemesis, hemoptysis, abdominal swelling, early satiety, melena, hematochezia, hematuria, skin rash, spontaneous bleeding, joint swelling, heat or cold intolerance, focal motor weakness, paresthesia, depression, suicidal or homicidal ideation, feeling hopelessness.   Past Medical History  Diagnosis Date  . COPD (chronic obstructive pulmonary disease)   . Osteoarthrosis, unspecified whether generalized or localized, unspecified site   . History of nephrolithiasis   . GERD (gastroesophageal reflux disease)   . Tubulovillous adenoma polyp of colon     w/HGD 1999  . Hyperlipidemia   . Carotid stenosis     Followed by Dr Madilyn Fireman - Angiogram 5/10 showed left common carotid to be totally occluded and 50% RICA stenosis  . HTN (hypertension)     ACEI Cough  . CVA (cerebral  infarction)   . Shingles   . MGUS (monoclonal gammopathy of unknown significance) 06/17/2011  . CAD (coronary artery disease)     Dr. Shirlee Latch  . Anxiety     related to hosp. enviromental   . Shortness of breath     uses symbicort daily, when taking the garbage   . H/O hiatal hernia   . Headache     annoyance related to enviromental allergies   . Neuromuscular disorder     neuropathy    Past Surgical History  Procedure Laterality Date  . Tonsillectomy  1941  . Coronary angioplasty  04/2008  . Carotid artery angiogram  04/2008  . Coronary artery bypass graft  1998    [facility]. in [place]     Current Outpatient Prescriptions  Medication Sig Dispense Refill  . acetaminophen (TYLENOL) 500 MG tablet Take 1,000 mg by mouth 2 (two) times daily as needed for pain.      . Ascorbic Acid (VITAMIN C) 1000 MG tablet Take 1,000 mg by mouth daily.        Marland Kitchen aspirin 81 MG EC tablet Take 81 mg by mouth daily.       . budesonide-formoterol (SYMBICORT) 160-4.5 MCG/ACT inhaler Inhale 2 puffs into the lungs 2 (two) times daily.  4 Inhaler  0  . Calcium Citrate-Vitamin D (CALCIUM CITRATE + PO) Take 2 tablets by mouth daily.        . cholecalciferol (VITAMIN D) 1000 UNITS tablet Take 1,000 Units by mouth 2 (two) times daily.      Marland Kitchen losartan (COZAAR) 25 MG tablet Take 12.5 mg by mouth daily.      . mometasone (NASONEX) 50 MCG/ACT nasal spray Place 2  sprays into the nose daily.      . nebivolol (BYSTOLIC) 10 MG tablet Take 10 mg by mouth daily before breakfast.       . pregabalin (LYRICA) 50 MG capsule Take 50 mg by mouth 3 (three) times daily as needed (for pain).      . rosuvastatin (CRESTOR) 20 MG tablet Take 20 mg by mouth daily.      . traMADol-acetaminophen (ULTRACET) 37.5-325 MG per tablet Take 1 tablet by mouth every evening.      . zolpidem (AMBIEN) 10 MG tablet Take 10 mg by mouth at bedtime as needed for sleep.      . [DISCONTINUED] Hypertonic Nasal Wash (SINUS RINSE BOTTLE KIT  NA) by Nasal route as directed.         No current facility-administered medications for this visit.    ALLERGIES:  is allergic to tiotropium bromide monohydrate and daliresp.  REVIEW OF SYSTEMS:  The rest of the 14-point review of system was negative.   Filed Vitals:   06/17/12 1315  BP: 130/73  Pulse: 59  Temp: 97.8 F (36.6 C)  Resp: 18   Wt Readings from Last 3 Encounters:  06/17/12 227 lb 8 oz (103.193 kg)  06/12/12 230 lb 2.6 oz (104.4 kg)  06/03/12 229 lb 3.2 oz (103.964 kg)   ECOG Performance status: 0  PHYSICAL EXAMINATION:   General:  well-nourished man, in no acute distress.  Eyes:  no scleral icterus.  ENT:  There were no oropharyngeal lesions.  Neck was without thyromegaly.  Lymphatics:  Negative cervical, supraclavicular or axillary adenopathy.  Respiratory: lungs were clear bilaterally without wheezing or crackles.  Cardiovascular:  Regular rate and rhythm, S1/S2, without murmur, rub or gallop.  There was no pedal edema.  GI:  abdomen was soft, flat, nontender, nondistended, without organomegaly. I could not feel any left upper abdominal mass.  He was not tender to palpation on exam today.  Muscoloskeletal:  no spinal tenderness of palpation of vertebral spine.  Skin exam was without echymosis, petichae.  Neuro exam was nonfocal.  Patient was able to get on and off exam table without assistance.  Gait was normal.  Patient was alerted and oriented.  Attention was good.   Language was appropriate.  Mood was normal without depression.  Speech was not pressured.  Thought content was not tangential.    LABORATORY/RADIOLOGY DATA:  Lab Results  Component Value Date   WBC 8.0 06/12/2012   HGB 14.3 06/12/2012   HCT 41.0 06/12/2012   PLT 192 06/12/2012   GLUCOSE 113* 06/12/2012   CHOL 134 10/29/2011   TRIG 82.0 10/29/2011   HDL 52.70 10/29/2011   LDLCALC 65 10/29/2011   ALKPHOS 47 06/12/2012   ALT 28 06/12/2012   AST 24 06/12/2012   NA 139 06/12/2012   K 4.0 06/12/2012   CL 106  06/12/2012   CREATININE 1.01 06/12/2012   BUN 18 06/12/2012   CO2 23 06/12/2012   PSA 3.05 12/14/2010   INR 0.99 06/12/2012   HGBA1C 6.2 03/25/2012    ASSESSMENT AND PLAN:   1.  MGUS:  Stable.  No active myeloma.  He has no anemia, renal problem or hypercalcemia.  His M-spike is stable (<1gm/dL which was his peak before).  I again recommended to continue watchful observation.  2.  Left flank pain:  Unclear etiology.  Plain film and CT scan in 2012 were negative. He had a chest x-ray performed earlier this week as part of his preop  workup in no obvious abnormality was noted. He will continue to follow his PCP regarding this. 3.  COPD:  On inhaler prn.  4.  HLP: on Crestor per PCP.  5.  History of CAD:  He is on ASA, Crestor, losartan, Nebivolol.  6.  Arthritis. I advised him to minimize the use of NSAIDS to avoid PUD and renal insufficiency.  8.  Follow up:  As his myeloma panel has been stable, he will return in one year.       The length of time of the face-to-face encounter was 15  minutes. More than 50% of time was spent counseling and coordination of care.

## 2012-06-18 ENCOUNTER — Telehealth: Payer: Self-pay | Admitting: Vascular Surgery

## 2012-06-18 ENCOUNTER — Encounter (HOSPITAL_COMMUNITY): Payer: Self-pay | Admitting: Anesthesiology

## 2012-06-18 ENCOUNTER — Encounter (HOSPITAL_COMMUNITY): Payer: Self-pay | Admitting: Vascular Surgery

## 2012-06-18 ENCOUNTER — Inpatient Hospital Stay (HOSPITAL_COMMUNITY): Payer: Medicare Other | Admitting: Anesthesiology

## 2012-06-18 ENCOUNTER — Encounter (HOSPITAL_COMMUNITY)
Admission: RE | Disposition: A | Discharge status: Home / Self Care | Payer: Self-pay | Source: Ambulatory Visit | Attending: Vascular Surgery

## 2012-06-18 ENCOUNTER — Inpatient Hospital Stay (HOSPITAL_COMMUNITY)
Admission: RE | Admit: 2012-06-18 | Discharge: 2012-06-19 | DRG: 039 | Disposition: A | Discharge status: Home / Self Care | Payer: Medicare Other | Source: Ambulatory Visit | Attending: Vascular Surgery | Admitting: Vascular Surgery

## 2012-06-18 DIAGNOSIS — I6529 Occlusion and stenosis of unspecified carotid artery: Principal | ICD-10-CM | POA: Diagnosis present

## 2012-06-18 DIAGNOSIS — J4489 Other specified chronic obstructive pulmonary disease: Secondary | ICD-10-CM | POA: Diagnosis present

## 2012-06-18 DIAGNOSIS — Z87442 Personal history of urinary calculi: Secondary | ICD-10-CM

## 2012-06-18 DIAGNOSIS — Z8673 Personal history of transient ischemic attack (TIA), and cerebral infarction without residual deficits: Secondary | ICD-10-CM

## 2012-06-18 DIAGNOSIS — Z860100 Personal history of colon polyps, unspecified: Secondary | ICD-10-CM

## 2012-06-18 DIAGNOSIS — K219 Gastro-esophageal reflux disease without esophagitis: Secondary | ICD-10-CM | POA: Diagnosis present

## 2012-06-18 DIAGNOSIS — D472 Monoclonal gammopathy: Secondary | ICD-10-CM | POA: Diagnosis present

## 2012-06-18 DIAGNOSIS — Z951 Presence of aortocoronary bypass graft: Secondary | ICD-10-CM

## 2012-06-18 DIAGNOSIS — I10 Essential (primary) hypertension: Secondary | ICD-10-CM | POA: Diagnosis present

## 2012-06-18 DIAGNOSIS — M159 Polyosteoarthritis, unspecified: Secondary | ICD-10-CM | POA: Diagnosis present

## 2012-06-18 DIAGNOSIS — J449 Chronic obstructive pulmonary disease, unspecified: Secondary | ICD-10-CM | POA: Diagnosis present

## 2012-06-18 DIAGNOSIS — I2510 Atherosclerotic heart disease of native coronary artery without angina pectoris: Secondary | ICD-10-CM | POA: Diagnosis present

## 2012-06-18 DIAGNOSIS — I658 Occlusion and stenosis of other precerebral arteries: Secondary | ICD-10-CM | POA: Diagnosis present

## 2012-06-18 DIAGNOSIS — E785 Hyperlipidemia, unspecified: Secondary | ICD-10-CM | POA: Diagnosis present

## 2012-06-18 HISTORY — PX: ENDARTERECTOMY: SHX5162

## 2012-06-18 HISTORY — PX: PATCH ANGIOPLASTY: SHX6230

## 2012-06-18 SURGERY — ENDARTERECTOMY, CAROTID
Anesthesia: General | Site: Neck | Laterality: Right | Wound class: Clean

## 2012-06-18 MED ORDER — LIDOCAINE HCL (PF) 1 % IJ SOLN
INTRAMUSCULAR | Status: DC | PRN
  Administered 2012-06-18: 30 mL

## 2012-06-18 MED ORDER — OXYCODONE HCL 5 MG PO TABS: 5.0000 mg | ORAL_TABLET | Freq: Four times a day (QID) | ORAL | Status: DC | PRN

## 2012-06-18 MED ORDER — SODIUM CHLORIDE 0.9 % IR SOLN
Status: DC | PRN
  Administered 2012-06-18: 08:00:00

## 2012-06-18 MED ORDER — HEPARIN SODIUM (PORCINE) 1000 UNIT/ML IJ SOLN
INTRAMUSCULAR | Status: DC | PRN
  Administered 2012-06-18: 9000 [IU] via INTRAVENOUS
  Administered 2012-06-18: 2000 [IU] via INTRAVENOUS

## 2012-06-18 MED ORDER — ATORVASTATIN CALCIUM 10 MG PO TABS
10.0000 mg | ORAL_TABLET | Freq: Every day | ORAL | Status: DC
  Administered 2012-06-18: 10 mg via ORAL
  Filled 2012-06-18 (×2): qty 1

## 2012-06-18 MED ORDER — TRAMADOL-ACETAMINOPHEN 37.5-325 MG PO TABS
1.0000 | ORAL_TABLET | Freq: Every evening | ORAL | Status: DC
  Administered 2012-06-18: 1 via ORAL
  Filled 2012-06-18 (×2): qty 1

## 2012-06-18 MED ORDER — HYDROMORPHONE HCL PF 1 MG/ML IJ SOLN
0.2500 mg | INTRAMUSCULAR | Status: DC | PRN
  Administered 2012-06-18: 0.5 mg via INTRAVENOUS
  Administered 2012-06-18 (×2): 0.25 mg via INTRAVENOUS

## 2012-06-18 MED ORDER — HYDRALAZINE HCL 20 MG/ML IJ SOLN: 10.0000 mg | INTRAMUSCULAR | Status: DC | PRN

## 2012-06-18 MED ORDER — DEXTRAN 40 IN SALINE 10-0.9 % IV SOLN
INTRAVENOUS | Status: AC
  Filled 2012-06-18: qty 500

## 2012-06-18 MED ORDER — DEXTRAN 40 IN SALINE 10-0.9 % IV SOLN
INTRAVENOUS | Status: DC | PRN
  Administered 2012-06-18: 500 mL

## 2012-06-18 MED ORDER — 0.9 % SODIUM CHLORIDE (POUR BTL) OPTIME
TOPICAL | Status: DC | PRN
  Administered 2012-06-18 (×2): 1000 mL

## 2012-06-18 MED ORDER — SODIUM CHLORIDE 0.9 % IV SOLN
10.0000 mg | INTRAVENOUS | Status: DC | PRN
  Administered 2012-06-18: 15 ug/min via INTRAVENOUS

## 2012-06-18 MED ORDER — FENTANYL CITRATE 0.05 MG/ML IJ SOLN
INTRAMUSCULAR | Status: DC | PRN
  Administered 2012-06-18: 25 ug via INTRAVENOUS
  Administered 2012-06-18 (×2): 50 ug via INTRAVENOUS

## 2012-06-18 MED ORDER — GUAIFENESIN-DM 100-10 MG/5ML PO SYRP: 15.0000 mL | ORAL_SOLUTION | ORAL | Status: DC | PRN

## 2012-06-18 MED ORDER — LIDOCAINE HCL (PF) 1 % IJ SOLN
INTRAMUSCULAR | Status: AC
  Filled 2012-06-18: qty 30

## 2012-06-18 MED ORDER — ZOLPIDEM TARTRATE 5 MG PO TABS
5.0000 mg | ORAL_TABLET | Freq: Every evening | ORAL | Status: DC | PRN
  Administered 2012-06-18: 5 mg via ORAL
  Filled 2012-06-18: qty 1

## 2012-06-18 MED ORDER — ONDANSETRON HCL 4 MG/2ML IJ SOLN
INTRAMUSCULAR | Status: DC | PRN
  Administered 2012-06-18: 4 mg via INTRAVENOUS

## 2012-06-18 MED ORDER — PROTAMINE SULFATE 10 MG/ML IV SOLN
INTRAVENOUS | Status: DC | PRN
  Administered 2012-06-18 (×4): 5 mg via INTRAVENOUS

## 2012-06-18 MED ORDER — THROMBIN 20000 UNITS EX SOLR
CUTANEOUS | Status: DC | PRN
  Administered 2012-06-18: 10:00:00 via TOPICAL

## 2012-06-18 MED ORDER — MEPERIDINE HCL 25 MG/ML IJ SOLN: 6.2500 mg | INTRAMUSCULAR | Status: DC | PRN

## 2012-06-18 MED ORDER — GLYCOPYRROLATE 0.2 MG/ML IJ SOLN
INTRAMUSCULAR | Status: DC | PRN
  Administered 2012-06-18: .6 mg via INTRAVENOUS

## 2012-06-18 MED ORDER — PHENYLEPHRINE HCL 10 MG/ML IJ SOLN: INTRAMUSCULAR | Status: DC | PRN

## 2012-06-18 MED ORDER — PROPOFOL 10 MG/ML IV BOLUS
INTRAVENOUS | Status: DC | PRN
  Administered 2012-06-18: 150 mg via INTRAVENOUS

## 2012-06-18 MED ORDER — VITAMIN C 500 MG PO TABS
1000.0000 mg | ORAL_TABLET | Freq: Every day | ORAL | Status: DC
  Administered 2012-06-18 – 2012-06-19 (×2): 1000 mg via ORAL
  Filled 2012-06-18 (×2): qty 2

## 2012-06-18 MED ORDER — FLUTICASONE PROPIONATE 50 MCG/ACT NA SUSP
1.0000 | Freq: Every day | NASAL | Status: DC
  Administered 2012-06-18 – 2012-06-19 (×2): 1 via NASAL
  Filled 2012-06-18: qty 16

## 2012-06-18 MED ORDER — ASPIRIN 81 MG PO CHEW
81.0000 mg | CHEWABLE_TABLET | Freq: Every day | ORAL | Status: DC
  Administered 2012-06-19: 81 mg via ORAL
  Filled 2012-06-18: qty 1

## 2012-06-18 MED ORDER — OXYCODONE HCL 5 MG PO TABS: 5.0000 mg | ORAL_TABLET | Freq: Once | ORAL | Status: DC | PRN

## 2012-06-18 MED ORDER — BUDESONIDE-FORMOTEROL FUMARATE 160-4.5 MCG/ACT IN AERO
2.0000 | INHALATION_SPRAY | Freq: Two times a day (BID) | RESPIRATORY_TRACT | Status: DC
  Administered 2012-06-18 – 2012-06-19 (×2): 2 via RESPIRATORY_TRACT
  Filled 2012-06-18: qty 6

## 2012-06-18 MED ORDER — POTASSIUM CHLORIDE CRYS ER 20 MEQ PO TBCR: 20.0000 meq | EXTENDED_RELEASE_TABLET | Freq: Once | ORAL | Status: AC | PRN

## 2012-06-18 MED ORDER — DOCUSATE SODIUM 100 MG PO CAPS
100.0000 mg | ORAL_CAPSULE | Freq: Every day | ORAL | Status: DC
  Administered 2012-06-19: 100 mg via ORAL

## 2012-06-18 MED ORDER — OXYCODONE HCL 5 MG PO TABS
5.0000 mg | ORAL_TABLET | ORAL | Status: DC | PRN
  Administered 2012-06-18: 5 mg via ORAL
  Administered 2012-06-19: 10 mg via ORAL
  Administered 2012-06-19: 5 mg via ORAL
  Administered 2012-06-19: 10 mg via ORAL
  Filled 2012-06-18: qty 1
  Filled 2012-06-18: qty 2
  Filled 2012-06-18: qty 1
  Filled 2012-06-18: qty 2

## 2012-06-18 MED ORDER — ONDANSETRON HCL 4 MG/2ML IJ SOLN
4.0000 mg | Freq: Four times a day (QID) | INTRAMUSCULAR | Status: DC | PRN
  Administered 2012-06-18: 4 mg via INTRAVENOUS
  Filled 2012-06-18: qty 2

## 2012-06-18 MED ORDER — METOPROLOL TARTRATE 1 MG/ML IV SOLN: 2.0000 mg | INTRAVENOUS | Status: DC | PRN

## 2012-06-18 MED ORDER — PHENOL 1.4 % MT LIQD: 1.0000 | OROMUCOSAL | Status: DC | PRN

## 2012-06-18 MED ORDER — DEXTROSE 5 % IV SOLN
1.5000 g | Freq: Two times a day (BID) | INTRAVENOUS | Status: AC
  Administered 2012-06-18 – 2012-06-19 (×2): 1.5 g via INTRAVENOUS
  Filled 2012-06-18 (×2): qty 1.5

## 2012-06-18 MED ORDER — ARTIFICIAL TEARS OP OINT
TOPICAL_OINTMENT | OPHTHALMIC | Status: DC | PRN
  Administered 2012-06-18: 1 via OPHTHALMIC

## 2012-06-18 MED ORDER — PANTOPRAZOLE SODIUM 40 MG PO TBEC
40.0000 mg | DELAYED_RELEASE_TABLET | Freq: Every day | ORAL | Status: DC
  Administered 2012-06-18 – 2012-06-19 (×2): 40 mg via ORAL
  Filled 2012-06-18 (×2): qty 1

## 2012-06-18 MED ORDER — DOPAMINE-DEXTROSE 3.2-5 MG/ML-% IV SOLN
3.0000 ug/kg/min | INTRAVENOUS | Status: DC
  Administered 2012-06-18: 3 ug/kg/min via INTRAVENOUS
  Filled 2012-06-18: qty 250

## 2012-06-18 MED ORDER — LIDOCAINE-EPINEPHRINE (PF) 1 %-1:200000 IJ SOLN
INTRAMUSCULAR | Status: AC
  Filled 2012-06-18: qty 10

## 2012-06-18 MED ORDER — LACTATED RINGERS IV SOLN
INTRAVENOUS | Status: DC | PRN
  Administered 2012-06-18 (×2): via INTRAVENOUS

## 2012-06-18 MED ORDER — VITAMIN D3 25 MCG (1000 UNIT) PO TABS
1000.0000 [IU] | ORAL_TABLET | Freq: Two times a day (BID) | ORAL | Status: DC
  Administered 2012-06-18 – 2012-06-19 (×3): 1000 [IU] via ORAL
  Filled 2012-06-18 (×4): qty 1

## 2012-06-18 MED ORDER — LIDOCAINE-EPINEPHRINE (PF) 1 %-1:200000 IJ SOLN
INTRAMUSCULAR | Status: DC | PRN
  Administered 2012-06-18: 30 mL

## 2012-06-18 MED ORDER — GLYCOPYRROLATE 0.2 MG/ML IJ SOLN
INTRAMUSCULAR | Status: AC
  Administered 2012-06-18: 0.1 mg
  Filled 2012-06-18: qty 1

## 2012-06-18 MED ORDER — THROMBIN 20000 UNITS EX SOLR
CUTANEOUS | Status: AC
  Filled 2012-06-18: qty 20000

## 2012-06-18 MED ORDER — ROCURONIUM BROMIDE 100 MG/10ML IV SOLN
INTRAVENOUS | Status: DC | PRN
  Administered 2012-06-18: 50 mg via INTRAVENOUS

## 2012-06-18 MED ORDER — HYDROMORPHONE HCL PF 1 MG/ML IJ SOLN
INTRAMUSCULAR | Status: AC
  Filled 2012-06-18: qty 1

## 2012-06-18 MED ORDER — PROMETHAZINE HCL 25 MG/ML IJ SOLN: 6.2500 mg | INTRAMUSCULAR | Status: DC | PRN

## 2012-06-18 MED ORDER — LABETALOL HCL 5 MG/ML IV SOLN: 10.0000 mg | INTRAVENOUS | Status: DC | PRN

## 2012-06-18 MED ORDER — OXYCODONE HCL 5 MG/5ML PO SOLN: 5.0000 mg | Freq: Once | ORAL | Status: DC | PRN

## 2012-06-18 MED ORDER — SODIUM CHLORIDE 0.9 % IV SOLN
INTRAVENOUS | Status: DC
  Administered 2012-06-18 (×2): via INTRAVENOUS

## 2012-06-18 MED ORDER — ALUM & MAG HYDROXIDE-SIMETH 200-200-20 MG/5ML PO SUSP
15.0000 mL | ORAL | Status: DC | PRN
  Administered 2012-06-18 – 2012-06-19 (×2): 15 mL via ORAL
  Filled 2012-06-18 (×2): qty 30

## 2012-06-18 MED ORDER — ACETAMINOPHEN 500 MG PO TABS
1000.0000 mg | ORAL_TABLET | Freq: Two times a day (BID) | ORAL | Status: DC | PRN
  Filled 2012-06-18 (×2): qty 2

## 2012-06-18 MED ORDER — NEOSTIGMINE METHYLSULFATE 1 MG/ML IJ SOLN
INTRAMUSCULAR | Status: DC | PRN
  Administered 2012-06-18: 3 mg via INTRAVENOUS

## 2012-06-18 MED ORDER — PREGABALIN 50 MG PO CAPS: 50.0000 mg | ORAL_CAPSULE | Freq: Three times a day (TID) | ORAL | Status: DC | PRN

## 2012-06-18 MED ORDER — LOSARTAN POTASSIUM 25 MG PO TABS
12.5000 mg | ORAL_TABLET | Freq: Every day | ORAL | Status: DC
  Administered 2012-06-19: 12.5 mg via ORAL
  Filled 2012-06-18 (×2): qty 0.5

## 2012-06-18 MED ORDER — NEBIVOLOL HCL 10 MG PO TABS
10.0000 mg | ORAL_TABLET | Freq: Every day | ORAL | Status: DC
Start: 2012-06-19 — End: 2012-06-19
  Filled 2012-06-18 (×2): qty 1

## 2012-06-18 MED ORDER — SODIUM CHLORIDE 0.9 % IV SOLN
500.0000 mL | Freq: Once | INTRAVENOUS | Status: AC | PRN
  Administered 2012-06-18: 500 mL via INTRAVENOUS

## 2012-06-18 MED ORDER — MORPHINE SULFATE 2 MG/ML IJ SOLN: 2.0000 mg | INTRAMUSCULAR | Status: DC | PRN

## 2012-06-18 MED ORDER — GLYCOPYRROLATE 0.2 MG/ML IJ SOLN
0.2000 mg | Freq: Once | INTRAMUSCULAR | Status: AC
  Administered 2012-06-18: 0.1 mg via INTRAVENOUS

## 2012-06-18 MED ORDER — LIDOCAINE HCL (CARDIAC) 20 MG/ML IV SOLN
INTRAVENOUS | Status: DC | PRN
  Administered 2012-06-18: 50 mg via INTRAVENOUS

## 2012-06-18 SURGICAL SUPPLY — 59 items
ADH SKN CLS APL DERMABOND .7 (GAUZE/BANDAGES/DRESSINGS) ×2
BAG DECANTER FOR FLEXI CONT (MISCELLANEOUS) ×3 IMPLANT
CANISTER SUCTION 2500CC (MISCELLANEOUS) ×3 IMPLANT
CANNULA VESSEL W/WING WO/VALVE (CANNULA) ×3 IMPLANT
CATH ROBINSON RED A/P 18FR (CATHETERS) ×3 IMPLANT
CLIP TI MEDIUM 24 (CLIP) ×3 IMPLANT
CLIP TI WIDE RED SMALL 24 (CLIP) ×3 IMPLANT
CLOTH BEACON ORANGE TIMEOUT ST (SAFETY) ×3 IMPLANT
COVER SURGICAL LIGHT HANDLE (MISCELLANEOUS) ×3 IMPLANT
CRADLE DONUT ADULT HEAD (MISCELLANEOUS) ×3 IMPLANT
DERMABOND ADVANCED (GAUZE/BANDAGES/DRESSINGS) ×1
DERMABOND ADVANCED .7 DNX12 (GAUZE/BANDAGES/DRESSINGS) ×2 IMPLANT
DRAIN CHANNEL 15F RND FF W/TCR (WOUND CARE) IMPLANT
DRAPE PROXIMA HALF (DRAPES) ×1 IMPLANT
DRAPE WARM FLUID 44X44 (DRAPE) ×3 IMPLANT
ELECT REM PT RETURN 9FT ADLT (ELECTROSURGICAL) ×3
ELECTRODE REM PT RTRN 9FT ADLT (ELECTROSURGICAL) ×2 IMPLANT
EVACUATOR SILICONE 100CC (DRAIN) IMPLANT
GLOVE BIO SURGEON STRL SZ 6.5 (GLOVE) ×2 IMPLANT
GLOVE BIO SURGEON STRL SZ7.5 (GLOVE) ×3 IMPLANT
GLOVE BIOGEL PI IND STRL 6.5 (GLOVE) IMPLANT
GLOVE BIOGEL PI IND STRL 7.0 (GLOVE) IMPLANT
GLOVE BIOGEL PI IND STRL 7.5 (GLOVE) IMPLANT
GLOVE BIOGEL PI IND STRL 8 (GLOVE) ×2 IMPLANT
GLOVE BIOGEL PI INDICATOR 6.5 (GLOVE) ×1
GLOVE BIOGEL PI INDICATOR 7.0 (GLOVE) ×3
GLOVE BIOGEL PI INDICATOR 7.5 (GLOVE) ×4
GLOVE BIOGEL PI INDICATOR 8 (GLOVE) ×1
GLOVE ECLIPSE 6.5 STRL STRAW (GLOVE) ×2 IMPLANT
GLOVE SURG SS PI 7.5 STRL IVOR (GLOVE) ×3 IMPLANT
GOWN EXTRA PROTECTION XXL 0583 (GOWNS) ×1 IMPLANT
GOWN STRL NON-REIN LRG LVL3 (GOWN DISPOSABLE) ×9 IMPLANT
KIT BASIN OR (CUSTOM PROCEDURE TRAY) ×3 IMPLANT
KIT ROOM TURNOVER OR (KITS) ×3 IMPLANT
NDL HYPO 25X1 1.5 SAFETY (NEEDLE) ×2 IMPLANT
NEEDLE HYPO 25X1 1.5 SAFETY (NEEDLE) ×3 IMPLANT
NS IRRIG 1000ML POUR BTL (IV SOLUTION) ×6 IMPLANT
PACK CAROTID (CUSTOM PROCEDURE TRAY) ×3 IMPLANT
PAD ARMBOARD 7.5X6 YLW CONV (MISCELLANEOUS) ×6 IMPLANT
PATCH HEMASHIELD 8X75 (Vascular Products) ×1 IMPLANT
SHUNT CAROTID BYPASS 10 (VASCULAR PRODUCTS) ×1 IMPLANT
SHUNT CAROTID BYPASS 12 (VASCULAR PRODUCTS) ×1 IMPLANT
SHUNT CAROTID BYPASS 12FRX15.5 (VASCULAR PRODUCTS) IMPLANT
SPECIMEN JAR SMALL (MISCELLANEOUS) ×3 IMPLANT
SPONGE SURGIFOAM ABS GEL 100 (HEMOSTASIS) ×1 IMPLANT
SUT PROLENE 5 0 C 1 24 (SUTURE) ×1 IMPLANT
SUT PROLENE 6 0 BV (SUTURE) ×8 IMPLANT
SUT PROLENE 6 0 C 1 24 (SUTURE) ×2 IMPLANT
SUT PROLENE 7 0 BV 1 (SUTURE) IMPLANT
SUT SILK 2 0 FS (SUTURE) IMPLANT
SUT SILK 3 0 TIES 17X18 (SUTURE)
SUT SILK 3-0 18XBRD TIE BLK (SUTURE) IMPLANT
SUT VIC AB 3-0 SH 27 (SUTURE) ×3
SUT VIC AB 3-0 SH 27X BRD (SUTURE) ×2 IMPLANT
SUT VICRYL 4-0 PS2 18IN ABS (SUTURE) ×3 IMPLANT
SYR CONTROL 10ML LL (SYRINGE) ×3 IMPLANT
TOWEL OR 17X24 6PK STRL BLUE (TOWEL DISPOSABLE) ×3 IMPLANT
TOWEL OR 17X26 10 PK STRL BLUE (TOWEL DISPOSABLE) ×3 IMPLANT
WATER STERILE IRR 1000ML POUR (IV SOLUTION) ×3 IMPLANT

## 2012-06-18 NOTE — Progress Notes (Signed)
Dr. Jacklynn Bue called informed pt receiving second fluid bolus of 500 cc normal saline b/p arterial 109/56 cuff 71/30 Dr. Jacklynn Bue at bedside

## 2012-06-18 NOTE — Transfer of Care (Signed)
Immediate Anesthesia Transfer of Care Note  Patient: Alfred Armstrong  Procedure(s) Performed: Procedure(s) with comments: ENDARTERECTOMY CAROTID (Right) - with resection of redundant Right Internal Carotid Artery PATCH ANGIOPLASTY (Right) - using Hemashield Platinum Finesse Patch  Patient Location: PACU  Anesthesia Type:General  Level of Consciousness: awake and alert   Airway & Oxygen Therapy: Patient Spontanous Breathing and Patient connected to nasal cannula oxygen  Post-op Assessment: Report given to PACU RN, Post -op Vital signs reviewed and stable, Patient moving all extremities, Patient moving all extremities X 4 and Patient able to stick tongue midline  Post vital signs: Reviewed and stable  Complications: No apparent anesthesia complications

## 2012-06-18 NOTE — Anesthesia Preprocedure Evaluation (Addendum)
Anesthesia Evaluation  Patient identified by MRN, date of birth, ID band Patient awake    Reviewed: Allergy & Precautions, H&P , NPO status , Patient's Chart, lab work & pertinent test results  History of Anesthesia Complications Negative for: history of anesthetic complications  Airway Mallampati: II TM Distance: >3 FB Neck ROM: full    Dental  (+) Upper Dentures, Dental Advidsory Given and Partial Upper   Pulmonary shortness of breath, COPD breath sounds clear to auscultation        Cardiovascular hypertension, + CAD and + Peripheral Vascular Disease Rhythm:Regular Rate:Normal     Neuro/Psych  Headaches,  Neuromuscular disease    GI/Hepatic hiatal hernia, GERD-  ,  Endo/Other    Renal/GU Renal disease     Musculoskeletal   Abdominal   Peds  Hematology   Anesthesia Other Findings   Reproductive/Obstetrics                         Anesthesia Physical Anesthesia Plan  ASA: III  Anesthesia Plan: General   Post-op Pain Management:    Induction:   Airway Management Planned: Oral ETT  Additional Equipment: Arterial line  Intra-op Plan:   Post-operative Plan: Extubation in OR  Informed Consent: I have reviewed the patients History and Physical, chart, labs and discussed the procedure including the risks, benefits and alternatives for the proposed anesthesia with the patient or authorized representative who has indicated his/her understanding and acceptance.   Dental Advisory Given  Plan Discussed with: CRNA and Surgeon  Anesthesia Plan Comments:       Anesthesia Quick Evaluation

## 2012-06-18 NOTE — Telephone Encounter (Signed)
Judy aware appt longer than 2 wks, lvm for pt, told him if any problems before appt, to call us, sent letter - kf

## 2012-06-18 NOTE — Anesthesia Procedure Notes (Signed)
Procedure Name: Intubation Date/Time: 06/18/2012 7:35 AM Performed by: Carmela Rima Pre-anesthesia Checklist: Patient identified, Timeout performed, Emergency Drugs available, Suction available and Patient being monitored Patient Re-evaluated:Patient Re-evaluated prior to inductionOxygen Delivery Method: Circle system utilized Preoxygenation: Pre-oxygenation with 100% oxygen Intubation Type: IV induction Ventilation: Mask ventilation without difficulty Laryngoscope Size: Mac and 3 Tube type: Oral Tube size: 7.5 mm Number of attempts: 1 Placement Confirmation: ETT inserted through vocal cords under direct vision,  positive ETCO2 and breath sounds checked- equal and bilateral Secured at: 23 cm Tube secured with: Tape Dental Injury: Teeth and Oropharynx as per pre-operative assessment

## 2012-06-18 NOTE — Progress Notes (Signed)
BP with cuff is currently 97/40; received verbal order to give a 500 cc bolus, making 3 total including the two given in PACU.  Will continue to monitor.  Vivi Martens RN

## 2012-06-18 NOTE — Interval H&P Note (Signed)
History and Physical Interval Note:  06/18/2012 7:15 AM  Alfred Armstrong  has presented today for surgery, with the diagnosis of RIGHT ICA STENOSIS  The various methods of treatment have been discussed with the patient and family. After consideration of risks, benefits and other options for treatment, the patient has consented to  Procedure(s): ENDARTERECTOMY CAROTID (Right) as a surgical intervention .  The patient's history has been reviewed, patient examined, no change in status, stable for surgery.  I have reviewed the patient's chart and labs.  Questions were answered to the patient's satisfaction.     Annelise Mccoy S

## 2012-06-18 NOTE — Progress Notes (Signed)
#   1 500 cc fluid bolus infusing for SBP in low 80's, per Dr Adele Dan post op orders. Pt states he took his BP meds this am. Will cont to monitor closely.

## 2012-06-18 NOTE — Progress Notes (Signed)
VASCULAR PROGRESS NOTE  SUBJECTIVE: Comfortable  PHYSICAL EXAM: Filed Vitals:   06/18/12 1245 06/18/12 1300 06/18/12 1315 06/18/12 1330  BP: 86/55 88/35  101/39  Pulse: 47 51 51 53  Temp:      TempSrc:      Resp: 14 20 13 14   SpO2: 95% 99% 98% 98%   Neuro intact.   LABS: Lab Results  Component Value Date   WBC 8.0 06/12/2012   HGB 14.3 06/12/2012   HCT 41.0 06/12/2012   MCV 88.0 06/12/2012   PLT 192 06/12/2012   Lab Results  Component Value Date   CREATININE 1.01 06/12/2012   Lab Results  Component Value Date   INR 0.99 06/12/2012   ASSESSMENT AND PLAN: Stable post op.   Alfred Armstrong Beeper: [phone] 06/18/2012

## 2012-06-18 NOTE — Progress Notes (Signed)
Dr. Edilia Bo visited patient and family discussed surgery

## 2012-06-18 NOTE — Progress Notes (Signed)
Report to Kathy Nevill RN as primary caregiver 

## 2012-06-18 NOTE — Progress Notes (Addendum)
2nd 500 cc bolus since admitted to 3304 being administered now; bp 95/44; will make a total of 2 Liters including bolus' gvien in pacu; will continue to monitor.  Vivi Martens RN

## 2012-06-18 NOTE — Progress Notes (Signed)
Pt. brady'd to 39 and was formerly staying in the 50's and 60's; bp 90/49; Dopamine gtt started at 3 mcg/min/kg; verified with Seward Grater, RN.  Zofran given for Nausea from Dopamine.  MD paged to update; will continue to monitor.  Vivi Martens RN

## 2012-06-18 NOTE — Anesthesia Postprocedure Evaluation (Signed)
  Anesthesia Post-op Note  Patient: Alfred Armstrong  Procedure(s) Performed: Procedure(s) with comments: ENDARTERECTOMY CAROTID (Right) - with resection of redundant Right Internal Carotid Artery PATCH ANGIOPLASTY (Right) - using Hemashield Platinum Finesse Patch  Patient Location: PACU  Anesthesia Type:General  Level of Consciousness: awake and alert   Airway and Oxygen Therapy: Patient Spontanous Breathing  Post-op Pain: mild  Post-op Assessment: Post-op Vital signs reviewed, Patient's Cardiovascular Status Stable and Respiratory Function Stable  Post-op Vital Signs: stable  Complications: No apparent anesthesia complications

## 2012-06-18 NOTE — Preoperative (Signed)
Beta Blockers   Reason not to administer Beta Blockers:Not Applicable 

## 2012-06-18 NOTE — Op Note (Signed)
NAME: Alfred Armstrong    MRN: [id number] DOB: 03-22-1931    DATE OF OPERATION: 06/18/2012  PREOP DIAGNOSIS: greater than 80% right carotid stenosis.  POSTOP DIAGNOSIS: same  PROCEDURE:  1. Right carotid endarterectomy with Dacron patch angioplasty 2. Resection of redundant right internal carotid artery  SURGEON: Di Kindle. Edilia Bo, MD, FACS  ASSIST: Doreatha Massed, PA  ANESTHESIA: Gen.   EBL: 100 cc  INDICATIONS: Alfred Armstrong is a 79 y.o. male with a remote history of a stroke. He was found to have a left internal carotid artery occlusion and had a moderate right carotid stenosis which we had been following. His most recent follow up visit in May, the stenosis on the right had progressed to greater than 80%. Right carotid endarterectomy was recommended in order to lower his risk of future stroke. He had not had any recent neurologic symptoms.  FINDINGS: The patient had a very redundant internal carotid artery and required resection of the redundant segment of internal carotid artery. The plaque extended quite high.  TECHNIQUE: Patient was taken to the operating room and received a general anesthetic. An arterial line had been placed by anesthesia. The right neck was prepped and draped in usual sterile fashion. An incision was made along the anterior border of the sternocleidomastoid and dissection carried down to the common carotid artery which was dissected free and controlled with Rummel tourniquet. The bifurcation was somewhat high. I was able to control the internal carotid artery above the plaque. Approximately a centimeter above the bifurcation the artery became fairly tortuous and the plaque extended up into this tortuous segment. I controlled the internal carotid artery very high in the neck. The superior thyroid artery and external carotid artery were also controlled the patient was heparinized. Clamps were then placed on the internal then the external then the common carotid  artery. A longitudinal arteriotomy was made in the common carotid artery and this was extended through the plaque up to the internal carotid artery above the plaque. I placed a 12 shunt into the internal carotid artery and back bled the artery. There was good backbleeding. The shunt was then positioned into the common carotid artery and secured with a Rummel tourniquet. An endarterectomy plane was established proximally and the plaque was sharply divided. Eversion endarterectomy was performed of the external carotid artery. The endarterectomy extended very high as the plaque extended well into the internal carotid artery. Once the plaque was fully excised it was clear that the internal carotid artery was quite redundant and simply applying a patch would result in kinking of the artery. At this point it did not appear that there was flow to the shunt and therefore I checked with the Doppler. I was unable to get a Doppler signal in the shunt and the shunt was not moving up and down easily I therefore elected to place a smaller shunt. The 12 shunt was removed. A 10 shunt was placed into the internal carotid artery there was good backbleeding still. It was then placed into the common carotid artery and secured with Rummel tourniquet. Flows was reestablished through the shunt. The artery was irrigated with copious amounts of heparin and dextran all loose debris removed. Tacking sutures were then placed. The more proximal sutures were fairly high on the internal carotid artery. I resected approximately 2-1/2 cm of redundant internal carotid artery. The posterior wall was then sewn with running 6-0 Prolene suture. Dacron patch was then sewn with continuous 6-0 Prolene suture. After completing  the lateral wall the wound was irrigated and all loose debris removed. The patch closure was then completed. Prior to completing the closure the artery was backbled and flushed appropriately and the anastomosis then completed. Flow  was reestablished to the external carotid artery and then into the internal carotid artery. There was some bleeding from the suture line where the redundant segment had been excised. This was repaired with 2 6-0 Prolene sutures. There was good hemostasis. There was a good Doppler signal in the internal carotid artery with good diastolic flow and also in the external carotid artery and common carotid artery.  The heparin was partially reversed with 30 mg of protamine. The wound was then closed with deep layer 3-0 Vicryl, the platysma was closed with running 3-0 Vicryl. The skin was closed with a 4-0 subcuticular stitch. Dermabond was applied. The patient awoke neurologically intact. All needle and sponge counts were correct. Transferred to the recovery room in stable condition.  Waverly Ferrari, MD, FACS [facility]  DATE OF DICTATION:   06/18/2012

## 2012-06-18 NOTE — H&P (View-Only) (Signed)
[facility] [place]  Established Carotid Patient  History of Present Illness  Alfred Armstrong is a 79 y.o. (12/03/1931) male who presents with chief complaint: history of stroke and follow up carotid 6 month appointment. .  Previous carotid studies demonstrated: RICA 60-79% stenosis, LICA 100% stenosis.  Patient has had history of TIA or stroke symptom.  The patient has not had amaurosis fugax or monocular blindness.  The patient has not had facial drooping or hemiplegia.  The patient has not had receptive or expressive aphasia.    The patient's previous neurologic deficits expressive aphasia have resolved.  He does have horse ness to his voice. Past Medical History  Diagnosis Date  . CAD (coronary artery disease)   . COPD (chronic obstructive pulmonary disease)   . Osteoarthrosis, unspecified whether generalized or localized, unspecified site   . History of nephrolithiasis   . GERD (gastroesophageal reflux disease)   . Tubulovillous adenoma polyp of colon     w/HGD 1999  . Hyperlipidemia   . Carotid stenosis     Followed by Dr Madilyn Fireman - Angiogram 5/10 showed left common carotid to be totally occluded and 50% RICA stenosis  . HTN (hypertension)     ACEI Cough  . CVA (cerebral infarction)   . Shingles   . MGUS (monoclonal gammopathy of unknown significance) 06/17/2011   Past Surgical History  Procedure Laterality Date  . Coronary artery bypass graft  1989  . Tonsillectomy  1941  . Coronary angioplasty  04/2008  . Carotid artery angiogram  04/2008   For VQI Use Only   PRE-ADM LIVING: Home  AMB STATUS: Ambulatory  CAD Sx: None  PRIOR CHF: None  STRESS TEST: Arly.Keller ] No, [ ]  Normal, [ ]  + ischemia, [ ]  + MI, [ ]  Both  Statin use:  Yes ASA use:  Yes Beta blocker use:  Yes ACE-Inhibitor use:  no P2Y12 Antagonist use: [x ] None, Anti-coagulant use:  [ x] None,   Physical Examination  Filed Vitals:   06/03/12 1346 06/03/12 1349  BP: 139/65 143/65   Pulse: 50   Height: 6' (1.829 m)   Weight: 229 lb 3.2 oz (103.964 kg)   SpO2: 99%    Body mass index is 31.08 kg/(m^2).  General: A&O x 3, WDWN  Eyes: PERRLA, EOMI  Pulmonary: Sym exp, good air movt, CTAB, no rales, rhonchi, & wheezing  Cardiac: RRR, Nl S1, S2, no Murmurs, rubs or gallops  Vascular: Vessel Right Left  Radial Palpable Palpable  Ulnar Palpable Palpable  Brachial Palpable Palpable  Carotid Palpable, with bruit Palpable, without bruit  Aorta Not palpable N/A  Femoral Palpable Palpable  Popliteal Not palpable Not palpable  PT  Palpable  Palpable  DP  Palpable  Palpable   Gastrointestinal: soft, NTND, -G/R, - HSM, - masses  Musculoskeletal: M/S 5/5 throughout, Extremities without ischemic changes   Neurologic: CN 2-12 intact, Pain and light touch intact in extremities, Motor exam as listed above  Non-Invasive Vascular Imaging  CAROTID DUPLEX (Date: 06-03-2012):   R ICA stenosis: 80-99%  L ICA stenosis: 100%  Modified Rankin score at D/C (0-6): Rankin Score=0  scharge medications: Statin use:  Yes ASA use:  Yes Beta blocker use:  Yes ACE-Inhibitor use:  no P2Y12 Antagonist use: [x ] None, [ ]  Plavix, [ ]  Plasugrel, [ ]  Ticlopinine, [ ]  Ticagrelor, [ ]  Other, [ ]  No for medical reason, [ ]  Non-compliant, [ ]  Not-indicated Anti-coagulant use:  [ x] None, [ ]   Warfarin, [ ]  Rivaroxaban, [ ]  Dabigatran, [ ]  Other, [ ]  No for medical reason, [ ]  Non-compliant, [ ]  Not-indicated   Medical Decision Making  Alfred Armstrong is a 79 y.o. male who presents with: right ICA stenosis.   Based on the patient's vascular studies and examination, I have offered the patient: carotid endarterectomy.  I discussed in depth with the patient the nature of atherosclerosis, and emphasized the importance of maximal medical management including strict control of blood pressure, blood glucose, and lipid levels, antiplatelet agents, obtaining regular exercise, and cessation of  smoking.  The patient is aware that without maximal medical management the underlying atherosclerotic disease process will progress, limiting the benefit of any interventions.  Thank you for allowing [place] to participate in this patient's care.  Surgery plans questions and concerns were discussed with dr. Edilia Bo today.  Thomasena Edis [facility] PA-C [facility] [place] Office: [phone]   06/03/2012, 2:40 PM  Agree with above.  Given the progression of the right carotid stenosis to greater than 80% I have recommended right carotid endarterectomy in order to lower his risk of future stroke. I have reviewed the indications for carotid endarterectomy, that is to lower the risk of future stroke. I have also reviewed the potential complications of surgery, including but not limited to: bleeding, stroke (perioperative risk 1-2%), MI, nerve injury of other unpredictable medical problems. All of the patients questions were answered and they are agreeable to proceed with surgery. He will call to schedule his surgery. He knows to continue his aspirin right up through surgery.  Waverly Ferrari, MD, FACS Beeper [phone] 06/03/2012

## 2012-06-18 NOTE — Progress Notes (Signed)
Pt. Admitted to room 3304; vss bp 100/39; call light within reach; family at bedside; pt. Oriented to room.  Will continue to monitor.  Vivi Martens RN

## 2012-06-18 NOTE — Progress Notes (Signed)
Utilization review completed.  

## 2012-06-18 NOTE — Telephone Encounter (Signed)
Message copied by Margaretmary Eddy on Thu Jun 18, 2012  1:04 PM ------      Message from: Melene Plan      Created: Thu Jun 18, 2012 11:22 AM      Regarding: FW: charge and f/u                   ----- Message -----         From: Chuck Hint, MD         Sent: 06/18/2012  10:57 AM           To: Reuel Derby, Melene Plan, RN, #      Subject: charge and f/u                                           PROCEDURE:       1. Right carotid endarterectomy with Dacron patch angioplasty      2. Resection of redundant right internal carotid artery            SURGEON: Di Kindle. Edilia Bo, MD, FACS            ASSIST: Doreatha Massed, PA            Needs a f/u visit in 2 weeks.       CD ------

## 2012-06-19 ENCOUNTER — Encounter (HOSPITAL_COMMUNITY): Payer: Self-pay | Admitting: *Deleted

## 2012-06-19 LAB — CBC
HCT: 33.3 % — ABNORMAL LOW (ref 39.0–52.0)
MCH: 31.7 pg (ref 26.0–34.0)
MCV: 88.8 fL (ref 78.0–100.0)
Platelets: 152 10*3/uL (ref 150–400)
RDW: 12.8 % (ref 11.5–15.5)

## 2012-06-19 LAB — BASIC METABOLIC PANEL
BUN: 24 mg/dL — ABNORMAL HIGH (ref 6–23)
Calcium: 8.4 mg/dL (ref 8.4–10.5)
Creatinine, Ser: 1.16 mg/dL (ref 0.50–1.35)
GFR calc Af Amer: 68 mL/min — ABNORMAL LOW (ref >90)

## 2012-06-19 MED ORDER — NEBIVOLOL HCL 10 MG PO TABS: 10.0000 mg | ORAL_TABLET | Freq: Every day | ORAL | Status: DC

## 2012-06-19 NOTE — Discharge Summary (Signed)
[facility] Discharge Summary  Alfred Armstrong 07-29-1931 79 y.o. male  [id number]  Admission Date: 06/18/2012  Discharge Date: 06/19/12  Physician: Chuck Hint, MD  Admission Diagnosis: RIGHT ICA STENOSIS   HPI:   This is a 79 y.o. male who presents with chief complaint: history of stroke and follow up carotid 6 month appointment. .  Previous carotid studies demonstrated: RICA 60-79% stenosis, LICA 100% stenosis. Patient has had history of TIA or stroke symptom. The patient has not had amaurosis fugax or monocular blindness. The patient has not had facial drooping or hemiplegia. The patient has not had receptive or expressive aphasia.  The patient's previous neurologic deficits expressive aphasia have resolved. He does have horse ness to his voice.   Hospital Course:  The patient was admitted to the hospital and taken to the operating room on 06/18/2012 and underwent right carotid endarterectomy.  The pt tolerated the procedure well and was transported to the PACU in good condition.   By POD 1, the pt was doing well and neuro exam is in tact.  The pt was tolerating a diet and ambulating and voiding.  The remainder of the hospital course consisted of increasing mobilization and increasing intake of solids without difficulty.    Recent Labs  06/19/12 0330  NA 138  K 3.7  CL 107  CO2 22  GLUCOSE 147*  BUN 24*  CALCIUM 8.4    Recent Labs  06/19/12 0330  WBC 9.3  HGB 11.9*  HCT 33.3*  PLT 152   No results found for this basename: INR,  in the last 72 hours  Discharge Instructions:   The patient is discharged to home with extensive instructions on wound care and progressive ambulation.  They are instructed not to drive or perform any heavy lifting until returning to see the physician in his office.  Discharge Orders   Future Appointments Provider Department Dept Phone   07/15/2012 2:15 PM Chuck Hint, MD [facility] [phone]   09/23/2012 1:00 PM [name], MD [facility] [phone]   06/10/2013 1:00 PM [facility] Only [facility] [phone]   06/17/2013 1:30 PM Exie Parody, MD [facility] [phone]   Future Orders Complete By Expires     CAROTID Sugery: Call MD for difficulty swallowing or speaking; weakness in arms or legs that is a new symtom; severe headache.  If you have increased swelling in the neck and/or  are having difficulty breathing, CALL 911  As directed     Call MD for:  redness, tenderness, or signs of infection (pain, swelling, bleeding, redness, odor or green/yellow discharge around incision site)  As directed     Call MD for:  severe or increased pain, loss or decreased feeling  in affected limb(s)  As directed     Call MD for:  temperature >100.5  As directed     Discharge wound care:  As directed     Comments:      Shower daily with soap and water starting 06/19/12    Driving Restrictions  As directed     Comments:      No driving for 2 weeks    Lifting restrictions  As directed     Comments:      No lifting for 4 weeks    Resume previous diet  As directed        Discharge  Diagnosis:  RIGHT ICA STENOSIS  Secondary Diagnosis: Patient Active Problem List   Diagnosis Date Noted  . MGUS (monoclonal gammopathy of unknown significance) 06/17/2011  . PHN (postherpetic neuralgia) 05/09/2011  . Kidney stone on left side 04/10/2011  . CAD (coronary artery disease) 02/05/2011  . Hyperglycemia 02/01/2011  . Peripheral neuropathy 07/18/2010  . Vitamin D deficient osteomalacia 07/18/2010  . UNSPECIFIED PERIPHERAL VASCULAR DISEASE 01/23/2010  . HYPERTENSION, BENIGN ESSENTIAL 07/05/2009  . HYPERLIPIDEMIA-MIXED 06/06/2009  . HYPERTROPHY PROSTATE W/UR OBST & OTH LUTS 05/31/2009  . CAROTID ARTERY STENOSIS 01/30/2009  . COPD 09/06/2008  . GERD 09/06/2008  .  OSTEOARTHRITIS 09/06/2008   Past Medical History  Diagnosis Date  . COPD (chronic obstructive pulmonary disease)   . Osteoarthrosis, unspecified whether generalized or localized, unspecified site   . History of nephrolithiasis   . GERD (gastroesophageal reflux disease)   . Tubulovillous adenoma polyp of colon     w/HGD 1999  . Hyperlipidemia   . Carotid stenosis     Followed by Dr Madilyn Fireman - Angiogram 5/10 showed left common carotid to be totally occluded and 50% RICA stenosis  . HTN (hypertension)     ACEI Cough  . CVA (cerebral infarction)   . Shingles   . MGUS (monoclonal gammopathy of unknown significance) 06/17/2011  . CAD (coronary artery disease)     Dr. Shirlee Latch  . Anxiety     related to hosp. enviromental   . Shortness of breath     uses symbicort daily, when taking the garbage   . H/O hiatal hernia   . Headache     annoyance related to enviromental allergies   . Neuromuscular disorder     neuropathy      Medication List    TAKE these medications       acetaminophen 500 MG tablet  Commonly known as:  TYLENOL  Take 1,000 mg by mouth 2 (two) times daily as needed for pain.     aspirin 81 MG EC tablet  Take 81 mg by mouth daily.     budesonide-formoterol 160-4.5 MCG/ACT inhaler  Commonly known as:  SYMBICORT  Inhale 2 puffs into the lungs 2 (two) times daily.     CALCIUM CITRATE + PO  Take 2 tablets by mouth daily.     cholecalciferol 1000 UNITS tablet  Commonly known as:  VITAMIN D  Take 1,000 Units by mouth 2 (two) times daily.     losartan 25 MG tablet  Commonly known as:  COZAAR  Take 12.5 mg by mouth daily.     mometasone 50 MCG/ACT nasal spray  Commonly known as:  NASONEX  Place 2 sprays into the nose daily.     nebivolol 10 MG tablet  Commonly known as:  BYSTOLIC  Take 1 tablet (10 mg total) by mouth daily before breakfast.  Start taking on:  06/21/2012     oxyCODONE 5 MG immediate release tablet  Commonly known as:  ROXICODONE  Take 1 tablet  (5 mg total) by mouth every 6 (six) hours as needed for pain.     pregabalin 50 MG capsule  Commonly known as:  LYRICA  Take 50 mg by mouth 3 (three) times daily as needed (for pain).     rosuvastatin 20 MG tablet  Commonly known as:  CRESTOR  Take 20 mg by mouth daily.     traMADol-acetaminophen 37.5-325 MG per tablet  Commonly known as:  ULTRACET  Take 1 tablet by mouth every evening.  vitamin C 1000 MG tablet  Take 1,000 mg by mouth daily.     zolpidem 10 MG tablet  Commonly known as:  AMBIEN  Take 10 mg by mouth at bedtime as needed for sleep.        Roxicodone #30 No Refill-told not to take Ultracet with roxicodone  Disposition: home  Patient's condition: is Good  Follow up: 1. Dr.  Edilia Bo in 2 weeks.   Doreatha Massed, PA-C [facility] [phone]  --- For [facility] use --- Instructions: Press F2 to tab through selections.  Delete question if not applicable.   Modified Rankin score at D/C (0-6): 0   IV medication needed for:  1. Hypertension: No 2. Hypotension: Yes  Post-op Complications: No  1. Post-op CVA or TIA: No  If yes: Event classification (right eye, left eye, right cortical, left cortical, verterobasilar, other): n/a  If yes: Timing of event (intra-op, <6 hrs post-op, >=6 hrs post-op, unknown): n/a  2. CN injury: No  If yes: CN n/a injuried   3. Myocardial infarction: No  If yes: Dx by (EKG or clinical, Troponin): n/a  4.  CHF: No  5.  Dysrhythmia (new): No  6. Wound infection: No  7. Reperfusion symptoms: No  8. Return to OR: No  If yes: return to OR for (bleeding, neurologic, other CEA incision, other): n/a  Discharge medications: Statin use:  Yes If No: [ ]  For Medical reasons, [ ]  Non-compliant, [ ]  Not-indicated ASA use:  Yes  If No: [ ]  For Medical reasons, [ ]  Non-compliant, [ ]  Not-indicated Beta blocker use:  Yes If No: [ ]  For Medical reasons, [ ]  Non-compliant, [ ]   Not-indicated ACE-Inhibitor use:  No-pt on ARB If No: [ ]  For Medical reasons, [ ]  Non-compliant, [ ]  Not-indicated P2Y12 Antagonist use: no, [ ]  Plavix, [ ]  Plasugrel, [ ]  Ticlopinine, [ ]  Ticagrelor, [ ]  Other, [ ]  No for medical reason, [ ]  Non-compliant, [ ]  Not-indicated Anti-coagulant use:  no, [ ]  Warfarin, [ ]  Rivaroxaban, [ ]  Dabigatran, [ ]  Other, [ ]  No for medical reason, [ ]  Non-compliant, [ ]  Not-indicated

## 2012-06-19 NOTE — Progress Notes (Signed)
Pt. Discharged home with discharge instructions and prescription for Oxy.    Vivi Martens RN

## 2012-06-19 NOTE — Progress Notes (Signed)
VASCULAR SURGERY PROGRESS NOTE  SUBJECTIVE: No specific complaints.   PHYSICAL EXAM: Filed Vitals:   06/19/12 0400 06/19/12 0500 06/19/12 0600 06/19/12 0630  BP: 104/42 121/47 115/47 115/47  Pulse: 56 60 52 56  Temp:      TempSrc:      Resp: 19 15 15 17   Height:      Weight:      SpO2: 91% 92% 93% 93%   Right neck incision looks fine. Neuro intact. Tongue midline.   LABS: Lab Results  Component Value Date   WBC 9.3 06/19/2012   HGB 11.9* 06/19/2012   HCT 33.3* 06/19/2012   MCV 88.8 06/19/2012   PLT 152 06/19/2012   ASSESSMENT AND PLAN:  * 1 Day Post-Op s/p: Right CEA  * GU: Foley out.  * DVT prophylaxis: Lovenox   * Statin: Lipitor (home med)  * Disposition: home this AM after he eats, voids, and walks.    Waverly Ferrari, MD, FACS Beeper [phone] 06/19/2012

## 2012-06-19 NOTE — Progress Notes (Signed)
Held Bystolic due at 0800 due to heart rate being in the 40's.  Dopamine gtt has been off since 06:30; bp currently 114/49; Will continue to monitor.  Vivi Martens RN

## 2012-06-19 NOTE — Discharge Summary (Signed)
Agree with plans for d/c today.  Waverly Ferrari, MD, FACS Beeper [phone] 06/19/2012

## 2012-06-24 ENCOUNTER — Ambulatory Visit (INDEPENDENT_AMBULATORY_CARE_PROVIDER_SITE_OTHER): Payer: Medicare Other | Admitting: Internal Medicine

## 2012-06-24 ENCOUNTER — Encounter: Payer: Self-pay | Admitting: Internal Medicine

## 2012-06-24 VITALS — BP 140/70 | HR 57 | Temp 98.4°F | Resp 16 | Wt 230.5 lb

## 2012-06-24 DIAGNOSIS — I10 Essential (primary) hypertension: Secondary | ICD-10-CM

## 2012-06-24 NOTE — Progress Notes (Signed)
  Subjective:    Patient ID: Alfred Armstrong, male    DOB: 1931/07/16, 79 y.o.   MRN: [id number]  Hypertension This is a chronic problem. The current episode started more than 1 year ago. The problem has been gradually improving since onset. The problem is controlled. Associated symptoms include anxiety. Pertinent negatives include no blurred vision, chest pain, headaches, malaise/fatigue, neck pain, orthopnea, palpitations, peripheral edema, PND, shortness of breath or sweats. There are no associated agents to hypertension. Past treatments include beta blockers and angiotensin blockers. The current treatment provides significant improvement. Compliance problems include exercise and diet.  Hypertensive end-organ damage includes CAD/MI.      Review of Systems  Constitutional: Negative.  Negative for malaise/fatigue.  HENT: Negative.  Negative for neck pain.   Eyes: Negative.  Negative for blurred vision.  Respiratory: Negative.  Negative for shortness of breath.   Cardiovascular: Negative.  Negative for chest pain, palpitations, orthopnea and PND.  Gastrointestinal: Negative.   Endocrine: Negative.   Genitourinary: Negative.   Musculoskeletal: Negative.   Skin: Positive for wound.  Allergic/Immunologic: Negative.   Neurological: Negative.  Negative for headaches.  Hematological: Negative.   Psychiatric/Behavioral: Negative.        Objective:   Physical Exam  Vitals reviewed. Constitutional: He is oriented to person, place, and time. He appears well-developed and well-nourished. No distress.  HENT:  Head: Normocephalic and atraumatic.  Mouth/Throat: Oropharynx is clear and moist. No oropharyngeal exudate.  Eyes: Conjunctivae are normal. Right eye exhibits no discharge. Left eye exhibits no discharge. No scleral icterus.  Neck: Normal range of motion. Neck supple. No JVD present. Carotid bruit is not present. No tracheal deviation present. No mass and no thyromegaly present.     Cardiovascular: Normal rate, regular rhythm, normal heart sounds and intact distal pulses.  Exam reveals no gallop and no friction rub.   No murmur heard. Pulmonary/Chest: Effort normal and breath sounds normal. No stridor. No respiratory distress. He has no wheezes. He has no rales. He exhibits no tenderness.  Abdominal: Soft. Bowel sounds are normal. He exhibits no distension and no mass. There is no tenderness. There is no rebound and no guarding.  Musculoskeletal: Normal range of motion. He exhibits no edema and no tenderness.  Lymphadenopathy:    He has no cervical adenopathy.  Neurological: He is oriented to person, place, and time.  Skin: Skin is warm and dry. No rash noted. He is not diaphoretic. No erythema. No pallor.  Psychiatric: He has a normal mood and affect. His behavior is normal. Judgment and thought content normal.     Lab Results  Component Value Date   WBC 9.3 06/19/2012   HGB 11.9* 06/19/2012   HCT 33.3* 06/19/2012   PLT 152 06/19/2012   GLUCOSE 147* 06/19/2012   CHOL 134 10/29/2011   TRIG 82.0 10/29/2011   HDL 52.70 10/29/2011   LDLCALC 65 10/29/2011   ALT 28 06/12/2012   AST 24 06/12/2012   NA 138 06/19/2012   K 3.7 06/19/2012   CL 107 06/19/2012   CREATININE 1.16 06/19/2012   BUN 24* 06/19/2012   CO2 22 06/19/2012   TSH 0.53 12/14/2010   PSA 3.05 12/14/2010   INR 0.99 06/12/2012   HGBA1C 6.2 03/25/2012       Assessment & Plan:

## 2012-06-24 NOTE — Patient Instructions (Signed)

## 2012-06-26 ENCOUNTER — Encounter: Payer: Self-pay | Admitting: Internal Medicine

## 2012-06-26 NOTE — Assessment & Plan Note (Signed)
His BP is well controlled He will continue his current med regimen

## 2012-07-07 ENCOUNTER — Encounter: Payer: Self-pay | Admitting: Vascular Surgery

## 2012-07-08 ENCOUNTER — Ambulatory Visit (INDEPENDENT_AMBULATORY_CARE_PROVIDER_SITE_OTHER): Payer: Medicare Other | Admitting: Vascular Surgery

## 2012-07-08 ENCOUNTER — Encounter: Payer: Self-pay | Admitting: Vascular Surgery

## 2012-07-08 DIAGNOSIS — I6529 Occlusion and stenosis of unspecified carotid artery: Secondary | ICD-10-CM

## 2012-07-08 NOTE — Progress Notes (Signed)
[facility]  Subjective  - 2 weeks s/p right carotid endarterectomy.  He states he has some hoarseness, but it seems to be getting better.   Objective 124/62 54     97%  Right neck incision is clean and dry. No tongue deviation or facial droop is noted. Grip strength is 5/5 equal bilateral  Assessment/Planning: S/P right carotid endarterectomy F/U in 6 month for carotid duplex. Clinton Gallant [facility] 07/08/2012 2:21 PM --  Agree with above. I've ordered a follow up carotid duplex scan in 6 months and I'll see him back at that time. He knows to call sooner if he has problems. In the meantime he knows to continue taking his aspirin. He is on a statin.  Waverly Ferrari, MD, FACS Beeper [phone] 07/08/2012

## 2012-07-15 ENCOUNTER — Ambulatory Visit: Payer: Medicare Other | Admitting: Vascular Surgery

## 2012-07-16 ENCOUNTER — Other Ambulatory Visit: Payer: Self-pay | Admitting: *Deleted

## 2012-08-21 ENCOUNTER — Telehealth: Payer: Self-pay

## 2012-08-21 MED ORDER — ZOLPIDEM TARTRATE 10 MG PO TABS: 10.0000 mg | ORAL_TABLET | Freq: Every evening | ORAL | Status: DC | PRN

## 2012-08-21 NOTE — Telephone Encounter (Signed)
Please advise if ok to fill zolpidem 10 mg qd

## 2012-08-21 NOTE — Telephone Encounter (Signed)
yes

## 2012-08-24 ENCOUNTER — Ambulatory Visit (INDEPENDENT_AMBULATORY_CARE_PROVIDER_SITE_OTHER): Payer: Medicare Other | Admitting: Internal Medicine

## 2012-08-24 ENCOUNTER — Other Ambulatory Visit (INDEPENDENT_AMBULATORY_CARE_PROVIDER_SITE_OTHER): Payer: Medicare Other

## 2012-08-24 ENCOUNTER — Encounter: Payer: Self-pay | Admitting: Internal Medicine

## 2012-08-24 VITALS — BP 114/64 | HR 52 | Temp 98.6°F | Resp 16 | Ht 73.0 in | Wt 230.0 lb

## 2012-08-24 DIAGNOSIS — R7309 Other abnormal glucose: Secondary | ICD-10-CM

## 2012-08-24 DIAGNOSIS — E785 Hyperlipidemia, unspecified: Secondary | ICD-10-CM

## 2012-08-24 DIAGNOSIS — R0602 Shortness of breath: Secondary | ICD-10-CM

## 2012-08-24 DIAGNOSIS — I10 Essential (primary) hypertension: Secondary | ICD-10-CM

## 2012-08-24 DIAGNOSIS — I2510 Atherosclerotic heart disease of native coronary artery without angina pectoris: Secondary | ICD-10-CM

## 2012-08-24 DIAGNOSIS — R739 Hyperglycemia, unspecified: Secondary | ICD-10-CM

## 2012-08-24 LAB — CBC WITH DIFFERENTIAL/PLATELET
Eosinophils Absolute: 0.3 10*3/uL (ref 0.0–0.7)
MCHC: 34 g/dL (ref 30.0–36.0)
MCV: 92.1 fl (ref 78.0–100.0)
Monocytes Absolute: 0.7 10*3/uL (ref 0.1–1.0)
Neutrophils Relative %: 55.6 % (ref 43.0–77.0)
Platelets: 239 10*3/uL (ref 150.0–400.0)
RDW: 13.9 % (ref 11.5–14.6)
WBC: 8 10*3/uL (ref 4.5–10.5)

## 2012-08-24 LAB — LIPID PANEL
HDL: 51.1 mg/dL (ref >39.00)
Total CHOL/HDL Ratio: 3

## 2012-08-24 LAB — COMPREHENSIVE METABOLIC PANEL
ALT: 28 U/L (ref 0–53)
AST: 24 U/L (ref 0–37)
CO2: 25 mEq/L (ref 19–32)
Calcium: 9.6 mg/dL (ref 8.4–10.5)
Chloride: 106 mEq/L (ref 96–112)
Creatinine, Ser: 1.1 mg/dL (ref 0.4–1.5)
Potassium: 4.4 mEq/L (ref 3.5–5.1)
Sodium: 138 mEq/L (ref 135–145)
Total Protein: 7.8 g/dL (ref 6.0–8.3)

## 2012-08-24 LAB — TSH: TSH: 0.65 u[IU]/mL (ref 0.35–5.50)

## 2012-08-24 LAB — CARDIAC PANEL: Total CK: 85 U/L (ref 7–232)

## 2012-08-24 LAB — TROPONIN I: Troponin I: 0.01 ng/mL (ref <0.06)

## 2012-08-24 NOTE — Assessment & Plan Note (Signed)
His EKG is unchanged I will check his labs today to see if he is having ischemia He is due for a myocardial perfusion imaging test

## 2012-08-24 NOTE — Progress Notes (Signed)
  Subjective:    Patient ID: Alfred Armstrong, male    DOB: Feb 17, 1931, 79 y.o.   MRN: [id number]  HPI  Her returns with c/o gradual onset DOE over the last month.  Review of Systems  Constitutional: Positive for unexpected weight change (some weight gain). Negative for fever, chills, diaphoresis, activity change, appetite change and fatigue.  HENT: Negative.   Eyes: Negative.   Respiratory: Positive for shortness of breath. Negative for apnea, cough, choking, chest tightness, wheezing and stridor.   Cardiovascular: Negative.  Negative for chest pain, palpitations and leg swelling.  Gastrointestinal: Negative.  Negative for nausea, abdominal pain, diarrhea and constipation.  Endocrine: Negative.   Genitourinary: Negative.   Musculoskeletal: Negative.  Negative for myalgias, back pain, joint swelling, arthralgias and gait problem.  Skin: Negative.  Negative for color change, pallor, rash and wound.  Neurological: Negative.  Negative for dizziness, weakness and light-headedness.  Hematological: Negative.  Negative for adenopathy. Does not bruise/bleed easily.  Psychiatric/Behavioral: Negative.        Objective:   Physical Exam  Vitals reviewed. Constitutional: He is oriented to person, place, and time. He appears well-developed and well-nourished. No distress.  HENT:  Head: Normocephalic and atraumatic.  Mouth/Throat: Oropharynx is clear and moist. No oropharyngeal exudate.  Eyes: Conjunctivae are normal. Right eye exhibits no discharge. Left eye exhibits no discharge. No scleral icterus.  Neck: Normal range of motion. Neck supple. No JVD present. No tracheal deviation present. No thyromegaly present.  Cardiovascular: Normal rate, regular rhythm, normal heart sounds and intact distal pulses.  Exam reveals no gallop and no friction rub.   No murmur heard. Pulmonary/Chest: Effort normal and breath sounds normal. No stridor. No respiratory distress. He has no wheezes. He has no rales. He  exhibits no tenderness.  Abdominal: Soft. Bowel sounds are normal. He exhibits no distension. There is no tenderness. There is no rebound and no guarding.  Musculoskeletal: Normal range of motion. He exhibits no edema and no tenderness.  Lymphadenopathy:    He has no cervical adenopathy.  Neurological: He is oriented to person, place, and time.  Skin: Skin is warm and dry. No rash noted. He is not diaphoretic. No erythema. No pallor.  Psychiatric: He has a normal mood and affect. His behavior is normal. Judgment and thought content normal.     Lab Results  Component Value Date   WBC 9.3 06/19/2012   HGB 11.9* 06/19/2012   HCT 33.3* 06/19/2012   PLT 152 06/19/2012   GLUCOSE 147* 06/19/2012   CHOL 134 10/29/2011   TRIG 82.0 10/29/2011   HDL 52.70 10/29/2011   LDLCALC 65 10/29/2011   ALT 28 06/12/2012   AST 24 06/12/2012   NA 138 06/19/2012   K 3.7 06/19/2012   CL 107 06/19/2012   CREATININE 1.16 06/19/2012   BUN 24* 06/19/2012   CO2 22 06/19/2012   TSH 0.53 12/14/2010   PSA 3.05 12/14/2010   INR 0.99 06/12/2012   HGBA1C 6.2 03/25/2012       Assessment & Plan:

## 2012-08-24 NOTE — Assessment & Plan Note (Signed)
His BP is well controlled 

## 2012-08-24 NOTE — Assessment & Plan Note (Signed)
His EKG is unchanged I will check labs today to screen for ischemia and will look at other labs that may cause SOB He will get a myocardial perfusion image done

## 2012-08-24 NOTE — Assessment & Plan Note (Signed)
FLP today 

## 2012-08-24 NOTE — Patient Instructions (Signed)

## 2012-08-24 NOTE — Assessment & Plan Note (Signed)
I will check his A1C to see if he has developed DM2 

## 2012-08-25 NOTE — Addendum Note (Signed)
Addended by: [name] on: 08/25/2012 07:38 AM   Modules accepted: Orders

## 2012-08-27 ENCOUNTER — Encounter: Payer: Self-pay | Admitting: Cardiology

## 2012-08-27 ENCOUNTER — Ambulatory Visit (INDEPENDENT_AMBULATORY_CARE_PROVIDER_SITE_OTHER): Payer: Medicare Other | Admitting: Cardiology

## 2012-08-27 VITALS — BP 118/70 | HR 58 | Ht 73.0 in | Wt 234.0 lb

## 2012-08-27 DIAGNOSIS — R0609 Other forms of dyspnea: Secondary | ICD-10-CM

## 2012-08-27 DIAGNOSIS — R0602 Shortness of breath: Secondary | ICD-10-CM

## 2012-08-27 DIAGNOSIS — E785 Hyperlipidemia, unspecified: Secondary | ICD-10-CM

## 2012-08-27 DIAGNOSIS — R0989 Other specified symptoms and signs involving the circulatory and respiratory systems: Secondary | ICD-10-CM

## 2012-08-27 DIAGNOSIS — I2510 Atherosclerotic heart disease of native coronary artery without angina pectoris: Secondary | ICD-10-CM

## 2012-08-27 DIAGNOSIS — R079 Chest pain, unspecified: Secondary | ICD-10-CM

## 2012-08-27 DIAGNOSIS — R0600 Dyspnea, unspecified: Secondary | ICD-10-CM

## 2012-08-27 NOTE — Patient Instructions (Addendum)
Your physician has requested that you have an echocardiogram. Echocardiography is a painless test that uses sound waves to create images of your heart. It provides your doctor with information about the size and shape of your heart and how well your heart's chambers and valves are working. This procedure takes approximately one hour. There are no restrictions for this procedure.  Your physician has requested that you have en exercise stress myoview. For further information please visit [email or web address] Please follow instruction sheet, as given.   OK TO SCHEDULE THE SAME DAY PER DR [facility]  Your physician wants you to follow-up in: 6 months with Dr Shirlee Latch. (January 2015). You will receive a reminder letter in the mail two months in advance. If you don't receive a letter, please call our office to schedule the follow-up appointment.

## 2012-08-28 DIAGNOSIS — R0600 Dyspnea, unspecified: Secondary | ICD-10-CM | POA: Insufficient documentation

## 2012-08-28 DIAGNOSIS — R079 Chest pain, unspecified: Secondary | ICD-10-CM | POA: Insufficient documentation

## 2012-08-28 NOTE — Progress Notes (Signed)
Patient ID: [name], male   DOB: February 26, 1931, 79 y.o.   MRN: [id number] PCP: Dr. Yetta Armstrong  79 yo with history of CAD s/p CABG, carotid stenosis, and COPD presents for cardiology followup.  He had a myoview suggesting ischemia in 5/10 followed by catheterization showing patent LIMA and SVG-PLV. There was a chronic occlusion of the free radial to diagonal graft. There was no evidence by catheterization for lesion causing significant ischemia. He sees pulmonology for COPD. Echo in 7/11 showed preserved LV systolic function with basal inferoposterior akinesis.  Most recently, he had right CEA in 6/14.    Recently, patient reports increased exertional dyspnea.  He is short of breath carrying garbage to the garbage can.  He is short of breath with steps.  No orthopnea/PND.  No problems on flat ground.  He also has occasional chest tightness of short duration.  No particular trigger.  The increased exertional dyspnea has him concerned.  He has also gained 12 lbs since last appointment.  He has not been exercising much at all since CEA.   Labs (1/11): BNP 57, TSH normal, LDL 98, HDL 49  Labs (12/12): K 4.4, creatinine 1.05 Labs (1/13): LDL 48, HDL 51 Labs (3/13): K 4.5, creatinine 1.1 Labs (10/13): K 4.1, creatinine 1.0, LDL 65, HDL 53 Labs (7/14): LDL 79, HDL 51, K 4.4, creatinine 1.1, BNP 116  Allergies (verified):  No Known Drug Allergies   Past Medical History:  1. CAD, ARTERY BYPASS GRAFT: s/p CABG in Alfred [facility] in 1989. Had myoview in 5/10 showing EF 50% and apical ischemia. LHC was done in 5/10 showing 90% pLAD, 90% mLAD, 60% mCFX, 70% pRCA, 70% mRCA, 95% dRCA, SVG-PLV patent, LIMA-LAD patent, no other grafts found by aortic root shot. Free radial-D presumed occluded.  Echo (5/11): EF 55%, mild LVH, basal inferoposterior akinesis, normal RV.  2. COPD (ICD-496): PFTs 9/10 with FVC 65%, FEV1 57%. 3. OSTEOARTHRITIS (ICD-715.90)  4. NEPHROLITHIASIS, HX OF (ICD-V13.01)  5. GERD (ICD-530.81)   6. Tubulovillous adenomatous colon polyps with HGD 1999  7. Hyperlipidemia  8. Carotid stenosis: Followed by Dr. Edilia Armstrong. Angiogram 5/10 showed left common carotid to be totally occluded and 50% RICA stenosis.  Right CEA 6/14.  9. HTN: ACEI cough  10. CVA  11. Shingles  12. MGUS: Followed by Dr. Gaylyn Armstrong.   Family History:  father died of a self-inflicted wound age 77, history of EtOH  mother died age 45 history of EtOH  One sister is well   Social History:   Retired  Single  former Personnel officer company in [employer]  Regular exercise-yes  Alcohol use-no  Quit smoking around 20 years ago.  Alcohol Use - no  Daily Caffeine Use: 2-3 cups weekly   ROS: All systems reviewed and negative except as per HPI.    Current Outpatient Prescriptions  Medication Sig Dispense Refill  . acetaminophen (TYLENOL) 500 MG tablet Take 1,000 mg by mouth 2 (two) times daily as needed for pain.      Marland Kitchen aspirin 81 MG EC tablet Take 81 mg by mouth daily.       . budesonide-formoterol (SYMBICORT) 160-4.5 MCG/ACT inhaler Inhale 2 puffs into the lungs 2 (two) times daily.  4 Inhaler  0  . cholecalciferol (VITAMIN D) 1000 UNITS tablet Take 1,000 Units by mouth 2 (two) times daily.      Marland Kitchen losartan (COZAAR) 25 MG tablet Take 12.5 mg by mouth daily.      . mometasone (  NASONEX) 50 MCG/ACT nasal spray Place 2 sprays into the nose daily.      . Multiple Vitamins-Minerals (ONE-A-DAY MENS 50+ ADVANTAGE PO) Take by mouth.      . nebivolol (BYSTOLIC) 10 MG tablet Take 1 tablet (10 mg total) by mouth daily before breakfast.      . pregabalin (LYRICA) 50 MG capsule Take 50 mg by mouth 3 (three) times daily as needed (for pain).      . rosuvastatin (CRESTOR) 20 MG tablet Take 20 mg by mouth daily.      . traMADol-acetaminophen (ULTRACET) 37.5-325 MG per tablet Take 1 tablet by mouth every evening.      . zolpidem (AMBIEN) 10 MG tablet Take 1 tablet (10 mg total) by mouth at bedtime as needed for sleep.  30 tablet  5   . [DISCONTINUED] Hypertonic Nasal Wash (SINUS RINSE BOTTLE KIT NA) by Nasal route as directed.         No current facility-administered medications for this visit.   BP 118/70  Pulse 58  Ht 6\' 1"  (1.854 m)  Wt 106.142 kg (234 lb)  BMI 30.88 kg/m2  SpO2 97% General: NAD, overweight Neck: No JVD, no thyromegaly or thyroid nodule.  Lungs: Clear to auscultation bilaterally with normal respiratory effort. CV: Nondisplaced PMI.  Heart regular S1/S2, no S3/S4, no murmur.  Trace left ankle edema (chronic since CABG).  Bilateral carotid bruits.  Normal pedal pulses.  Abdomen: Soft, nontender, no hepatosplenomegaly, no distention.  Neurologic: Alert and oriented x 3.  Psych: Normal affect. Extremities: No clubbing or cyanosis.   Assessment/Plan:  CAD S/p CABG. Last cath showed 2/3 patent grafts. Recently, he has had increased frequency of atypical chest tightness but has also developed exertional dyspnea that is new and concerning to him. - Continue ASA 81, Crestor, losartan, and nebivolol.  - I am going to arrange for ETT-Sestamibi for risk stratification given atypical chest pain and exertional dyspnea.  - I will get an echocardiogram to make sure that LV systolic function remains normal.   - If stress test and echo are unremarkable, probably symptoms due to obesity and deconditioning (weight is up, has not been exercising much since CEA).   CAROTID ARTERY STENOSIS  Has regular followup at [facility] for significant carotid disease. S/p right CEA in 6/14.  HYPERLIPIDEMIA-MIXED  Good LDL when recently checked.   Alfred Armstrong 08/28/2012

## 2012-09-02 ENCOUNTER — Ambulatory Visit (HOSPITAL_COMMUNITY): Payer: Medicare Other | Attending: Cardiology | Admitting: Radiology

## 2012-09-02 ENCOUNTER — Ambulatory Visit (HOSPITAL_BASED_OUTPATIENT_CLINIC_OR_DEPARTMENT_OTHER): Payer: Medicare Other | Admitting: Radiology

## 2012-09-02 ENCOUNTER — Other Ambulatory Visit (HOSPITAL_COMMUNITY): Payer: Self-pay | Admitting: Cardiology

## 2012-09-02 VITALS — BP 150/78 | Ht 72.0 in | Wt 230.0 lb

## 2012-09-02 DIAGNOSIS — I2510 Atherosclerotic heart disease of native coronary artery without angina pectoris: Secondary | ICD-10-CM

## 2012-09-02 DIAGNOSIS — R300 Dysuria: Secondary | ICD-10-CM | POA: Insufficient documentation

## 2012-09-02 DIAGNOSIS — I4949 Other premature depolarization: Secondary | ICD-10-CM

## 2012-09-02 DIAGNOSIS — R0609 Other forms of dyspnea: Secondary | ICD-10-CM | POA: Insufficient documentation

## 2012-09-02 DIAGNOSIS — I6529 Occlusion and stenosis of unspecified carotid artery: Secondary | ICD-10-CM | POA: Insufficient documentation

## 2012-09-02 DIAGNOSIS — R0602 Shortness of breath: Secondary | ICD-10-CM

## 2012-09-02 DIAGNOSIS — R079 Chest pain, unspecified: Secondary | ICD-10-CM | POA: Insufficient documentation

## 2012-09-02 DIAGNOSIS — R0789 Other chest pain: Secondary | ICD-10-CM

## 2012-09-02 DIAGNOSIS — Z87891 Personal history of nicotine dependence: Secondary | ICD-10-CM | POA: Insufficient documentation

## 2012-09-02 DIAGNOSIS — R0989 Other specified symptoms and signs involving the circulatory and respiratory systems: Secondary | ICD-10-CM | POA: Insufficient documentation

## 2012-09-02 DIAGNOSIS — I739 Peripheral vascular disease, unspecified: Secondary | ICD-10-CM | POA: Insufficient documentation

## 2012-09-02 DIAGNOSIS — I10 Essential (primary) hypertension: Secondary | ICD-10-CM | POA: Insufficient documentation

## 2012-09-02 DIAGNOSIS — I25810 Atherosclerosis of coronary artery bypass graft(s) without angina pectoris: Secondary | ICD-10-CM | POA: Insufficient documentation

## 2012-09-02 MED ORDER — REGADENOSON 0.4 MG/5ML IV SOLN
0.4000 mg | Freq: Once | INTRAVENOUS | Status: AC
  Administered 2012-09-02: 0.4 mg via INTRAVENOUS

## 2012-09-02 MED ORDER — TECHNETIUM TC 99M SESTAMIBI GENERIC - CARDIOLITE
30.0000 | Freq: Once | INTRAVENOUS | Status: AC | PRN
  Administered 2012-09-02: 30 via INTRAVENOUS

## 2012-09-02 MED ORDER — PERFLUTREN PROTEIN A MICROSPH IV SUSP
2.0000 mL | Freq: Once | INTRAVENOUS | Status: AC
  Administered 2012-09-02: 2 mL via INTRAVENOUS

## 2012-09-02 MED ORDER — TECHNETIUM TC 99M SESTAMIBI GENERIC - CARDIOLITE
10.0000 | Freq: Once | INTRAVENOUS | Status: AC | PRN
  Administered 2012-09-02: 10 via INTRAVENOUS

## 2012-09-02 NOTE — Progress Notes (Signed)
[facility] SITE 3 NUCLEAR MED [address] [place], [place] [address] [phone]    Cardiology Nuclear Med Study  Alfred Armstrong is a 79 y.o. male     MRN : [id number]     DOB: Dec 30, 1931  Procedure Date: 09/02/2012  Nuclear Med Background Indication for Stress Test:  Evaluation for Ischemia and Graft Patency History:  COPD and 1989 CABG 05/2008 MPS: apical ischemia EF: 50% Heart Cath: 2/3 grafts patent chronic occlusion of free radial to diagonal graft 5/11 ECHO: mild LVH grade I DD Cardiac Risk Factors: Carotid Disease, History of Smoking, Hypertension, Lipids and PVD  Symptoms:  Chest Tightness, DOE, Palpitations and SOB   Nuclear Pre-Procedure Caffeine/Decaff Intake:  None > 12 hrs NPO After: 7:00pm   Lungs:  clear O2 Sat: 94% on room air. IV 0.9% NS with Angio Cath:  22g  IV Site: R Antecubital x 1, tolerated well IV Started by:  Irean Hong, RN  Chest Size (in):  44 Cup Size: n/a  Height: 6' (1.829 m)  Weight:  230 lb (104.327 kg)  BMI:  Body mass index is 31.19 kg/(m^2). Tech Comments:  Regulatory affairs officer and Cozaar this am    Nuclear Med Study 1 or 2 day study: 1 day  Stress Test Type:  Lexiscan  Reading MD: [name], MD  Order Authorizing Provider:  [name], MD  Resting Radionuclide: Technetium 85m Sestamibi  Resting Radionuclide Dose: 11.0 mCi   Stress Radionuclide:  Technetium 20m Sestamibi  Stress Radionuclide Dose: 33.0 mCi           Stress Protocol Rest HR: 47 Stress HR: 69  Rest BP: 150/78 Stress BP: 150/82  Exercise Time (min): n/a METS: n/a   Predicted Max HR: 142 bpm % Max HR: 48.59 bpm Rate Pressure Product: 69629   Dose of Adenosine (mg):  n/a Dose of Lexiscan: 0.4 mg  Dose of Atropine (mg): n/a Dose of Dobutamine: n/a mcg/kg/min (at max HR)  Stress Test Technologist: Milana Na, EMT-P  Nuclear Technologist:  Doyne Keel, CNMT     Rest Procedure:  Myocardial perfusion imaging was performed at rest 45 minutes following  the intravenous administration of Technetium 70m Sestamibi. Rest ECG: NSR - Normal EKG  Stress Procedure:  The patient received IV Lexiscan 0.4 mg over 15-seconds.  Technetium 16m Sestamibi injected at 30-seconds. This patient had sob, lt. Headed, chest heaviness, and a headache with the Lexiscan injection. Quantitative spect images were obtained after a 45 minute delay. Stress ECG: No significant change from baseline ECG  QPS Raw Data Images:  Normal; no motion artifact; normal heart/lung ratio. Stress Images:  There is a medium-sized, moderate basal to apical anterolateral perfusion defect.  There is a small, mild basal to mid inferior perfusion defect.  Rest Images:  There is a small, mild apical anterolateral perfusion defect. Subtraction (SDS):  There is a partially reversible medium-sized, moderate basal to apical anterolateral perfusion defect. There is also a reversible small, mild basal to mid inferior perfusion defect.  Transient Ischemic Dilatation (Normal <1.22):  n/a Lung/Heart Ratio (Normal <0.45):  0.49  Quantitative Gated Spect Images QGS EDV:  165 ml QGS ESV:  85 ml  Impression Exercise Capacity:  Lexiscan with no exercise. BP Response:  Normal blood pressure response. Clinical Symptoms:  Short of breath, chest heaviness.  ECG Impression:  No significant ST segment change suggestive of ischemia. Comparison with Prior Nuclear Study: Significantly different than prior study.   Overall Impression:  Intermediate risk  stress nuclear study with a partially reversible, medium-sized moderate basal to apical anterolateral perfusion defect suggestive of infarction with significan peri-infarct ischemia.  There is also a small, mild reversible basal to mid inferior perfusion defect suggestive of ischemia. .  LV Ejection Fraction: 49%.  LV Wall Motion:  Mild global hypokinesis.   [name] 09/02/2012

## 2012-09-02 NOTE — Progress Notes (Signed)
Echocardiogram performed.  

## 2012-09-03 ENCOUNTER — Telehealth: Payer: Self-pay | Admitting: *Deleted

## 2012-09-03 NOTE — Telephone Encounter (Signed)
Dr. Shirlee Latch calls today with pt stress test results. Would like to schedule him for a cath tomorrow if he is symptomatic otherwise at a later date if not symptomatic Dr. Shirlee Latch will call pt at home later today.  I spoke with pt & he is not having any "breathlessnes" or shortness of breath, no dizziness, no chest pain.  He does have difficulty in arranging for someone to stay with him the first 24 hours after a cath. "[place]  He will await call from Dr. Shirlee Latch & schedule cath at a later date.  Mylo Red RN

## 2012-09-04 ENCOUNTER — Telehealth: Payer: Self-pay | Admitting: Cardiology

## 2012-09-04 DIAGNOSIS — R079 Chest pain, unspecified: Secondary | ICD-10-CM

## 2012-09-04 DIAGNOSIS — R0609 Other forms of dyspnea: Secondary | ICD-10-CM

## 2012-09-04 DIAGNOSIS — Z7901 Long term (current) use of anticoagulants: Secondary | ICD-10-CM

## 2012-09-04 DIAGNOSIS — Z01810 Encounter for preprocedural cardiovascular examination: Secondary | ICD-10-CM

## 2012-09-04 NOTE — Telephone Encounter (Signed)
New Prob  Pt was previously seen and was advised to come in within the next week// there are no avail slots for the MP or PA please advise where we should sched this pt.

## 2012-09-04 NOTE — Telephone Encounter (Signed)
**Note De-Identified  Obfuscation** Pt is advised, per Dr Shirlee Latch, that his heart cath has been scheduled for 09/15/12 at 12:30, pt to arrive at 11:30 am, with Dr Shirlee Latch in the [facility] cath lab. Also, pt states he will come to office on 09/11/12 for lab work. I went over all cath instructions with pt over the phone, he verbalized understanding and I mailed instruction letter to his address.

## 2012-09-07 ENCOUNTER — Telehealth: Payer: Self-pay | Admitting: *Deleted

## 2012-09-07 DIAGNOSIS — I25810 Atherosclerosis of coronary artery bypass graft(s) without angina pectoris: Secondary | ICD-10-CM

## 2012-09-07 NOTE — Telephone Encounter (Signed)
Pt aware of echo results Pt would like to have pre-cath labs drawn at [facility] on 09/10/12 Done Mylo Red RN

## 2012-09-09 ENCOUNTER — Telehealth: Payer: Self-pay | Admitting: *Deleted

## 2012-09-09 ENCOUNTER — Other Ambulatory Visit (INDEPENDENT_AMBULATORY_CARE_PROVIDER_SITE_OTHER): Payer: Medicare Other

## 2012-09-09 DIAGNOSIS — I25810 Atherosclerosis of coronary artery bypass graft(s) without angina pectoris: Secondary | ICD-10-CM

## 2012-09-09 NOTE — Telephone Encounter (Signed)
Please call patient he has not received the package w/ instructions.

## 2012-09-09 NOTE — Telephone Encounter (Signed)
Spoke with patient about instructions for cath 09/15/12

## 2012-09-10 ENCOUNTER — Other Ambulatory Visit: Payer: Medicare Other

## 2012-09-10 LAB — CBC WITH DIFFERENTIAL/PLATELET
Basophils Relative: 0.3 % (ref 0.0–3.0)
Eosinophils Relative: 2.6 % (ref 0.0–5.0)
Hemoglobin: 14.8 g/dL (ref 13.0–17.0)
Lymphocytes Relative: 25.6 % (ref 12.0–46.0)
Monocytes Relative: 7.4 % (ref 3.0–12.0)
Neutro Abs: 5.8 10*3/uL (ref 1.4–7.7)
Neutrophils Relative %: 64.1 % (ref 43.0–77.0)
RBC: 4.73 Mil/uL (ref 4.22–5.81)
WBC: 9.1 10*3/uL (ref 4.5–10.5)

## 2012-09-10 LAB — PROTIME-INR: INR: 1 ratio (ref 0.8–1.0)

## 2012-09-10 LAB — BASIC METABOLIC PANEL
CO2: 24 mEq/L (ref 19–32)
Calcium: 9.6 mg/dL (ref 8.4–10.5)
Creatinine, Ser: 1.1 mg/dL (ref 0.4–1.5)
Sodium: 139 mEq/L (ref 135–145)

## 2012-09-11 ENCOUNTER — Telehealth: Payer: Self-pay | Admitting: *Deleted

## 2012-09-11 ENCOUNTER — Other Ambulatory Visit: Payer: Medicare Other

## 2012-09-11 NOTE — Telephone Encounter (Signed)
Advised patient of lab results  

## 2012-09-11 NOTE — Telephone Encounter (Signed)
Message copied by Burnell Blanks on Fri Sep 11, 2012  9:01 AM ------      Message from: Laurey Morale      Created: Thu Sep 10, 2012  8:55 PM       Labs ok ------

## 2012-09-15 ENCOUNTER — Inpatient Hospital Stay (HOSPITAL_BASED_OUTPATIENT_CLINIC_OR_DEPARTMENT_OTHER)
Admission: RE | Admit: 2012-09-15 | Discharge: 2012-09-15 | Disposition: A | Discharge status: Home / Self Care | Payer: Medicare Other | Source: Ambulatory Visit | Attending: Cardiology | Admitting: Cardiology

## 2012-09-15 ENCOUNTER — Encounter (HOSPITAL_BASED_OUTPATIENT_CLINIC_OR_DEPARTMENT_OTHER)
Admission: RE | Disposition: A | Discharge status: Home / Self Care | Payer: Self-pay | Source: Ambulatory Visit | Attending: Cardiology

## 2012-09-15 DIAGNOSIS — I2510 Atherosclerotic heart disease of native coronary artery without angina pectoris: Secondary | ICD-10-CM

## 2012-09-15 DIAGNOSIS — R0609 Other forms of dyspnea: Secondary | ICD-10-CM | POA: Insufficient documentation

## 2012-09-15 DIAGNOSIS — R0989 Other specified symptoms and signs involving the circulatory and respiratory systems: Secondary | ICD-10-CM | POA: Insufficient documentation

## 2012-09-15 DIAGNOSIS — R9439 Abnormal result of other cardiovascular function study: Secondary | ICD-10-CM | POA: Insufficient documentation

## 2012-09-15 DIAGNOSIS — R0789 Other chest pain: Secondary | ICD-10-CM | POA: Insufficient documentation

## 2012-09-15 SURGERY — JV LEFT HEART CATHETERIZATION WITH CORONARY ANGIOGRAM

## 2012-09-15 MED ORDER — ACETAMINOPHEN 325 MG PO TABS: 650.0000 mg | ORAL_TABLET | ORAL | Status: DC | PRN

## 2012-09-15 MED ORDER — ONDANSETRON HCL 4 MG/2ML IJ SOLN: 4.0000 mg | Freq: Four times a day (QID) | INTRAMUSCULAR | Status: DC | PRN

## 2012-09-15 MED ORDER — SODIUM CHLORIDE 0.9 % IV SOLN
INTRAVENOUS | Status: DC
  Administered 2012-09-15: 12:00:00 via INTRAVENOUS

## 2012-09-15 MED ORDER — SODIUM CHLORIDE 0.9 % IV SOLN: INTRAVENOUS | Status: DC

## 2012-09-15 NOTE — Telephone Encounter (Signed)
New Problem  Out Patient Cath called in to make an appt for this pt w/ in 2 weeks. We do not ave an appt w/ in 2 weeks. Please help and Aurea Graff asks that you call the pt back to schedule the appointment

## 2012-09-15 NOTE — CV Procedure (Signed)
   Cardiac Catheterization Procedure Note  Name: Alfred Armstrong MRN: [id number] DOB: 12-17-1931  Procedure: Left Heart Cath, Selective Coronary Angiography, SVG angiography, LIMA angiography  Indication: Abnormal stress test, exertional dyspnea, atypical chest pain.    Procedural details: The right groin was prepped, draped, and anesthetized with 1% lidocaine. Using modified Seldinger technique, a 5 French sheath was introduced into the right femoral artery. MP catheter and Standard Judkins catheters were used for coronary, SVG, and LIMA angiography. Catheter exchanges were performed over a guidewire. There were no immediate procedural complications. The patient was transferred to the post catheterization recovery area for further monitoring.  Procedural Findings: Hemodynamics:  AO 165/68 LV 158/21   Coronary angiography: Coronary dominance: right  Left mainstem: 30% distal left main stenosis.   Left anterior descending (LAD): 80% proximal LAD stenosis followed by small to moderate D1.  90% mid LAD stenosis.  Patent LIMA-distal LAD with about 40% stenosis just distal to the touchdown in the distal LAD.  There was a free radial to D1 graft that was occluded at the ostium (known from prior study).   Left circumflex (LCx): Small OM1.  50% stenosis in the mid LCx.   Right coronary artery (RCA): Diffuse disease proximally with mid-vessel occlusion. SVG-PLV is patent to large PLV.  The PDA fills late by collaterals.   Left ventriculography: Not done, EF available from recent echo and cardiolite.    Final Conclusions:  No change to coronary/bypass anatomy when compared to prior study from 2010.  Patent LIMA-LAD and SVG-PLV.  There certainly could be ischemia in the PDA and D1 distributions.  These would not be easily approached percutaneously and anatomy is unchanged compared to 2010.   Recommendations: Medical management, followup in 2 wks in office.   [name] 09/15/2012, 1:23  PM

## 2012-09-15 NOTE — Interval H&P Note (Signed)
History and Physical Interval Note:  09/15/2012 12:45 PM  Alfred Armstrong  has presented today for surgery, with the diagnosis of cp  The various methods of treatment have been discussed with the patient and family. After consideration of risks, benefits and other options for treatment, the patient has consented to  Procedure(s): JV LEFT HEART CATHETERIZATION WITH CORONARY ANGIOGRAM (N/A) as a surgical intervention .  The patient's history has been reviewed, patient examined, no change in status, stable for surgery.  I have reviewed the patient's chart and labs.  Questions were answered to the patient's satisfaction.     Naw Lasala [employer]

## 2012-09-15 NOTE — Progress Notes (Signed)
Bedrest begins @ 1300, tegaderm dressing applied to right groin site by Theodoro Grist, site level 0.

## 2012-09-15 NOTE — H&P (View-Only) (Signed)
[facility] SITE 3 NUCLEAR MED [address] [place], [place] [address] [phone]    Cardiology Nuclear Med Study  Alfred Armstrong is a 79 y.o. male     MRN : [id number]     DOB: 10/02/1931  Procedure Date: 09/02/2012  Nuclear Med Background Indication for Stress Test:  Evaluation for Ischemia and Graft Patency History:  COPD and 1989 CABG 05/2008 MPS: apical ischemia EF: 50% Heart Cath: 2/3 grafts patent chronic occlusion of free radial to diagonal graft 5/11 ECHO: mild LVH grade I DD Cardiac Risk Factors: Carotid Disease, History of Smoking, Hypertension, Lipids and PVD  Symptoms:  Chest Tightness, DOE, Palpitations and SOB   Nuclear Pre-Procedure Caffeine/Decaff Intake:  None > 12 hrs NPO After: 7:00pm   Lungs:  clear O2 Sat: 94% on room air. IV 0.9% NS with Angio Cath:  22g  IV Site: R Antecubital x 1, tolerated well IV Started by:  Patsy Edwards, RN  Chest Size (in):  44 Cup Size: n/a  Height: 6' (1.829 m)  Weight:  230 lb (104.327 kg)  BMI:  Body mass index is 31.19 kg/(m^2). Tech Comments:  Took Bystolic and Cozaar this am    Nuclear Med Study 1 or 2 day study: 1 day  Stress Test Type:  Lexiscan  Reading MD: Dung Salinger, MD  Order Authorizing Provider:  Usbaldo Pannone, MD  Resting Radionuclide: Technetium 99m Sestamibi  Resting Radionuclide Dose: 11.0 mCi   Stress Radionuclide:  Technetium 99m Sestamibi  Stress Radionuclide Dose: 33.0 mCi           Stress Protocol Rest HR: 47 Stress HR: 69  Rest BP: 150/78 Stress BP: 150/82  Exercise Time (min): n/a METS: n/a   Predicted Max HR: 142 bpm % Max HR: 48.59 bpm Rate Pressure Product: 10419   Dose of Adenosine (mg):  n/a Dose of Lexiscan: 0.4 mg  Dose of Atropine (mg): n/a Dose of Dobutamine: n/a mcg/kg/min (at max HR)  Stress Test Technologist: Sabrina Williams, EMT-P  Nuclear Technologist:  Tonya Yount, CNMT     Rest Procedure:  Myocardial perfusion imaging was performed at rest 45 minutes following  the intravenous administration of Technetium 99m Sestamibi. Rest ECG: NSR - Normal EKG  Stress Procedure:  The patient received IV Lexiscan 0.4 mg over 15-seconds.  Technetium 99m Sestamibi injected at 30-seconds. This patient had sob, lt. Headed, chest heaviness, and a headache with the Lexiscan injection. Quantitative spect images were obtained after a 45 minute delay. Stress ECG: No significant change from baseline ECG  QPS Raw Data Images:  Normal; no motion artifact; normal heart/lung ratio. Stress Images:  There is a medium-sized, moderate basal to apical anterolateral perfusion defect.  There is a small, mild basal to mid inferior perfusion defect.  Rest Images:  There is a small, mild apical anterolateral perfusion defect. Subtraction (SDS):  There is a partially reversible medium-sized, moderate basal to apical anterolateral perfusion defect. There is also a reversible small, mild basal to mid inferior perfusion defect.  Transient Ischemic Dilatation (Normal <1.22):  n/a Lung/Heart Ratio (Normal <0.45):  0.49  Quantitative Gated Spect Images QGS EDV:  165 ml QGS ESV:  85 ml  Impression Exercise Capacity:  Lexiscan with no exercise. BP Response:  Normal blood pressure response. Clinical Symptoms:  Short of breath, chest heaviness.  ECG Impression:  No significant ST segment change suggestive of ischemia. Comparison with Prior Nuclear Study: Significantly different than prior study.   Overall Impression:  Intermediate risk   stress nuclear study with a partially reversible, medium-sized moderate basal to apical anterolateral perfusion defect suggestive of infarction with significan peri-infarct ischemia.  There is also a small, mild reversible basal to mid inferior perfusion defect suggestive of ischemia. .  LV Ejection Fraction: 49%.  LV Wall Motion:  Mild global hypokinesis.   Alfred Armstrong 09/02/2012           

## 2012-09-19 ENCOUNTER — Other Ambulatory Visit: Payer: Self-pay | Admitting: Internal Medicine

## 2012-09-21 ENCOUNTER — Telehealth: Payer: Self-pay | Admitting: Cardiology

## 2012-09-21 NOTE — Telephone Encounter (Signed)
New problem  The doc prescription for Losartan he is having a problem getting a suppler to give him something that he can cut. Please call pt,

## 2012-09-21 NOTE — Telephone Encounter (Signed)
Pt states he can no longer get losartan 25mg  in a round shape to cut in half, it will only be available in an oval shape that he cannot break evenly in half. (He is taking 1/2 of a 25mg  tablet). He has called several drug stores and has been told by all that losartan 25mg  will no longer be available in a round shape.  I will forward to Dr Shirlee Latch for review.

## 2012-09-21 NOTE — Telephone Encounter (Signed)
Let's have him just take it 25 mg once a day.

## 2012-09-22 MED ORDER — LOSARTAN POTASSIUM 25 MG PO TABS: 25.0000 mg | ORAL_TABLET | Freq: Every day | ORAL | Status: DC

## 2012-09-22 NOTE — Telephone Encounter (Signed)
Pt advised.

## 2012-09-23 ENCOUNTER — Encounter: Payer: Self-pay | Admitting: Internal Medicine

## 2012-09-23 ENCOUNTER — Ambulatory Visit (INDEPENDENT_AMBULATORY_CARE_PROVIDER_SITE_OTHER): Payer: Medicare Other | Admitting: Internal Medicine

## 2012-09-23 VITALS — BP 142/76 | HR 90 | Temp 98.1°F | Resp 16 | Wt 232.0 lb

## 2012-09-23 DIAGNOSIS — IMO0001 Reserved for inherently not codable concepts without codable children: Secondary | ICD-10-CM

## 2012-09-23 DIAGNOSIS — I10 Essential (primary) hypertension: Secondary | ICD-10-CM

## 2012-09-23 MED ORDER — LOSARTAN POTASSIUM 25 MG PO TABS: 50.0000 mg | ORAL_TABLET | Freq: Every day | ORAL | Status: DC

## 2012-09-23 NOTE — Assessment & Plan Note (Signed)
I will increase the dose of losartan

## 2012-09-23 NOTE — Patient Instructions (Signed)
Type 2 Diabetes Mellitus, Adult Type 2 diabetes mellitus, often simply referred to as type 2 diabetes, is a long-lasting (chronic) disease. In type 2 diabetes, the pancreas does not make enough insulin (a hormone), the cells are less responsive to the insulin that is made (insulin resistance), or both. Normally, insulin moves sugars from food into the tissue cells. The tissue cells use the sugars for energy. The lack of insulin or the lack of normal response to insulin causes excess sugars to build up in the blood instead of going into the tissue cells. As a result, high blood sugar (hyperglycemia) develops. The effect of high sugar (glucose) levels can cause many complications. Type 2 diabetes was also previously called adult-onset diabetes but it can occur at any age.  RISK FACTORS  A person is predisposed to developing type 2 diabetes if someone in the family has the disease and also has one or more of the following primary risk factors:  Overweight.  An inactive lifestyle.  A history of consistently eating high-calorie foods. Maintaining a normal weight and regular physical activity can reduce the chance of developing type 2 diabetes. SYMPTOMS  A person with type 2 diabetes may not show symptoms initially. The symptoms of type 2 diabetes appear slowly. The symptoms include:  Increased thirst (polydipsia).  Increased urination (polyuria).  Increased urination during the night (nocturia).  Weight loss. This weight loss may be rapid.  Frequent, recurring infections.  Tiredness (fatigue).  Weakness.  Vision changes, such as blurred vision.  Fruity smell to your breath.  Abdominal pain.  Nausea or vomiting.  Cuts or bruises which are slow to heal.  Tingling or numbness in the hands or feet. DIAGNOSIS Type 2 diabetes is frequently not diagnosed until complications of diabetes are present. Type 2 diabetes is diagnosed when symptoms or complications are present and when blood  glucose levels are increased. Your blood glucose level may be checked by one or more of the following blood tests:  A fasting blood glucose test. You will not be allowed to eat for at least 8 hours before a blood sample is taken.  A random blood glucose test. Your blood glucose is checked at any time of the day regardless of when you ate.  A hemoglobin A1c blood glucose test. A hemoglobin A1c test provides information about blood glucose control over the previous 3 months.  An oral glucose tolerance test (OGTT). Your blood glucose is measured after you have not eaten (fasted) for 2 hours and then after you drink a glucose-containing beverage. TREATMENT   You may need to take insulin or diabetes medicine daily to keep blood glucose levels in the desired range.  You will need to match insulin dosing with exercise and healthy food choices. The treatment goal is to maintain the before meal blood sugar (preprandial glucose) level at 70 130 mg/dL. HOME CARE INSTRUCTIONS   Have your hemoglobin A1c level checked twice a year.  Perform daily blood glucose monitoring as directed by your caregiver.  Monitor urine ketones when you are ill and as directed by your caregiver.  Take your diabetes medicine or insulin as directed by your caregiver to maintain your blood glucose levels in the desired range.  Never run out of diabetes medicine or insulin. It is needed every day.  Adjust insulin based on your intake of carbohydrates. Carbohydrates can raise blood glucose levels but need to be included in your diet. Carbohydrates provide vitamins, minerals, and fiber which are an essential part of   a healthy diet. Carbohydrates are found in fruits, vegetables, whole grains, dairy products, legumes, and foods containing added sugars.    Eat healthy foods. Alternate 3 meals with 3 snacks.  Lose weight if overweight.  Carry a medical alert card or wear your medical alert jewelry.  Carry a 15 gram  carbohydrate snack with you at all times to treat low blood glucose (hypoglycemia). Some examples of 15 gram carbohydrate snacks include:  Glucose tablets, 3 or 4   Glucose gel, 15 gram tube  Raisins, 2 tablespoons (24 grams)  Jelly beans, 6  Animal crackers, 8  Regular pop, 4 ounces (120 mL)  Gummy treats, 9  Recognize hypoglycemia. Hypoglycemia occurs with blood glucose levels of 70 mg/dL and below. The risk for hypoglycemia increases when fasting or skipping meals, during or after intense exercise, and during sleep. Hypoglycemia symptoms can include:  Tremors or shakes.  Decreased ability to concentrate.  Sweating.  Increased heart rate.  Headache.  Dry mouth.  Hunger.  Irritability.  Anxiety.  Restless sleep.  Altered speech or coordination.  Confusion.  Treat hypoglycemia promptly. If you are alert and able to safely swallow, follow the 15:15 rule:  Take 15 20 grams of rapid-acting glucose or carbohydrate. Rapid-acting options include glucose gel, glucose tablets, or 4 ounces (120 mL) of fruit juice, regular soda, or low fat milk.  Check your blood glucose level 15 minutes after taking the glucose.  Take 15 20 grams more of glucose if the repeat blood glucose level is still 70 mg/dL or below.  Eat a meal or snack within 1 hour once blood glucose levels return to normal.    Be alert to polyuria and polydipsia which are early signs of hyperglycemia. An early awareness of hyperglycemia allows for prompt treatment. Treat hyperglycemia as directed by your caregiver.  Engage in at least 150 minutes of moderate-intensity physical activity a week, spread over at least 3 days of the week or as directed by your caregiver. In addition, you should engage in resistance exercise at least 2 times a week or as directed by your caregiver.  Adjust your medicine and food intake as needed if you start a new exercise or sport.  Follow your sick day plan at any time you  are unable to eat or drink as usual.  Avoid tobacco use.  Limit alcohol intake to no more than 1 drink per day for nonpregnant women and 2 drinks per day for men. You should drink alcohol only when you are also eating food. Talk with your caregiver whether alcohol is safe for you. Tell your caregiver if you drink alcohol several times a week.  Follow up with your caregiver regularly.  Schedule an eye exam soon after the diagnosis of type 2 diabetes and then annually.  Perform daily skin and foot care. Examine your skin and feet daily for cuts, bruises, redness, nail problems, bleeding, blisters, or sores. A foot exam by a caregiver should be done annually.  Brush your teeth and gums at least twice a day and floss at least once a day. Follow up with your dentist regularly.  Share your diabetes management plan with your workplace or school.  Stay up-to-date with immunizations.  Learn to manage stress.  Obtain ongoing diabetes education and support as needed.  Participate in, or seek rehabilitation as needed to maintain or improve independence and quality of life. Request a physical or occupational therapy referral if you are having foot or hand numbness or difficulties with grooming,   dressing, eating, or physical activity. SEEK MEDICAL CARE IF:   You are unable to eat food or drink fluids for more than 6 hours.  You have nausea and vomiting for more than 6 hours.  Your blood glucose level is over 240 mg/dL.  There is a change in mental status.  You develop an additional serious illness.  You have diarrhea for more than 6 hours.  You have been sick or have had a fever for a couple of days and are not getting better.  You have pain during any physical activity.  SEEK IMMEDIATE MEDICAL CARE IF:  You have difficulty breathing.  You have moderate to large ketone levels. MAKE SURE YOU:  Understand these instructions.  Will watch your condition.  Will get help right away if  you are not doing well or get worse. Document Released: 01/14/2005 Document Revised: 10/09/2011 Document Reviewed: 08/13/2011 [employer] Patient Information 2014 [employer].  

## 2012-09-23 NOTE — Assessment & Plan Note (Addendum)
He will work on his lifestyle modifications No meds needed at this time He was referred for an eye exam and for diabetic education

## 2012-09-23 NOTE — Progress Notes (Signed)
Subjective:    Patient ID: Alfred Armstrong, male    DOB: 1931/08/06, 79 y.o.   MRN: [id number]  Diabetes He presents for his initial diabetic visit. He has type 2 diabetes mellitus. The initial diagnosis of diabetes was made 1 month ago. There are no hypoglycemic associated symptoms. Pertinent negatives for hypoglycemia include no dizziness. Associated symptoms include foot paresthesias. Pertinent negatives for diabetes include no blurred vision, no chest pain, no fatigue, no foot ulcerations, no polydipsia, no polyphagia, no polyuria, no visual change, no weakness and no weight loss. There are no hypoglycemic complications. Symptoms are stable. Diabetic complications include heart disease, peripheral neuropathy and PVD. When asked about current treatments, none were reported. His weight is increasing steadily. He is following a generally healthy diet. Meal planning includes avoidance of concentrated sweets. He has not had a previous visit with a dietician. He never participates in exercise. There is no change in his home blood glucose trend. An ACE inhibitor/angiotensin II receptor blocker is being taken. He sees a podiatrist.Eye exam is not current.      Review of Systems  Constitutional: Negative.  Negative for fever, chills, weight loss, diaphoresis, appetite change and fatigue.  HENT: Negative.   Eyes: Negative.  Negative for blurred vision and visual disturbance.  Respiratory: Negative.  Negative for cough, chest tightness, shortness of breath, wheezing and stridor.   Cardiovascular: Negative.  Negative for chest pain, palpitations and leg swelling.  Gastrointestinal: Negative.  Negative for nausea, vomiting, abdominal pain, diarrhea and constipation.  Endocrine: Negative.  Negative for polydipsia, polyphagia and polyuria.  Genitourinary: Negative.   Musculoskeletal: Negative.  Negative for myalgias, back pain, joint swelling, arthralgias and gait problem.  Skin: Negative.    Allergic/Immunologic: Negative.   Neurological: Negative.  Negative for dizziness and weakness.  Hematological: Negative.  Negative for adenopathy. Does not bruise/bleed easily.  Psychiatric/Behavioral: Negative.        Objective:   Physical Exam  Vitals reviewed. Constitutional: He is oriented to person, place, and time. He appears well-developed and well-nourished. No distress.  HENT:  Head: Normocephalic and atraumatic.  Mouth/Throat: Oropharynx is clear and moist. No oropharyngeal exudate.  Eyes: Conjunctivae are normal. Right eye exhibits no discharge. Left eye exhibits no discharge. No scleral icterus.  Neck: Normal range of motion. Neck supple. No JVD present. No tracheal deviation present. No thyromegaly present.  Cardiovascular: Normal rate, regular rhythm, normal heart sounds and intact distal pulses.  Exam reveals no gallop and no friction rub.   No murmur heard. Pulmonary/Chest: Effort normal and breath sounds normal. No stridor. No respiratory distress. He has no wheezes. He has no rales. He exhibits no tenderness.  Abdominal: Soft. Bowel sounds are normal. He exhibits no distension and no mass. There is no tenderness. There is no rebound and no guarding.  Musculoskeletal: Normal range of motion. He exhibits no edema and no tenderness.  Lymphadenopathy:    He has no cervical adenopathy.  Neurological: He is oriented to person, place, and time.  Skin: Skin is warm and dry. No rash noted. He is not diaphoretic. No erythema. No pallor.  Psychiatric: He has a normal mood and affect. His behavior is normal. Judgment and thought content normal.      Lab Results  Component Value Date   WBC 9.1 09/09/2012   HGB 14.8 09/09/2012   HCT 44.1 09/09/2012   PLT 234.0 09/09/2012   GLUCOSE 121* 09/09/2012   CHOL 154 08/24/2012   TRIG 120.0 08/24/2012   HDL 51.10  08/24/2012   LDLCALC 79 08/24/2012   ALT 28 08/24/2012   AST 24 08/24/2012   NA 139 09/09/2012   K 4.4 09/09/2012   CL 106  09/09/2012   CREATININE 1.1 09/09/2012   BUN 13 09/09/2012   CO2 24 09/09/2012   TSH 0.65 08/24/2012   PSA 3.05 12/14/2010   INR 1.0 09/09/2012   HGBA1C 6.8* 08/24/2012      Assessment & Plan:

## 2012-10-06 ENCOUNTER — Ambulatory Visit (INDEPENDENT_AMBULATORY_CARE_PROVIDER_SITE_OTHER): Payer: Medicare Other | Admitting: Physician Assistant

## 2012-10-06 ENCOUNTER — Encounter: Payer: Self-pay | Admitting: Physician Assistant

## 2012-10-06 VITALS — BP 145/67 | HR 55 | Ht 72.0 in | Wt 226.0 lb

## 2012-10-06 DIAGNOSIS — E785 Hyperlipidemia, unspecified: Secondary | ICD-10-CM

## 2012-10-06 DIAGNOSIS — I2510 Atherosclerotic heart disease of native coronary artery without angina pectoris: Secondary | ICD-10-CM

## 2012-10-06 DIAGNOSIS — I10 Essential (primary) hypertension: Secondary | ICD-10-CM

## 2012-10-06 NOTE — Patient Instructions (Addendum)
Your physician recommends that you continue on your current medications as directed. Please refer to the Current Medication list given to you today.  Your physician recommends that you schedule a follow-up appointment in: [facility] IN 3 MONTHS

## 2012-10-06 NOTE — Progress Notes (Signed)
[address] [place], [place]  [address] Phone: [phone] Fax:  [phone]  Date:  10/06/2012   ID:  Alfred Armstrong, DOB 02/07/1931, MRN [id number]  PCP:  Sanda Linger, MD  Cardiologist:  Dr. [name]     History of Present Illness: Alfred Armstrong is a 79 y.o. male who returns for f/u after a recent heart cath.    He has a hx of CAD s/p CABG, carotid stenosis, and COPD.  He had a myoview suggesting ischemia in 5/10 followed by catheterization showing patent LIMA and SVG-PLV. There was a chronic occlusion of the free radial to diagonal graft. There was no evidence by catheterization for lesion causing significant ischemia. He sees pulmonology for COPD. Echo in 7/11 showed preserved LV systolic function with basal inferoposterior akinesis. Most recently, he had right CEA in 6/14.   He was seen by Dr. [name] recently with increased exertional dyspnea and occasional chest tightness of short duration. He was set up for echocardiogram and stress testing. Echo 09/02/12: Mild LVH, EF 50-55%, grade 1 diastolic dysfunction, mild aortic root dilatation, trivial MR, mild LAE, PASP 31.  Lexiscan Myoview 8/14: Intermediate risk, anterolateral defect suggestive of infarct with significant peri-infarct ischemia and inferior defect consistent with ischemia, EF 49%. Cardiac catheterization was arranged. LHC 09/15/12:  dLM 30, pLAD 80, mLAD 90, L-LAD ok with dLAD 40 after TD of graft, oRadial-D1 occluded (old), mCFX 50, mRCA occluded, S-PLV ok, PDA filled by collats.  There were no changes from 2010. There is a potential for ischemia from the diagonal or PDA territory. These were not amenable to PCI. Medical therapy recommended.  Since his LHC, he is doing well.  Wants to return to the gym and start working out again.  No further CP.  No syncope.  No orthopnea, PND.  He has mild pedal edema.    Labs (1/11): BNP 57, TSH normal, LDL 98, HDL 49  Labs (12/12): K 4.4, creatinine 1.05    Labs (1/13): LDL 48, HDL 51  Labs (3/13): K 4.5, creatinine 1.1  Labs (10/13): K 4.1, creatinine 1.0, LDL 65, HDL 53  Labs (7/14): LDL 79, HDL 51, K 4.4, creatinine 1.1, BNP 116  Labs (8/14):  K 4.4, Cr 1.1, Hgb 14.8,   Wt Readings from Last 3 Encounters:  10/06/12 226 lb (102.513 kg)  09/23/12 232 lb (105.235 kg)  09/15/12 230 lb (104.327 kg)     Past Medical History:  1. CAD, ARTERY BYPASS GRAFT: s/p CABG in [place] in 1989. Had myoview in 5/10 showing EF 50% and apical ischemia. LHC was done in 5/10 showing 90% pLAD, 90% mLAD, 60% mCFX, 70% pRCA, 70% mRCA, 95% dRCA, SVG-PLV patent, LIMA-LAD patent, no other grafts found by aortic root shot. Free radial-D presumed occluded. Echo (5/11): EF 55%, mild LVH, basal inferoposterior akinesis, normal RV.  Echo 09/02/12: Mild LVH, EF 50-55%, grade 1 diastolic dysfunction, mild aortic root dilatation, trivial MR, mild LAE, PASP 31.  Lexiscan Myoview 8/14: Intermediate risk, anterolateral defect suggestive of infarct with significant peri-infarct ischemia and inferior defect consistent with ischemia, EF 49%. Cardiac catheterization was arranged. LHC 09/15/12:  dLM 30, pLAD 80, mLAD 90, L-LAD ok with dLAD 40 after TD of graft, oRadial-D1 occluded (old), mCFX 50, mRCA occluded, S-PLV ok, PDA filled by collats.  There were no changes from 2010. There is a potential for ischemia from the diagonal or PDA territory. These were not amenable to PCI. Medical therapy recommended.  2. COPD (ICD-496): PFTs 9/10 with FVC 65%, FEV1 57%.  3. OSTEOARTHRITIS (ICD-715.90)  4. NEPHROLITHIASIS, HX OF (ICD-V13.01)  5. GERD (ICD-530.81)  6. Tubulovillous adenomatous colon polyps with HGD 1999  7. Hyperlipidemia  8. Carotid stenosis: Followed by Dr. Edilia Bo. Angiogram 5/10 showed left common carotid to be totally occluded and 50% RICA stenosis. Right CEA 6/14.  9. HTN: ACEI cough  10. CVA  11. Shingles  12. MGUS: Followed by Dr. Gaylyn Rong.  Current Outpatient Prescriptions   Medication Sig Dispense Refill  . acetaminophen (TYLENOL) 500 MG tablet Take 1,000 mg by mouth 2 (two) times daily as needed for pain.      Marland Kitchen aspirin 81 MG EC tablet Take 81 mg by mouth daily.       . budesonide-formoterol (SYMBICORT) 160-4.5 MCG/ACT inhaler Inhale 2 puffs into the lungs 2 (two) times daily.  4 Inhaler  0  . cholecalciferol (VITAMIN D) 1000 UNITS tablet Take 1,000 Units by mouth 2 (two) times daily.      Marland Kitchen losartan (COZAAR) 25 MG tablet Take 2 tablets (50 mg total) by mouth daily.  90 tablet  3  . Multiple Vitamins-Minerals (ONE-A-DAY MENS 50+ ADVANTAGE PO) Take by mouth.      Marland Kitchen NASONEX 50 MCG/ACT nasal spray Use one or two sprays in each nostril daily  17 g  10  . nebivolol (BYSTOLIC) 10 MG tablet Take 1 tablet (10 mg total) by mouth daily before breakfast.      . pregabalin (LYRICA) 50 MG capsule Take 50 mg by mouth 3 (three) times daily as needed (for pain).      . rosuvastatin (CRESTOR) 20 MG tablet Take 20 mg by mouth daily.      . traMADol-acetaminophen (ULTRACET) 37.5-325 MG per tablet Take 1 tablet by mouth every evening.      . zolpidem (AMBIEN) 10 MG tablet Take 1 tablet (10 mg total) by mouth at bedtime as needed for sleep.  30 tablet  5  . [DISCONTINUED] Hypertonic Nasal Wash (SINUS RINSE BOTTLE KIT NA) by Nasal route as directed.         No current facility-administered medications for this visit.    Allergies:    Allergies  Allergen Reactions  . Tiotropium Bromide Monohydrate     Dry mouth  . Daliresp [Roflumilast]     Bad thoughts/dreams    Social History:  The patient  reports that he quit smoking about 19 years ago. His smoking use included Cigarettes. He smoked 0.00 packs per day. He has never used smokeless tobacco. He reports that he does not drink alcohol or use illicit drugs.   ROS:  Please see the history of present illness.   He has recurring problems with hoarseness since his CEA.   All other systems reviewed and negative.   PHYSICAL  EXAM: VS:  BP 145/67  Pulse 55  Ht 6' (1.829 m)  Wt 226 lb (102.513 kg)  BMI 30.64 kg/m2 Well nourished, well developed, in no acute distress HEENT: normal Neck: no JVD Cardiac:  normal S1, S2; RRR; no murmur Lungs:  Decreased breath sounds bilaterally, no wheezing, rhonchi or rales Abd: soft, nontender, no hepatomegaly Ext: no edema; right groin without hematoma or bruit  Skin: warm and dry Neuro:  CNs 2-12 intact, no focal abnormalities noted  EKG:  Sinus bradycardia, HR 55, normal axis, nonspecific ST-T wave changes     ASSESSMENT AND PLAN:  1. CAD:  Recent LHC with stable anatomy.  Med Rx recommended.  Continue ASA, statin, beta blocker.  We discussed added Nitrates.  He prefers to hold off but will call if symptoms become limiting as he increases his activity.   2. Hyperlipidemia:  Continue statin. 3. Hypertension:  Fair control.  ARB recently increased by PCP. 4. Carotid Stenosis:  Continue follow up with [facility]. 5. Disposition:  F/u with Dr. [name] in 3 mos.   Signed, Tereso Newcomer, PA-C  10/06/2012 2:27 PM

## 2012-10-19 ENCOUNTER — Encounter: Payer: Self-pay | Admitting: Internal Medicine

## 2012-10-21 ENCOUNTER — Telehealth: Payer: Self-pay

## 2012-10-21 MED ORDER — TRAMADOL-ACETAMINOPHEN 37.5-325 MG PO TABS: 1.0000 | ORAL_TABLET | Freq: Four times a day (QID) | ORAL | Status: DC | PRN

## 2012-10-21 NOTE — Telephone Encounter (Signed)
Received faxed refill request for tramadol/apap 37.5-3 mg 1 tab QID #100. Please advise

## 2012-10-21 NOTE — Telephone Encounter (Signed)
yes

## 2012-11-24 ENCOUNTER — Ambulatory Visit (INDEPENDENT_AMBULATORY_CARE_PROVIDER_SITE_OTHER): Payer: Medicare Other

## 2012-11-24 DIAGNOSIS — Z23 Encounter for immunization: Secondary | ICD-10-CM

## 2012-12-03 ENCOUNTER — Other Ambulatory Visit: Payer: Self-pay

## 2012-12-09 ENCOUNTER — Other Ambulatory Visit: Payer: Self-pay | Admitting: Vascular Surgery

## 2012-12-09 DIAGNOSIS — Z48812 Encounter for surgical aftercare following surgery on the circulatory system: Secondary | ICD-10-CM

## 2013-01-05 ENCOUNTER — Encounter: Payer: Self-pay | Admitting: Vascular Surgery

## 2013-01-06 ENCOUNTER — Ambulatory Visit (INDEPENDENT_AMBULATORY_CARE_PROVIDER_SITE_OTHER): Payer: Medicare Other | Admitting: Vascular Surgery

## 2013-01-06 ENCOUNTER — Ambulatory Visit (HOSPITAL_COMMUNITY)
Admission: RE | Admit: 2013-01-06 | Discharge: 2013-01-06 | Disposition: A | Discharge status: Home / Self Care | Payer: Medicare Other | Source: Ambulatory Visit | Attending: Vascular Surgery | Admitting: Vascular Surgery

## 2013-01-06 ENCOUNTER — Other Ambulatory Visit (HOSPITAL_COMMUNITY): Payer: Medicare Other

## 2013-01-06 ENCOUNTER — Encounter: Payer: Self-pay | Admitting: Vascular Surgery

## 2013-01-06 DIAGNOSIS — I6529 Occlusion and stenosis of unspecified carotid artery: Secondary | ICD-10-CM | POA: Insufficient documentation

## 2013-01-06 DIAGNOSIS — I658 Occlusion and stenosis of other precerebral arteries: Secondary | ICD-10-CM | POA: Insufficient documentation

## 2013-01-06 DIAGNOSIS — Z48812 Encounter for surgical aftercare following surgery on the circulatory system: Secondary | ICD-10-CM | POA: Insufficient documentation

## 2013-01-06 NOTE — Progress Notes (Signed)
[facility] [place]  Patient name: KENDALE REMBOLD MRN: [id number] DOB: 06/23/1931 Sex: male  REASON FOR VISIT: follow up after right carotid endarterectomy.  HPI: Alfred Armstrong is a 79 y.o. male who underwent a right carotid endarterectomy in May of 2014. He returns for his 6 month follow up visit. He has a known left internal carotid artery occlusion. Since I saw him last, he denies any history of stroke, TIAs, expressive or receptive aphasia, or amaurosis fugax. He is on aspirin. He is not a smoker.    Past Medical History  Diagnosis Date  . COPD (chronic obstructive pulmonary disease)   . Osteoarthrosis, unspecified whether generalized or localized, unspecified site   . History of nephrolithiasis   . GERD (gastroesophageal reflux disease)   . Tubulovillous adenoma polyp of colon     w/HGD 1999  . Hyperlipidemia   . Carotid stenosis     Followed by Dr Madilyn Fireman - Angiogram 5/10 showed left common carotid to be totally occluded and 50% RICA stenosis  . HTN (hypertension)     ACEI Cough  . CVA (cerebral infarction)   . Shingles   . MGUS (monoclonal gammopathy of unknown significance) 06/17/2011  . CAD (coronary artery disease)     Dr. Shirlee Latch  . Anxiety     related to hosp. enviromental   . Shortness of breath     uses symbicort daily, when taking the garbage   . H/O hiatal hernia   . Headache(784.0)     annoyance related to enviromental allergies   . Neuromuscular disorder     neuropathy   Family History  Problem Relation Age of Onset  . Alcohol abuse Mother   . Alcohol abuse Father   . Colon cancer Neg Hx    SOCIAL HISTORY: History  Substance Use Topics  . Smoking status: Former Smoker    Types: Cigarettes    Quit date: 01/28/1993  . Smokeless tobacco: Never Used  . Alcohol Use: No     Comment: stopped drinking many yrs. ago    Allergies  Allergen Reactions  . Tiotropium Bromide Monohydrate     Dry mouth  . Daliresp [Roflumilast]    Bad thoughts/dreams   Current Outpatient Prescriptions  Medication Sig Dispense Refill  . acetaminophen (TYLENOL) 500 MG tablet Take 1,000 mg by mouth 2 (two) times daily as needed for pain.      Marland Kitchen aspirin 81 MG EC tablet Take 81 mg by mouth daily.       . budesonide-formoterol (SYMBICORT) 160-4.5 MCG/ACT inhaler Inhale 2 puffs into the lungs 2 (two) times daily.  4 Inhaler  0  . cholecalciferol (VITAMIN D) 1000 UNITS tablet Take 1,000 Units by mouth 2 (two) times daily.      Marland Kitchen losartan (COZAAR) 25 MG tablet Take 2 tablets (50 mg total) by mouth daily.  90 tablet  3  . Multiple Vitamins-Minerals (ONE-A-DAY MENS 50+ ADVANTAGE PO) Take by mouth.      Marland Kitchen NASONEX 50 MCG/ACT nasal spray Use one or two sprays in each nostril daily  17 g  10  . nebivolol (BYSTOLIC) 10 MG tablet Take 1 tablet (10 mg total) by mouth daily before breakfast.      . pregabalin (LYRICA) 50 MG capsule Take 50 mg by mouth 3 (three) times daily as needed (for pain).      . rosuvastatin (CRESTOR) 20 MG tablet Take 20 mg by mouth daily.      . traMADol-acetaminophen (  ULTRACET) 37.5-325 MG per tablet Take 1 tablet by mouth 4 (four) times daily as needed for pain.  100 tablet  4  . zolpidem (AMBIEN) 10 MG tablet Take 1 tablet (10 mg total) by mouth at bedtime as needed for sleep.  30 tablet  5  . [DISCONTINUED] Hypertonic Nasal Wash (SINUS RINSE BOTTLE KIT NA) by Nasal route as directed.         No current facility-administered medications for this visit.   REVIEW OF SYSTEMS: Arly.Keller ] denotes positive finding; [  ] denotes negative finding  CARDIOVASCULAR:  [ ]  chest pain   [ ]  chest pressure   [ ]  palpitations   [ ]  orthopnea   Arly.Keller ] dyspnea on exertion   [ ]  claudication   [ ]  rest pain   [ ]  DVT   [ ]  phlebitis PULMONARY:   [ ]  productive cough   [ ]  asthma   [ ]  wheezing NEUROLOGIC:   [ ]  weakness  [ ]  paresthesias  [ ]  aphasia  [ ]  amaurosis  [ ]  dizziness HEMATOLOGIC:   [ ]  bleeding problems   [ ]  clotting  disorders MUSCULOSKELETAL:  [ ]  joint pain   [ ]  joint swelling [ ]  leg swelling GASTROINTESTINAL: [ ]   blood in stool  [ ]   hematemesis GENITOURINARY:  [ ]   dysuria  [ ]   hematuria PSYCHIATRIC:  [ ]  history of major depression INTEGUMENTARY:  [ ]  rashes  [ ]  ulcers CONSTITUTIONAL:  [ ]  fever   [ ]  chills  PHYSICAL EXAM: Filed Vitals:   01/06/13 1257 01/06/13 1300  BP: 122/70 115/61  Pulse: 55   Height: 6' (1.829 m)   Weight: 232 lb (105.235 kg)   SpO2: 95%    Body mass index is 31.46 kg/(m^2). GENERAL: The patient is a well-nourished male, in no acute distress. The vital signs are documented above. CARDIOVASCULAR: There is a regular rate and rhythm. I do not detect carotid bruits. PULMONARY: There is good air exchange bilaterally without wheezing or rales. ABDOMEN: Soft and non-tender with normal pitched bowel sounds.  MUSCULOSKELETAL: There are no major deformities or cyanosis. NEUROLOGIC: No focal weakness or paresthesias are detected. SKIN: There are no ulcers or rashes noted. PSYCHIATRIC: The patient has a normal affect.  DATA:  I have independently interpreted his carotid duplex scan. This shows a moderate recurrent stenosis on the right carotid in the 60-79% range. Of note he has a left carotid occlusion in the velocities on the right may be falsely elevated. Both vertebral arteries are patent with antegrade flow.  MEDICAL ISSUES:  Occlusion and stenosis of carotid artery without mention of cerebral infarction This patient is status post right carotid endarterectomy in May of this year. He has a moderate recurrent stenosis on the right is likely secondary to intimal hyperplasia. I recommended a follow carotid duplex scan in 6 months and I'll see him back at that time. He is not a smoker. He is on aspirin. We discussed the importance of exercise nutrition also. He knows to call sooner if he has problems.   DICKSON,CHRISTOPHER S [facility]  [place] [facility]: [phone]

## 2013-01-06 NOTE — Assessment & Plan Note (Signed)
This patient is status post right carotid endarterectomy in May of this year. He has a moderate recurrent stenosis on the right is likely secondary to intimal hyperplasia. I recommended a follow carotid duplex scan in 6 months and I'll see him back at that time. He is not a smoker. He is on aspirin. We discussed the importance of exercise nutrition also. He knows to call sooner if he has problems.

## 2013-01-06 NOTE — Addendum Note (Signed)
Addended by: Sharee Pimple on: 01/06/2013 02:16 PM   Modules accepted: Orders

## 2013-01-07 ENCOUNTER — Encounter: Payer: Self-pay | Admitting: *Deleted

## 2013-01-13 ENCOUNTER — Encounter: Payer: Self-pay | Admitting: Cardiology

## 2013-01-13 ENCOUNTER — Ambulatory Visit (INDEPENDENT_AMBULATORY_CARE_PROVIDER_SITE_OTHER): Payer: Medicare Other | Admitting: Cardiology

## 2013-01-13 VITALS — BP 118/68 | HR 64 | Ht 72.0 in | Wt 237.0 lb

## 2013-01-13 DIAGNOSIS — I2510 Atherosclerotic heart disease of native coronary artery without angina pectoris: Secondary | ICD-10-CM

## 2013-01-13 DIAGNOSIS — E785 Hyperlipidemia, unspecified: Secondary | ICD-10-CM

## 2013-01-13 NOTE — Patient Instructions (Signed)
Your physician wants you to follow-up in: 6 months with Dr McLean. (June 2015).  You will receive a reminder letter in the mail two months in advance. If you don't receive a letter, please call our office to schedule the follow-up appointment.  

## 2013-01-13 NOTE — Progress Notes (Signed)
Patient ID: Alfred Armstrong, male   DOB: 02-Jan-1932, 79 y.o.   MRN: [id number] PCP: Dr. Yetta Barre  79 yo with history of CAD s/p CABG, carotid stenosis, and COPD presents for cardiology followup.  He had a myoview suggesting ischemia in 5/10 followed by catheterization showing patent LIMA and SVG-PLV. There was a chronic occlusion of the free radial to diagonal graft. There was no evidence by catheterization for lesion causing significant ischemia. He sees pulmonology for COPD. Echo in 7/11 showed preserved LV systolic function with basal inferoposterior akinesis.  He had right CEA in 6/14.  Given increased exertional dyspnea, I had him do a Cardiolite, which showed a medium-sized anterolateral partially reversible defect.  Therefore, LHC was done again, showing stable anatomy compared to prior cath.   Symptoms are stable.  He is short of breath pulling his garbage cans down to the street.  He can walk on flat ground without problem.  He is not exercising as much as in the past and weight is up.  No chest pain.  No orthopnea or PND.  He now has diet-controlled diabetes.   Labs (1/11): BNP 57, TSH normal, LDL 98, HDL 49  Labs (12/12): K 4.4, creatinine 1.05 Labs (1/13): LDL 48, HDL 51 Labs (3/13): K 4.5, creatinine 1.1 Labs (10/13): K 4.1, creatinine 1.0, LDL 65, HDL 53 Labs (7/14): LDL 79, HDL 51, K 4.4, creatinine 1.1, BNP 116 Labs (8/14): K 4.4, creatinine 1.1  Allergies (verified):  No Known Drug Allergies   Past Medical History:  1. CAD, ARTERY BYPASS GRAFT: s/p CABG in [place] in 1989. Had myoview in 5/10 showing EF 50% and apical ischemia. LHC was done in 5/10 showing 90% pLAD, 90% mLAD, 60% mCFX, 70% pRCA, 70% mRCA, 95% dRCA, SVG-PLV patent, LIMA-LAD patent, no other grafts found by aortic root shot. Free radial-D presumed occluded.  Echo (5/11): EF 55%, mild LVH, basal inferoposterior akinesis, normal RV.  Cardiolite (8/14) with EF 49% and partially reversible anterolateral defect  suggestive of infarction with peri-infarct ischemia. LHC (8/14) with patent LIMA-LAD and SVG-PLV, while radial-D1 was occluded (known from prior), TO mRCA with PDA filling by collaterals. 2. COPD (ICD-496): PFTs 9/10 with FVC 65%, FEV1 57%. 3. OSTEOARTHRITIS (ICD-715.90)  4. NEPHROLITHIASIS, HX OF (ICD-V13.01)  5. GERD (ICD-530.81)  6. Tubulovillous adenomatous colon polyps with HGD 1999  7. Hyperlipidemia  8. Carotid stenosis: Followed by Dr. Edilia Bo. Angiogram 5/10 showed left common carotid to be totally occluded and 50% RICA stenosis.  Right CEA 6/14.  9. HTN: ACEI cough  10. CVA  11. Shingles  12. MGUS: Followed by Dr. Gaylyn Rong.  13. Type II diabetes: diet-controlled.   Family History:  father died of a self-inflicted wound age 71, history of EtOH  mother died age 104 history of EtOH  One sister is well   Social History:   Retired  Single  former [employer]nel [employer] company in [employer]  Regular exercise-yes  Alcohol use-no  Quit smoking around 20 years ago.  Alcohol Use - no  Daily Caffeine Use: 2-3 cups weekly   ROS: All systems reviewed and negative except as per HPI.    Current Outpatient Prescriptions  Medication Sig Dispense Refill  . acetaminophen (TYLENOL) 500 MG tablet Take 1,000 mg by mouth 2 (two) times daily as needed for pain.      Marland Kitchen aspirin 81 MG EC tablet Take 81 mg by mouth daily.       . budesonide-formoterol (SYMBICORT) 160-4.5 MCG/ACT inhaler  Inhale 2 puffs into the lungs 2 (two) times daily.  4 Inhaler  0  . cholecalciferol (VITAMIN D) 1000 UNITS tablet Take 1,000 Units by mouth 2 (two) times daily.      Marland Kitchen losartan (COZAAR) 25 MG tablet Take 2 tablets (50 mg total) by mouth daily.  90 tablet  3  . Multiple Vitamins-Minerals (ONE-A-DAY MENS 50+ ADVANTAGE PO) Take by mouth.      Marland Kitchen NASONEX 50 MCG/ACT nasal spray Use one or two sprays in each nostril daily  17 g  10  . nebivolol (BYSTOLIC) 10 MG tablet Take 1 tablet (10 mg total) by mouth daily before  breakfast.      . pregabalin (LYRICA) 50 MG capsule Take 50 mg by mouth 3 (three) times daily as needed (for pain).      . rosuvastatin (CRESTOR) 20 MG tablet Take 20 mg by mouth daily.      . traMADol-acetaminophen (ULTRACET) 37.5-325 MG per tablet Take 1 tablet by mouth 4 (four) times daily as needed for pain.  100 tablet  4  . zolpidem (AMBIEN) 10 MG tablet Take 1 tablet (10 mg total) by mouth at bedtime as needed for sleep.  30 tablet  5  . [DISCONTINUED] Hypertonic Nasal Wash (SINUS RINSE BOTTLE KIT NA) by Nasal route as directed.         No current facility-administered medications for this visit.   BP 118/68  Pulse 64  Ht 6' (1.829 m)  Wt 107.502 kg (237 lb)  BMI 32.14 kg/m2 General: NAD, overweight Neck: No JVD, no thyromegaly or thyroid nodule.  Lungs: Clear to auscultation bilaterally with normal respiratory effort. CV: Nondisplaced PMI.  Heart regular S1/S2, no S3/S4, no murmur.  Trace left ankle edema (chronic since CABG).  Bilateral carotid bruits.  Normal pedal pulses.  Abdomen: Soft, nontender, no hepatosplenomegaly, no distention.  Neurologic: Alert and oriented x 3.  Psych: Normal affect. Extremities: No clubbing or cyanosis.   Assessment/Plan:  CAD S/p CABG. Last CABG showed patent LIMA-LAD and SVG-PLV while SVG-D1 was occluded (known from prior cath).  Possible sources of ischemia are D1 territory and maybe PDA territory (PDA fills via collaterals).  No chest pain.  - Continue ASA 81, Crestor, losartan, and nebivolol.  - Exertional dyspnea may be mostly due to deconditioning (has not been walking regularly).  CAROTID ARTERY STENOSIS  Has regular followup at [facility] for significant carotid disease. S/p right CEA in 6/14.  HYPERLIPIDEMIA-MIXED  Good LDL when recently checked in 7/14.   Alfred Armstrong 01/13/2013

## 2013-01-25 ENCOUNTER — Encounter: Payer: Self-pay | Admitting: Internal Medicine

## 2013-01-25 ENCOUNTER — Ambulatory Visit (INDEPENDENT_AMBULATORY_CARE_PROVIDER_SITE_OTHER): Payer: Medicare Other | Admitting: Internal Medicine

## 2013-01-25 ENCOUNTER — Other Ambulatory Visit (INDEPENDENT_AMBULATORY_CARE_PROVIDER_SITE_OTHER): Payer: Medicare Other

## 2013-01-25 VITALS — BP 120/70 | HR 55 | Temp 97.7°F | Resp 16 | Wt 231.0 lb

## 2013-01-25 DIAGNOSIS — I10 Essential (primary) hypertension: Secondary | ICD-10-CM

## 2013-01-25 DIAGNOSIS — E1149 Type 2 diabetes mellitus with other diabetic neurological complication: Secondary | ICD-10-CM

## 2013-01-25 LAB — BASIC METABOLIC PANEL
BUN: 20 mg/dL (ref 6–23)
CO2: 26 mEq/L (ref 19–32)
Chloride: 107 mEq/L (ref 96–112)
Creatinine, Ser: 1.1 mg/dL (ref 0.4–1.5)
GFR: 68.6 mL/min (ref >60.00)
Glucose, Bld: 130 mg/dL — ABNORMAL HIGH (ref 70–99)
Potassium: 4.4 mEq/L (ref 3.5–5.1)

## 2013-01-25 LAB — HEMOGLOBIN A1C: Hgb A1c MFr Bld: 6.9 % — ABNORMAL HIGH (ref 4.6–6.5)

## 2013-01-25 NOTE — Patient Instructions (Signed)
Type 2 Diabetes Mellitus, Adult Type 2 diabetes mellitus, often simply referred to as type 2 diabetes, is a long-lasting (chronic) disease. In type 2 diabetes, the pancreas does not make enough insulin (a hormone), the cells are less responsive to the insulin that is made (insulin resistance), or both. Normally, insulin moves sugars from food into the tissue cells. The tissue cells use the sugars for energy. The lack of insulin or the lack of normal response to insulin causes excess sugars to build up in the blood instead of going into the tissue cells. As a result, high blood sugar (hyperglycemia) develops. The effect of high sugar (glucose) levels can cause many complications. Type 2 diabetes was also previously called adult-onset diabetes but it can occur at any age.  RISK FACTORS  A person is predisposed to developing type 2 diabetes if someone in the family has the disease and also has one or more of the following primary risk factors:  Overweight.  An inactive lifestyle.  A history of consistently eating high-calorie foods. Maintaining a normal weight and regular physical activity can reduce the chance of developing type 2 diabetes. SYMPTOMS  A person with type 2 diabetes may not show symptoms initially. The symptoms of type 2 diabetes appear slowly. The symptoms include:  Increased thirst (polydipsia).  Increased urination (polyuria).  Increased urination during the night (nocturia).  Weight loss. This weight loss may be rapid.  Frequent, recurring infections.  Tiredness (fatigue).  Weakness.  Vision changes, such as blurred vision.  Fruity smell to your breath.  Abdominal pain.  Nausea or vomiting.  Cuts or bruises which are slow to heal.  Tingling or numbness in the hands or feet. DIAGNOSIS Type 2 diabetes is frequently not diagnosed until complications of diabetes are present. Type 2 diabetes is diagnosed when symptoms or complications are present and when blood  glucose levels are increased. Your blood glucose level may be checked by one or more of the following blood tests:  A fasting blood glucose test. You will not be allowed to eat for at least 8 hours before a blood sample is taken.  A random blood glucose test. Your blood glucose is checked at any time of the day regardless of when you ate.  A hemoglobin A1c blood glucose test. A hemoglobin A1c test provides information about blood glucose control over the previous 3 months.  An oral glucose tolerance test (OGTT). Your blood glucose is measured after you have not eaten (fasted) for 2 hours and then after you drink a glucose-containing beverage. TREATMENT   You may need to take insulin or diabetes medicine daily to keep blood glucose levels in the desired range.  You will need to match insulin dosing with exercise and healthy food choices. The treatment goal is to maintain the before meal blood sugar (preprandial glucose) level at 70 130 mg/dL. HOME CARE INSTRUCTIONS   Have your hemoglobin A1c level checked twice a year.  Perform daily blood glucose monitoring as directed by your caregiver.  Monitor urine ketones when you are ill and as directed by your caregiver.  Take your diabetes medicine or insulin as directed by your caregiver to maintain your blood glucose levels in the desired range.  Never run out of diabetes medicine or insulin. It is needed every day.  Adjust insulin based on your intake of carbohydrates. Carbohydrates can raise blood glucose levels but need to be included in your diet. Carbohydrates provide vitamins, minerals, and fiber which are an essential part of   a healthy diet. Carbohydrates are found in fruits, vegetables, whole grains, dairy products, legumes, and foods containing added sugars.    Eat healthy foods. Alternate 3 meals with 3 snacks.  Lose weight if overweight.  Carry a medical alert card or wear your medical alert jewelry.  Carry a 15 gram  carbohydrate snack with you at all times to treat low blood glucose (hypoglycemia). Some examples of 15 gram carbohydrate snacks include:  Glucose tablets, 3 or 4   Glucose gel, 15 gram tube  Raisins, 2 tablespoons (24 grams)  Jelly beans, 6  Animal crackers, 8  Regular pop, 4 ounces (120 mL)  Gummy treats, 9  Recognize hypoglycemia. Hypoglycemia occurs with blood glucose levels of 70 mg/dL and below. The risk for hypoglycemia increases when fasting or skipping meals, during or after intense exercise, and during sleep. Hypoglycemia symptoms can include:  Tremors or shakes.  Decreased ability to concentrate.  Sweating.  Increased heart rate.  Headache.  Dry mouth.  Hunger.  Irritability.  Anxiety.  Restless sleep.  Altered speech or coordination.  Confusion.  Treat hypoglycemia promptly. If you are alert and able to safely swallow, follow the 15:15 rule:  Take 15 20 grams of rapid-acting glucose or carbohydrate. Rapid-acting options include glucose gel, glucose tablets, or 4 ounces (120 mL) of fruit juice, regular soda, or low fat milk.  Check your blood glucose level 15 minutes after taking the glucose.  Take 15 20 grams more of glucose if the repeat blood glucose level is still 70 mg/dL or below.  Eat a meal or snack within 1 hour once blood glucose levels return to normal.    Be alert to polyuria and polydipsia which are early signs of hyperglycemia. An early awareness of hyperglycemia allows for prompt treatment. Treat hyperglycemia as directed by your caregiver.  Engage in at least 150 minutes of moderate-intensity physical activity a week, spread over at least 3 days of the week or as directed by your caregiver. In addition, you should engage in resistance exercise at least 2 times a week or as directed by your caregiver.  Adjust your medicine and food intake as needed if you start a new exercise or sport.  Follow your sick day plan at any time you  are unable to eat or drink as usual.  Avoid tobacco use.  Limit alcohol intake to no more than 1 drink per day for nonpregnant women and 2 drinks per day for men. You should drink alcohol only when you are also eating food. Talk with your caregiver whether alcohol is safe for you. Tell your caregiver if you drink alcohol several times a week.  Follow up with your caregiver regularly.  Schedule an eye exam soon after the diagnosis of type 2 diabetes and then annually.  Perform daily skin and foot care. Examine your skin and feet daily for cuts, bruises, redness, nail problems, bleeding, blisters, or sores. A foot exam by a caregiver should be done annually.  Brush your teeth and gums at least twice a day and floss at least once a day. Follow up with your dentist regularly.  Share your diabetes management plan with your workplace or school.  Stay up-to-date with immunizations.  Learn to manage stress.  Obtain ongoing diabetes education and support as needed.  Participate in, or seek rehabilitation as needed to maintain or improve independence and quality of life. Request a physical or occupational therapy referral if you are having foot or hand numbness or difficulties with grooming,   dressing, eating, or physical activity. SEEK MEDICAL CARE IF:   You are unable to eat food or drink fluids for more than 6 hours.  You have nausea and vomiting for more than 6 hours.  Your blood glucose level is over 240 mg/dL.  There is a change in mental status.  You develop an additional serious illness.  You have diarrhea for more than 6 hours.  You have been sick or have had a fever for a couple of days and are not getting better.  You have pain during any physical activity.  SEEK IMMEDIATE MEDICAL CARE IF:  You have difficulty breathing.  You have moderate to large ketone levels. MAKE SURE YOU:  Understand these instructions.  Will watch your condition.  Will get help right away if  you are not doing well or get worse. Document Released: 01/14/2005 Document Revised: 10/09/2011 Document Reviewed: 08/13/2011 [employer] Patient Information 2014 [employer].  

## 2013-01-25 NOTE — Progress Notes (Signed)
Pre visit review using our clinic review tool, if applicable. No additional management support is needed unless otherwise documented below in the visit note. 

## 2013-01-25 NOTE — Progress Notes (Signed)
Subjective:    Patient ID: Alfred Armstrong, male    DOB: 12/28/1931, 79 y.o.   MRN: [id number]  Diabetes He presents for his follow-up diabetic visit. He has type 2 diabetes mellitus. His disease course has been stable. There are no hypoglycemic associated symptoms. Pertinent negatives for hypoglycemia include no dizziness or headaches. Associated symptoms include foot paresthesias. Pertinent negatives for diabetes include no blurred vision, no chest pain, no fatigue, no foot ulcerations, no polydipsia, no polyphagia, no polyuria, no visual change, no weakness and no weight loss. There are no hypoglycemic complications. Diabetic complications include heart disease and peripheral neuropathy. Current diabetic treatment includes diet. He is compliant with treatment some of the time. His weight is stable. He is following a generally healthy diet. Meal planning includes avoidance of concentrated sweets. He has not had a previous visit with a dietician. He participates in exercise intermittently. There is no change in his home blood glucose trend. An ACE inhibitor/angiotensin II receptor blocker is being taken. He sees a podiatrist.Eye exam is current.      Review of Systems  Constitutional: Negative.  Negative for fever, chills, weight loss, diaphoresis, appetite change and fatigue.  HENT: Negative.   Eyes: Negative.  Negative for blurred vision.  Respiratory: Negative.  Negative for cough, choking, shortness of breath and stridor.   Cardiovascular: Negative.  Negative for chest pain, palpitations and leg swelling.  Gastrointestinal: Negative.  Negative for nausea, vomiting, abdominal pain, diarrhea and constipation.  Endocrine: Negative.  Negative for polydipsia, polyphagia and polyuria.  Genitourinary: Negative.   Musculoskeletal: Negative.  Negative for arthralgias, back pain, myalgias, neck pain and neck stiffness.  Skin: Negative.   Allergic/Immunologic: Negative.   Neurological: Negative.   Negative for dizziness, weakness, light-headedness and headaches.  Hematological: Negative.  Negative for adenopathy. Does not bruise/bleed easily.  Psychiatric/Behavioral: Negative.        Objective:   Physical Exam  Vitals reviewed. Constitutional: He is oriented to person, place, and time. He appears well-developed and well-nourished. No distress.  HENT:  Head: Normocephalic and atraumatic.  Mouth/Throat: Oropharynx is clear and moist. No oropharyngeal exudate.  Eyes: Conjunctivae are normal. Right eye exhibits no discharge. Left eye exhibits no discharge. No scleral icterus.  Neck: Normal range of motion. Neck supple. No JVD present. No tracheal deviation present. No thyromegaly present.  Cardiovascular: Normal rate, regular rhythm, normal heart sounds and intact distal pulses.  Exam reveals no gallop and no friction rub.   No murmur heard. Pulmonary/Chest: Effort normal and breath sounds normal. No stridor. No respiratory distress. He has no wheezes. He has no rales. He exhibits no tenderness.  Abdominal: Soft. Bowel sounds are normal. He exhibits no distension and no mass. There is no tenderness. There is no rebound and no guarding.  Musculoskeletal: Normal range of motion. He exhibits no edema and no tenderness.  Lymphadenopathy:    He has no cervical adenopathy.  Neurological: He is oriented to person, place, and time.  Skin: Skin is warm and dry. No rash noted. He is not diaphoretic. No erythema. No pallor.  Psychiatric: He has a normal mood and affect. His behavior is normal. Judgment and thought content normal.     Lab Results  Component Value Date   WBC 9.1 09/09/2012   HGB 14.8 09/09/2012   HCT 44.1 09/09/2012   PLT 234.0 09/09/2012   GLUCOSE 121* 09/09/2012   CHOL 154 08/24/2012   TRIG 120.0 08/24/2012   HDL 51.10 08/24/2012   LDLCALC 79  08/24/2012   ALT 28 08/24/2012   AST 24 08/24/2012   NA 139 09/09/2012   K 4.4 09/09/2012   CL 106 09/09/2012   CREATININE 1.1 09/09/2012     BUN 13 09/09/2012   CO2 24 09/09/2012   TSH 0.65 08/24/2012   PSA 3.05 12/14/2010   INR 1.0 09/09/2012   HGBA1C 6.8* 08/24/2012       Assessment & Plan:

## 2013-01-26 ENCOUNTER — Encounter: Payer: Self-pay | Admitting: Internal Medicine

## 2013-01-26 NOTE — Assessment & Plan Note (Signed)
His BP is well controlled His lytes and renal function are stable 

## 2013-01-26 NOTE — Assessment & Plan Note (Signed)
His A1C continues to be well controlled He will cont to work on his lifestyle modifications

## 2013-02-12 ENCOUNTER — Other Ambulatory Visit: Payer: Self-pay | Admitting: Internal Medicine

## 2013-03-01 ENCOUNTER — Ambulatory Visit (INDEPENDENT_AMBULATORY_CARE_PROVIDER_SITE_OTHER): Payer: Managed Care, Other (non HMO) | Admitting: Internal Medicine

## 2013-03-01 ENCOUNTER — Encounter: Payer: Self-pay | Admitting: Internal Medicine

## 2013-03-01 VITALS — BP 120/80 | HR 59 | Temp 97.5°F | Resp 16 | Ht 72.0 in | Wt 224.0 lb

## 2013-03-01 DIAGNOSIS — J209 Acute bronchitis, unspecified: Secondary | ICD-10-CM

## 2013-03-01 DIAGNOSIS — J449 Chronic obstructive pulmonary disease, unspecified: Secondary | ICD-10-CM

## 2013-03-01 MED ORDER — CEFUROXIME AXETIL 500 MG PO TABS: 500.0000 mg | ORAL_TABLET | Freq: Two times a day (BID) | ORAL | Status: DC

## 2013-03-01 MED ORDER — HYDROCODONE-HOMATROPINE 5-1.5 MG/5ML PO SYRP: 5.0000 mL | ORAL_SOLUTION | Freq: Three times a day (TID) | ORAL | Status: DC | PRN

## 2013-03-01 NOTE — Progress Notes (Signed)
   Subjective:    Patient ID: Alfred Armstrong, male    DOB: 02/24/1931, 80 y.o.   MRN: [id number]  Cough This is a new problem. The current episode started 1 to 4 weeks ago. The problem has been gradually worsening. The problem occurs every few hours. The cough is productive of purulent sputum. Associated symptoms include chills. Pertinent negatives include no chest pain, ear congestion, ear pain, fever, headaches, heartburn, hemoptysis, myalgias, nasal congestion, postnasal drip, rash, rhinorrhea, sore throat, shortness of breath, sweats, weight loss or wheezing. Nothing aggravates the symptoms. He has tried a beta-agonist inhaler, steroid inhaler and OTC cough suppressant for the symptoms. The treatment provided mild relief. His past medical history is significant for COPD.      Review of Systems  Constitutional: Positive for chills. Negative for fever, weight loss, diaphoresis, activity change, appetite change and fatigue.  HENT: Negative.  Negative for ear pain, postnasal drip, rhinorrhea, sore throat and trouble swallowing.   Eyes: Negative.   Respiratory: Positive for cough. Negative for hemoptysis, shortness of breath and wheezing.   Cardiovascular: Negative.  Negative for chest pain, palpitations and leg swelling.  Gastrointestinal: Negative.  Negative for heartburn, nausea, vomiting, abdominal pain, diarrhea and constipation.  Endocrine: Negative.   Genitourinary: Negative.   Musculoskeletal: Positive for arthralgias. Negative for back pain, gait problem, joint swelling, myalgias, neck pain and neck stiffness.  Skin: Negative.  Negative for rash.  Allergic/Immunologic: Negative.   Neurological: Negative.  Negative for headaches.  Hematological: Negative.   Psychiatric/Behavioral: Negative.        Objective:   Physical Exam  Vitals reviewed. Constitutional: He is oriented to person, place, and time. He appears well-developed and well-nourished.  Non-toxic appearance. He does not  have a sickly appearance. He does not appear ill. No distress.  HENT:  Mouth/Throat: Oropharynx is clear and moist. No oropharyngeal exudate.  Eyes: Conjunctivae are normal. Right eye exhibits no discharge. Left eye exhibits no discharge. No scleral icterus.  Neck: Normal range of motion. Neck supple. No JVD present. No tracheal deviation present. No thyromegaly present.  Cardiovascular: Normal rate, regular rhythm, normal heart sounds and intact distal pulses.  Exam reveals no gallop and no friction rub.   No murmur heard. Pulmonary/Chest: Effort normal and breath sounds normal. No stridor. No respiratory distress. He has no wheezes. He has no rales. He exhibits no tenderness.  Abdominal: Soft. Bowel sounds are normal. He exhibits no distension and no mass. There is no tenderness. There is no rebound and no guarding.  Musculoskeletal: Normal range of motion. He exhibits no edema and no tenderness.  Lymphadenopathy:    He has no cervical adenopathy.  Neurological: He is oriented to person, place, and time.  Skin: Skin is warm and dry. No rash noted. He is not diaphoretic. No erythema. No pallor.          Assessment & Plan:

## 2013-03-01 NOTE — Progress Notes (Signed)
Pre visit review using our clinic review tool, if applicable. No additional management support is needed unless otherwise documented below in the visit note. 

## 2013-03-01 NOTE — Patient Instructions (Signed)
Acute Bronchitis Bronchitis is inflammation of the airways that extend from the windpipe into the lungs (bronchi). The inflammation often causes mucus to develop. This leads to a cough, which is the most common symptom of bronchitis.  In acute bronchitis, the condition usually develops suddenly and goes away over time, usually in a couple weeks. Smoking, allergies, and asthma can make bronchitis worse. Repeated episodes of bronchitis may cause further lung problems.  CAUSES Acute bronchitis is most often caused by the same virus that causes a cold. The virus can spread from person to person (contagious).  SIGNS AND SYMPTOMS   Cough.   Fever.   Coughing up mucus.   Body aches.   Chest congestion.   Chills.   Shortness of breath.   Sore throat.  DIAGNOSIS  Acute bronchitis is usually diagnosed through a physical exam. Tests, such as chest X-rays, are sometimes done to rule out other conditions.  TREATMENT  Acute bronchitis usually goes away in a couple weeks. Often times, no medical treatment is necessary. Medicines are sometimes given for relief of fever or cough. Antibiotics are usually not needed but may be prescribed in certain situations. In some cases, an inhaler may be recommended to help reduce shortness of breath and control the cough. A cool mist vaporizer may also be used to help thin bronchial secretions and make it easier to clear the chest.  HOME CARE INSTRUCTIONS  Get plenty of rest.   Drink enough fluids to keep your urine clear or pale yellow (unless you have a medical condition that requires fluid restriction). Increasing fluids may help thin your secretions and will prevent dehydration.   Only take over-the-counter or prescription medicines as directed by your health care provider.   Avoid smoking and secondhand smoke. Exposure to cigarette smoke or irritating chemicals will make bronchitis worse. If you are a smoker, consider using nicotine gum or skin  patches to help control withdrawal symptoms. Quitting smoking will help your lungs heal faster.   Reduce the chances of another bout of acute bronchitis by washing your hands frequently, avoiding people with cold symptoms, and trying not to touch your hands to your mouth, nose, or eyes.   Follow up with your health care provider as directed.  SEEK MEDICAL CARE IF: Your symptoms do not improve after 1 week of treatment.  SEEK IMMEDIATE MEDICAL CARE IF:  You develop an increased fever or chills.   You have chest pain.   You have severe shortness of breath.  You have bloody sputum.   You develop dehydration.  You develop fainting.  You develop repeated vomiting.  You develop a severe headache. MAKE SURE YOU:   Understand these instructions.  Will watch your condition.  Will get help right away if you are not doing well or get worse. Document Released: 02/22/2004 Document Revised: 09/16/2012 Document Reviewed: 07/07/2012 [employer] Patient Information 2014 [employer].  

## 2013-03-02 ENCOUNTER — Telehealth: Payer: Self-pay | Admitting: Internal Medicine

## 2013-03-02 NOTE — Assessment & Plan Note (Signed)
He will take hycodan for the cough and will treat the infection with ceftin

## 2013-03-02 NOTE — Assessment & Plan Note (Signed)
stable °

## 2013-03-02 NOTE — Telephone Encounter (Signed)
Relevant patient education mailed to patient.  

## 2013-04-28 ENCOUNTER — Encounter: Payer: Self-pay | Admitting: Internal Medicine

## 2013-04-28 ENCOUNTER — Ambulatory Visit (INDEPENDENT_AMBULATORY_CARE_PROVIDER_SITE_OTHER): Payer: Managed Care, Other (non HMO) | Admitting: Internal Medicine

## 2013-04-28 ENCOUNTER — Telehealth: Payer: Self-pay | Admitting: *Deleted

## 2013-04-28 ENCOUNTER — Other Ambulatory Visit (INDEPENDENT_AMBULATORY_CARE_PROVIDER_SITE_OTHER): Payer: Managed Care, Other (non HMO)

## 2013-04-28 VITALS — BP 118/68 | HR 68 | Temp 98.8°F | Resp 16 | Ht 72.0 in | Wt 232.0 lb

## 2013-04-28 DIAGNOSIS — L989 Disorder of the skin and subcutaneous tissue, unspecified: Secondary | ICD-10-CM

## 2013-04-28 DIAGNOSIS — E1149 Type 2 diabetes mellitus with other diabetic neurological complication: Secondary | ICD-10-CM

## 2013-04-28 DIAGNOSIS — M1990 Unspecified osteoarthritis, unspecified site: Secondary | ICD-10-CM

## 2013-04-28 DIAGNOSIS — D472 Monoclonal gammopathy: Secondary | ICD-10-CM

## 2013-04-28 DIAGNOSIS — I10 Essential (primary) hypertension: Secondary | ICD-10-CM

## 2013-04-28 LAB — CBC WITH DIFFERENTIAL/PLATELET
BASOS PCT: 0.5 % (ref 0.0–3.0)
Basophils Absolute: 0 10*3/uL (ref 0.0–0.1)
EOS PCT: 1.7 % (ref 0.0–5.0)
Eosinophils Absolute: 0.1 10*3/uL (ref 0.0–0.7)
HEMATOCRIT: 41.9 % (ref 39.0–52.0)
Hemoglobin: 14.1 g/dL (ref 13.0–17.0)
LYMPHS ABS: 2 10*3/uL (ref 0.7–4.0)
Lymphocytes Relative: 24.7 % (ref 12.0–46.0)
MCHC: 33.6 g/dL (ref 30.0–36.0)
MCV: 93.3 fl (ref 78.0–100.0)
Monocytes Absolute: 0.5 10*3/uL (ref 0.1–1.0)
Monocytes Relative: 6.7 % (ref 3.0–12.0)
Neutro Abs: 5.4 10*3/uL (ref 1.4–7.7)
Neutrophils Relative %: 66.4 % (ref 43.0–77.0)
Platelets: 243 10*3/uL (ref 150.0–400.0)
RBC: 4.49 Mil/uL (ref 4.22–5.81)
RDW: 13.8 % (ref 11.5–14.6)
WBC: 8.1 10*3/uL (ref 4.5–10.5)

## 2013-04-28 LAB — BASIC METABOLIC PANEL
BUN: 17 mg/dL (ref 6–23)
CALCIUM: 9.1 mg/dL (ref 8.4–10.5)
CO2: 23 meq/L (ref 19–32)
Chloride: 106 mEq/L (ref 96–112)
Creatinine, Ser: 1.1 mg/dL (ref 0.4–1.5)
GFR: 72.34 mL/min (ref >60.00)
GLUCOSE: 235 mg/dL — AB (ref 70–99)
Potassium: 3.8 mEq/L (ref 3.5–5.1)
SODIUM: 135 meq/L (ref 135–145)

## 2013-04-28 LAB — HEMOGLOBIN A1C: Hgb A1c MFr Bld: 6.7 % — ABNORMAL HIGH (ref 4.6–6.5)

## 2013-04-28 NOTE — Progress Notes (Signed)
Pre visit review using our clinic review tool, if applicable. No additional management support is needed unless otherwise documented below in the visit note. 

## 2013-04-28 NOTE — Assessment & Plan Note (Signed)
His BP is well controlled 

## 2013-04-28 NOTE — Assessment & Plan Note (Signed)
I will check his A1C and will address if needed 

## 2013-04-28 NOTE — Progress Notes (Signed)
Subjective:    Patient ID: Alfred Armstrong, male    DOB: 1931-06-10, 80 y.o.   MRN: [id number]  HPI Comments: He has a growth dorsum of his right hand that has been present for months - it was red but now it feels rough and scaly to him but it does not cause him any symptoms.  Diabetes He presents for his follow-up diabetic visit. He has type 2 diabetes mellitus. His disease course has been stable. There are no hypoglycemic associated symptoms. Pertinent negatives for hypoglycemia include no dizziness, headaches or tremors. Pertinent negatives for diabetes include no blurred vision, no chest pain, no fatigue, no foot paresthesias, no foot ulcerations, no polydipsia, no polyphagia, no polyuria, no visual change, no weakness and no weight loss. There are no hypoglycemic complications. Diabetic complications include heart disease. Current diabetic treatment includes diet. He is compliant with treatment all of the time. His weight is stable. He is following a generally healthy diet. Meal planning includes avoidance of concentrated sweets. He has not had a previous visit with a dietician. There is no change in his home blood glucose trend. An ACE inhibitor/angiotensin II receptor blocker is being taken. He does not see a podiatrist.Eye exam is current.      Review of Systems  Constitutional: Negative.  Negative for fever, chills, weight loss, diaphoresis and fatigue.  HENT: Negative.   Eyes: Negative.  Negative for blurred vision.  Respiratory: Negative.  Negative for cough, choking, chest tightness, shortness of breath, wheezing and stridor.   Cardiovascular: Negative.  Negative for chest pain, palpitations and leg swelling.  Gastrointestinal: Negative.  Negative for nausea, vomiting, abdominal pain, diarrhea, constipation and blood in stool.  Endocrine: Negative.  Negative for polydipsia, polyphagia and polyuria.  Genitourinary: Negative.   Musculoskeletal: Positive for arthralgias (pain in the  knuckles on both hands). Negative for back pain, gait problem, joint swelling, myalgias, neck pain and neck stiffness.  Skin: Negative.   Allergic/Immunologic: Negative.   Neurological: Negative.  Negative for dizziness, tremors, facial asymmetry, weakness, light-headedness, numbness and headaches.  Hematological: Negative.  Negative for adenopathy. Does not bruise/bleed easily.  Psychiatric/Behavioral: Negative.        Objective:   Physical Exam  Vitals reviewed. Constitutional: He appears well-developed and well-nourished. No distress.  HENT:  Head: Normocephalic and atraumatic.  Mouth/Throat: Oropharynx is clear and moist. No oropharyngeal exudate.  Eyes: Conjunctivae are normal. Right eye exhibits no discharge. Left eye exhibits no discharge. No scleral icterus.  Neck: Normal range of motion. Neck supple. No JVD present. No tracheal deviation present. No thyromegaly present.  Cardiovascular: Normal rate, regular rhythm, normal heart sounds and intact distal pulses.  Exam reveals no gallop and no friction rub.   No murmur heard. Pulmonary/Chest: Effort normal and breath sounds normal. No stridor. No respiratory distress. He has no wheezes. He has no rales. He exhibits no tenderness.  Abdominal: Soft. Bowel sounds are normal. He exhibits no distension and no mass. There is no tenderness. There is no rebound and no guarding.  Musculoskeletal: Normal range of motion. He exhibits no edema and no tenderness.       Right hand: Normal.       Left hand: Normal.  Lymphadenopathy:    He has no cervical adenopathy.  Skin: Skin is warm, dry and intact. Lesion noted. No rash noted. He is not diaphoretic. No pallor.     Psychiatric: He has a normal mood and affect. His behavior is normal. Judgment and thought content  normal.     Lab Results  Component Value Date   WBC 9.1 09/09/2012   HGB 14.8 09/09/2012   HCT 44.1 09/09/2012   PLT 234.0 09/09/2012   GLUCOSE 130* 01/25/2013   CHOL 154  08/24/2012   TRIG 120.0 08/24/2012   HDL 51.10 08/24/2012   LDLCALC 79 08/24/2012   ALT 28 08/24/2012   AST 24 08/24/2012   NA 141 01/25/2013   K 4.4 01/25/2013   CL 107 01/25/2013   CREATININE 1.1 01/25/2013   BUN 20 01/25/2013   CO2 26 01/25/2013   TSH 0.65 08/24/2012   PSA 3.05 12/14/2010   INR 1.0 09/09/2012   HGBA1C 6.9* 01/25/2013       Assessment & Plan:

## 2013-04-28 NOTE — Patient Instructions (Signed)
Type 2 Diabetes Mellitus, Adult Type 2 diabetes mellitus, often simply referred to as type 2 diabetes, is a long-lasting (chronic) disease. In type 2 diabetes, the pancreas does not make enough insulin (a hormone), the cells are less responsive to the insulin that is made (insulin resistance), or both. Normally, insulin moves sugars from food into the tissue cells. The tissue cells use the sugars for energy. The lack of insulin or the lack of normal response to insulin causes excess sugars to build up in the blood instead of going into the tissue cells. As a result, high blood sugar (hyperglycemia) develops. The effect of high sugar (glucose) levels can cause many complications. Type 2 diabetes was also previously called adult-onset diabetes but it can occur at any age.  RISK FACTORS  A person is predisposed to developing type 2 diabetes if someone in the family has the disease and also has one or more of the following primary risk factors:  Overweight.  An inactive lifestyle.  A history of consistently eating high-calorie foods. Maintaining a normal weight and regular physical activity can reduce the chance of developing type 2 diabetes. SYMPTOMS  A person with type 2 diabetes may not show symptoms initially. The symptoms of type 2 diabetes appear slowly. The symptoms include:  Increased thirst (polydipsia).  Increased urination (polyuria).  Increased urination during the night (nocturia).  Weight loss. This weight loss may be rapid.  Frequent, recurring infections.  Tiredness (fatigue).  Weakness.  Vision changes, such as blurred vision.  Fruity smell to your breath.  Abdominal pain.  Nausea or vomiting.  Cuts or bruises which are slow to heal.  Tingling or numbness in the hands or feet. DIAGNOSIS Type 2 diabetes is frequently not diagnosed until complications of diabetes are present. Type 2 diabetes is diagnosed when symptoms or complications are present and when blood  glucose levels are increased. Your blood glucose level may be checked by one or more of the following blood tests:  A fasting blood glucose test. You will not be allowed to eat for at least 8 hours before a blood sample is taken.  A random blood glucose test. Your blood glucose is checked at any time of the day regardless of when you ate.  A hemoglobin A1c blood glucose test. A hemoglobin A1c test provides information about blood glucose control over the previous 3 months.  An oral glucose tolerance test (OGTT). Your blood glucose is measured after you have not eaten (fasted) for 2 hours and then after you drink a glucose-containing beverage. TREATMENT   You may need to take insulin or diabetes medicine daily to keep blood glucose levels in the desired range.  You will need to match insulin dosing with exercise and healthy food choices. The treatment goal is to maintain the before meal blood sugar (preprandial glucose) level at 70 130 mg/dL. HOME CARE INSTRUCTIONS   Have your hemoglobin A1c level checked twice a year.  Perform daily blood glucose monitoring as directed by your caregiver.  Monitor urine ketones when you are ill and as directed by your caregiver.  Take your diabetes medicine or insulin as directed by your caregiver to maintain your blood glucose levels in the desired range.  Never run out of diabetes medicine or insulin. It is needed every day.  Adjust insulin based on your intake of carbohydrates. Carbohydrates can raise blood glucose levels but need to be included in your diet. Carbohydrates provide vitamins, minerals, and fiber which are an essential part of   a healthy diet. Carbohydrates are found in fruits, vegetables, whole grains, dairy products, legumes, and foods containing added sugars.    Eat healthy foods. Alternate 3 meals with 3 snacks.  Lose weight if overweight.  Carry a medical alert card or wear your medical alert jewelry.  Carry a 15 gram  carbohydrate snack with you at all times to treat low blood glucose (hypoglycemia). Some examples of 15 gram carbohydrate snacks include:  Glucose tablets, 3 or 4   Glucose gel, 15 gram tube  Raisins, 2 tablespoons (24 grams)  Jelly beans, 6  Animal crackers, 8  Regular pop, 4 ounces (120 mL)  Gummy treats, 9  Recognize hypoglycemia. Hypoglycemia occurs with blood glucose levels of 70 mg/dL and below. The risk for hypoglycemia increases when fasting or skipping meals, during or after intense exercise, and during sleep. Hypoglycemia symptoms can include:  Tremors or shakes.  Decreased ability to concentrate.  Sweating.  Increased heart rate.  Headache.  Dry mouth.  Hunger.  Irritability.  Anxiety.  Restless sleep.  Altered speech or coordination.  Confusion.  Treat hypoglycemia promptly. If you are alert and able to safely swallow, follow the 15:15 rule:  Take 15 20 grams of rapid-acting glucose or carbohydrate. Rapid-acting options include glucose gel, glucose tablets, or 4 ounces (120 mL) of fruit juice, regular soda, or low fat milk.  Check your blood glucose level 15 minutes after taking the glucose.  Take 15 20 grams more of glucose if the repeat blood glucose level is still 70 mg/dL or below.  Eat a meal or snack within 1 hour once blood glucose levels return to normal.    Be alert to polyuria and polydipsia which are early signs of hyperglycemia. An early awareness of hyperglycemia allows for prompt treatment. Treat hyperglycemia as directed by your caregiver.  Engage in at least 150 minutes of moderate-intensity physical activity a week, spread over at least 3 days of the week or as directed by your caregiver. In addition, you should engage in resistance exercise at least 2 times a week or as directed by your caregiver.  Adjust your medicine and food intake as needed if you start a new exercise or sport.  Follow your sick day plan at any time you  are unable to eat or drink as usual.  Avoid tobacco use.  Limit alcohol intake to no more than 1 drink per day for nonpregnant women and 2 drinks per day for men. You should drink alcohol only when you are also eating food. Talk with your caregiver whether alcohol is safe for you. Tell your caregiver if you drink alcohol several times a week.  Follow up with your caregiver regularly.  Schedule an eye exam soon after the diagnosis of type 2 diabetes and then annually.  Perform daily skin and foot care. Examine your skin and feet daily for cuts, bruises, redness, nail problems, bleeding, blisters, or sores. A foot exam by a caregiver should be done annually.  Brush your teeth and gums at least twice a day and floss at least once a day. Follow up with your dentist regularly.  Share your diabetes management plan with your workplace or school.  Stay up-to-date with immunizations.  Learn to manage stress.  Obtain ongoing diabetes education and support as needed.  Participate in, or seek rehabilitation as needed to maintain or improve independence and quality of life. Request a physical or occupational therapy referral if you are having foot or hand numbness or difficulties with grooming,   dressing, eating, or physical activity. SEEK MEDICAL CARE IF:   You are unable to eat food or drink fluids for more than 6 hours.  You have nausea and vomiting for more than 6 hours.  Your blood glucose level is over 240 mg/dL.  There is a change in mental status.  You develop an additional serious illness.  You have diarrhea for more than 6 hours.  You have been sick or have had a fever for a couple of days and are not getting better.  You have pain during any physical activity.  SEEK IMMEDIATE MEDICAL CARE IF:  You have difficulty breathing.  You have moderate to large ketone levels. MAKE SURE YOU:  Understand these instructions.  Will watch your condition.  Will get help right away if  you are not doing well or get worse. Document Released: 01/14/2005 Document Revised: 10/09/2011 Document Reviewed: 08/13/2011 [employer] Patient Information 2014 [employer].  

## 2013-04-28 NOTE — Assessment & Plan Note (Signed)
He will cont tramadol and APAP as needed

## 2013-04-28 NOTE — Telephone Encounter (Signed)
Prior patient of Dr. Mariann Barter if he needs to follow up here if his labs are stable? Made him aware that he is due labs in May-since he is seeing her PCP today, would be reasonable to see if he is willing to order the labs that Dr. Lamonte Sakai had ordered on his last visit and then can forward results here for his new provider to review. If PCP does not do labs, he can call office to set up lab appointment and then see provider few days later to discuss results

## 2013-04-28 NOTE — Assessment & Plan Note (Signed)
I will check his SPEP today to see if this has progressed into a lymphoproliferative process

## 2013-04-28 NOTE — Assessment & Plan Note (Signed)
Derm referral to consider a biopsy

## 2013-04-29 ENCOUNTER — Encounter: Payer: Self-pay | Admitting: Internal Medicine

## 2013-04-30 LAB — SPEP & IFE WITH QIG
ALBUMIN ELP: 58.1 % (ref 55.8–66.1)
ALPHA-1-GLOBULIN: 3.6 % (ref 2.9–4.9)
ALPHA-2-GLOBULIN: 10.5 % (ref 7.1–11.8)
Beta 2: 4.1 % (ref 3.2–6.5)
Beta Globulin: 6.6 % (ref 4.7–7.2)
GAMMA GLOBULIN: 17.1 % (ref 11.1–18.8)
IGM, SERUM: 44 mg/dL (ref 41–251)
IgA: 224 mg/dL (ref 68–379)
IgG (Immunoglobin G), Serum: 1460 mg/dL (ref 650–1600)
M-Spike, %: 0.88 g/dL
Total Protein, Serum Electrophoresis: 7.4 g/dL (ref 6.0–8.3)

## 2013-05-02 ENCOUNTER — Encounter: Payer: Self-pay | Admitting: Internal Medicine

## 2013-05-15 ENCOUNTER — Other Ambulatory Visit: Payer: Self-pay | Admitting: Internal Medicine

## 2013-05-28 ENCOUNTER — Ambulatory Visit: Payer: Medicare Other | Admitting: Internal Medicine

## 2013-05-31 ENCOUNTER — Encounter: Payer: Self-pay | Admitting: Internal Medicine

## 2013-06-10 ENCOUNTER — Other Ambulatory Visit: Payer: Medicare Other

## 2013-06-17 ENCOUNTER — Ambulatory Visit (HOSPITAL_BASED_OUTPATIENT_CLINIC_OR_DEPARTMENT_OTHER): Payer: Managed Care, Other (non HMO) | Admitting: Hematology and Oncology

## 2013-06-17 ENCOUNTER — Other Ambulatory Visit: Payer: Medicare Other | Admitting: Lab

## 2013-06-17 ENCOUNTER — Encounter: Payer: Self-pay | Admitting: Hematology and Oncology

## 2013-06-17 VITALS — BP 131/69 | HR 72 | Temp 98.0°F | Resp 20 | Ht 72.0 in | Wt 233.1 lb

## 2013-06-17 DIAGNOSIS — I2510 Atherosclerotic heart disease of native coronary artery without angina pectoris: Secondary | ICD-10-CM

## 2013-06-17 DIAGNOSIS — D472 Monoclonal gammopathy: Secondary | ICD-10-CM

## 2013-06-17 NOTE — Progress Notes (Signed)
[facility] progress notes  Patient Care Team: Janith Lima, MD as PCP - General Nobie Putnam, MD (Hematology and Oncology) Larey Dresser, MD (Cardiology)  CHIEF COMPLAINTS/PURPOSE OF VISIT:  IgG kappa MGUS, on observation  HISTORY OF PRESENTING ILLNESS:  Alfred Armstrong 80 y.o. male was transferred to my care after his prior physician has left.  I reviewed the patient's records extensive and collaborated the history with the patient. Summary of his history is as follows: This patient was told to have abnormal blood work and was subsequently found to have monoclonal gammopathy of unknown significance. The patient have chronic arthritis. Denies atypical bone fracture. Blood work, urine test and skeletal survey showed no evidence of anemia, renal failure, bone lesions or hypercalcemia. He was placed on observation.  Since he was last seen him, he feels well. Denies any new bone pain. No recent infection. His appetite is stable, no recent weight loss.  MEDICAL HISTORY:  Past Medical History  Diagnosis Date  . COPD (chronic obstructive pulmonary disease)   . Osteoarthrosis, unspecified whether generalized or localized, unspecified site   . History of nephrolithiasis   . GERD (gastroesophageal reflux disease)   . Tubulovillous adenoma polyp of colon     w/HGD 1999  . Hyperlipidemia   . Carotid stenosis     Followed by Dr Amedeo Plenty - Angiogram 5/10 showed left common carotid to be totally occluded and 50% RICA stenosis  . HTN (hypertension)     ACEI Cough  . CVA (cerebral infarction)   . Shingles   . MGUS (monoclonal gammopathy of unknown significance) 06/17/2011  . CAD (coronary artery disease)     Dr. Aundra Dubin  . Anxiety     related to hosp. enviromental   . Shortness of breath     uses symbicort daily, when taking the garbage   . H/O hiatal hernia   . Headache(784.0)     annoyance related to enviromental allergies   . Neuromuscular disorder      neuropathy    SURGICAL HISTORY: Past Surgical History  Procedure Laterality Date  . Tonsillectomy  1941  . Coronary angioplasty  04/2008  . Carotid artery angiogram  04/2008  . Coronary artery bypass graft  [facility] in [place]   . Endarterectomy Right 06/18/2012    Procedure: ENDARTERECTOMY CAROTID;  Surgeon: Angelia Mould, MD;  Location: [facility];  Service: Vascular;  Laterality: Right;  with resection of redundant Right Internal Carotid Artery  . Patch angioplasty Right 06/18/2012    Procedure: PATCH ANGIOPLASTY;  Surgeon: Angelia Mould, MD;  Location: [facility] OR;  Service: Vascular;  Laterality: Right;  using Hemashield Platinum Finesse Patch  . Carotid endarterectomy      SOCIAL HISTORY: History   Social History  . Marital Status: Single    Spouse Name: N/A    Number of Children: N/A  . Years of Education: N/A   Occupational History  . Retired   . 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 Smoking status: Former Smoker    Types: Cigarettes    Quit date: 01/28/1993  . Smokeless tobacco: Never Used  . Alcohol Use: No     Comment: stopped drinking many yrs. ago   . Drug Use: No  . Sexual Activity: Not Currently   Other Topics Concern  . Not on file   Social History Narrative   Regular Exercise -  YES   Daily Caffeine Use:  2-3 cups/WEEKLY             FAMILY HISTORY: Family History  Problem Relation Age of Onset  . Alcohol abuse Mother   . Alcohol abuse Father   . Colon cancer Neg Hx     ALLERGIES:  is allergic to tiotropium bromide monohydrate and daliresp.  MEDICATIONS:  Current Outpatient Prescriptions  Medication Sig Dispense Refill  . acetaminophen (TYLENOL) 500 MG tablet Take 1,000 mg by mouth 2 (two) times daily as needed for pain.      Marland Kitchen aspirin 81 MG EC tablet Take 81 mg by mouth daily.       . budesonide-formoterol (SYMBICORT) 160-4.5 MCG/ACT inhaler Inhale 2 puffs into the  lungs 2 (two) times daily.  4 Inhaler  0  . cholecalciferol (VITAMIN D) 1000 UNITS tablet Take 1,000 Units by mouth 2 (two) times daily.      Marland Kitchen losartan (COZAAR) 25 MG tablet Take 2 tablets (50 mg total) by mouth daily.  90 tablet  3  . Multiple Vitamins-Minerals (ONE-A-DAY MENS 50+ ADVANTAGE PO) Take by mouth.      Marland Kitchen NASONEX 50 MCG/ACT nasal spray Use one or two sprays in each nostril daily  17 g  10  . nebivolol (BYSTOLIC) 10 MG tablet Take 1 tablet (10 mg total) by mouth daily before breakfast.      . pregabalin (LYRICA) 50 MG capsule Take 50 mg by mouth 3 (three) times daily as needed (for pain).      . rosuvastatin (CRESTOR) 20 MG tablet Take 20 mg by mouth daily.      . traMADol-acetaminophen (ULTRACET) 37.5-325 MG per tablet TAKE ONE TABLET BY MOUTH FOUR TIMES DAILY AS NEEDED FOR PAIN   100 tablet  3  . zolpidem (AMBIEN) 10 MG tablet TAKE ONE TABLET BY MOUTH AT BEDTIME AS NEEDED   30 tablet  4  . [DISCONTINUED] Hypertonic Nasal Wash (SINUS RINSE BOTTLE KIT NA) by Nasal route as directed.         No current facility-administered medications for this visit.    REVIEW OF SYSTEMS:   Constitutional: Denies fevers, chills or abnormal night sweats Eyes: Denies blurriness of vision, double vision or watery eyes Ears, nose, mouth, throat, and face: Denies mucositis or sore throat Respiratory: Denies cough, dyspnea or wheezes Gastrointestinal:  Denies nausea, heartburn or change in bowel habits Skin: Denies abnormal skin rashes Lymphatics: Denies new lymphadenopathy or easy bruising Neurological:Denies numbness, tingling or new weaknesses Behavioral/Psych: Mood is stable, no new changes  All other systems were reviewed with the patient and are negative.  PHYSICAL EXAMINATION: ECOG PERFORMANCE STATUS: 0 - Asymptomatic  Filed Vitals:   06/17/13 1308  BP: 131/69  Pulse: 72  Temp: 98 F (36.7 C)  Resp: 20   Filed Weights   06/17/13 1308  Weight: 233 lb 1.6 oz (105.733 kg)     GENERAL:alert, no distress and comfortable. He is mildly obese SKIN: skin color, texture, turgor are normal, no rashes or significant lesions EYES: normal, conjunctiva are pink and non-injected, sclera clear OROPHARYNX:no exudate, normal lips, buccal mucosa, and tongue  NECK: supple, thyroid normal size, non-tender, without nodularity. Well-healed surgical scar LYMPH:  no palpable lymphadenopathy in the cervical, axillary or inguinal LUNGS: clear to auscultation and percussion with normal breathing effort HEART: regular rate & rhythm and no murmurs with mild left lower extremity edema ABDOMEN:abdomen soft, non-tender and normal bowel sounds Musculoskeletal:no cyanosis of digits and no clubbing  PSYCH: alert & oriented x 3 with fluent speech NEURO: no focal motor/sensory deficits  LABORATORY DATA:  I have reviewed the data as listed Lab Results  Component Value Date   WBC 8.1 04/28/2013   HGB 14.1 04/28/2013   HCT 41.9 04/28/2013   MCV 93.3 04/28/2013   PLT 243.0 04/28/2013    Recent Labs  06/19/12 0330 08/24/12 1406 09/09/12 1619 01/25/13 1356 04/28/13 1457  NA 138 138 139 141 135  K 3.7 4.4 4.4 4.4 3.8  CL 107 106 106 107 106  CO2 22 25 24 26 23   GLUCOSE 147* 112* 121* 130* 235*  BUN 24* 14 13 20 17   CREATININE 1.16 1.1 1.1 1.1 1.1  CALCIUM 8.4 9.6 9.6 9.5 9.1  GFRNONAA 58*  --   --   --   --   GFRAA 68*  --   --   --   --   PROT  --  7.8  --   --   --   ALBUMIN  --  4.1  --   --   --   AST  --  24  --   --   --   ALT  --  28  --   --   --   ALKPHOS  --  45  --   --   --   BILITOT  --  0.7  --   --   --    ASSESSMENT & PLAN:  #1 IgG kappa MGUS Hais M spike is very stable. There is no evidence of hypercalcemia, anemia, renal failure or bone lesions. I will continue yearly observation with history, physical examination and blood work. #2 coronary artery disease The patient is currently on all appropriate treatment for this. #3 preventive care He will continue on  vitamin D supplements. Orders Placed This Encounter  Procedures  . CBC with Differential    Standing Status: Future     Number of Occurrences:      Standing Expiration Date: 06/17/2014  . Comprehensive metabolic panel    Standing Status: Future     Number of Occurrences:      Standing Expiration Date: 06/17/2014  . Beta 2 microglobulin, serum    Standing Status: Future     Number of Occurrences:      Standing Expiration Date: 06/18/2014  . SPEP & IFE with QIG    Standing Status: Future     Number of Occurrences:      Standing Expiration Date: 06/17/2014  . Kappa/lambda light chains    Standing Status: Future     Number of Occurrences:      Standing Expiration Date: 06/17/2014    All questions were answered. The patient knows to call the clinic with any problems, questions or concerns. I spent 15 minutes counseling the patient face to face. The total time spent in the appointment was 20 minutes and more than 50% was on counseling.     Bales Lark, MD 06/17/2013 2:18 PM

## 2013-06-22 ENCOUNTER — Telehealth: Payer: Self-pay | Admitting: Hematology and Oncology

## 2013-06-22 NOTE — Telephone Encounter (Signed)
lmonvm for pt re appts for may 2016. schedule mailed.

## 2013-07-09 ENCOUNTER — Other Ambulatory Visit: Payer: Self-pay | Admitting: Internal Medicine

## 2013-07-13 ENCOUNTER — Encounter: Payer: Self-pay | Admitting: Vascular Surgery

## 2013-07-14 ENCOUNTER — Ambulatory Visit (INDEPENDENT_AMBULATORY_CARE_PROVIDER_SITE_OTHER): Payer: Managed Care, Other (non HMO) | Admitting: Vascular Surgery

## 2013-07-14 ENCOUNTER — Ambulatory Visit (HOSPITAL_COMMUNITY)
Admission: RE | Admit: 2013-07-14 | Discharge: 2013-07-14 | Disposition: A | Discharge status: Home / Self Care | Payer: Medicare HMO | Source: Ambulatory Visit | Attending: Vascular Surgery | Admitting: Vascular Surgery

## 2013-07-14 ENCOUNTER — Encounter: Payer: Self-pay | Admitting: Vascular Surgery

## 2013-07-14 VITALS — BP 107/64 | HR 63 | Ht 72.0 in | Wt 232.6 lb

## 2013-07-14 DIAGNOSIS — I6529 Occlusion and stenosis of unspecified carotid artery: Secondary | ICD-10-CM

## 2013-07-14 DIAGNOSIS — Z48812 Encounter for surgical aftercare following surgery on the circulatory system: Secondary | ICD-10-CM

## 2013-07-14 DIAGNOSIS — I658 Occlusion and stenosis of other precerebral arteries: Secondary | ICD-10-CM | POA: Insufficient documentation

## 2013-07-14 NOTE — Progress Notes (Signed)
Postoperative Visit  History of Present Illness  Alfred Armstrong is a 80 y.o. male who presents for postoperative follow-up for: right CEA (Date: 06/18/2012).  The patient's neck incision is  healed.  The patient has had no stroke or TIA symptoms.  For VQI Use Only  PRE-ADM LIVING: Home  AMB STATUS: Ambulatory  History   Social History  . Marital Status: Single    Spouse Name: N/A    Number of Children: N/A  . Years of Education: N/A   Occupational History  . Retired   . 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 Smoking status: Former Smoker    Types: Cigarettes    Quit date: 01/28/1993  . Smokeless tobacco: Never Used  . Alcohol Use: No     Comment: stopped drinking many yrs. ago   . Drug Use: No  . Sexual Activity: Not Currently   Other Topics Concern  . Not on file   Social History Narrative   Regular Exercise -  YES   Daily Caffeine Use:  2-3 cups/WEEKLY            Current Outpatient Prescriptions on File Prior to Visit  Medication Sig Dispense Refill  . acetaminophen (TYLENOL) 500 MG tablet Take 1,000 mg by mouth 2 (two) times daily as needed for pain.      Marland Kitchen aspirin 81 MG EC tablet Take 81 mg by mouth daily.       . budesonide-formoterol (SYMBICORT) 160-4.5 MCG/ACT inhaler Inhale 2 puffs into the lungs 2 (two) times daily.  4 Inhaler  0  . cholecalciferol (VITAMIN D) 1000 UNITS tablet Take 1,000 Units by mouth 2 (two) times daily.      Marland Kitchen losartan (COZAAR) 25 MG tablet Take 2 tablets (50 mg total) by mouth daily.  90 tablet  3  . Multiple Vitamins-Minerals (ONE-A-DAY MENS 50+ ADVANTAGE PO) Take by mouth.      Marland Kitchen NASONEX 50 MCG/ACT nasal spray Use one or two sprays in each nostril daily  17 g  10  . nebivolol (BYSTOLIC) 10 MG tablet Take 1 tablet (10 mg total) by mouth daily before breakfast.      . pregabalin (LYRICA) 50 MG capsule Take 50 mg by mouth 3 (three) times daily as needed (for pain).      . rosuvastatin  (CRESTOR) 20 MG tablet Take 20 mg by mouth daily.      . traMADol-acetaminophen (ULTRACET) 37.5-325 MG per tablet TAKE ONE TABLET BY MOUTH FOUR TIMES DAILY AS NEEDED FOR PAIN   100 tablet  3  . zolpidem (AMBIEN) 10 MG tablet TAKE ONE TABLET BY MOUTH NIGHTLY AT BEDTIME   30 tablet  3  . [DISCONTINUED] Hypertonic Nasal Wash (SINUS RINSE BOTTLE KIT NA) by Nasal route as directed.         No current facility-administered medications on file prior to visit.    Physical Examination  Filed Vitals:   07/14/13 1352  BP: 107/64  Pulse:    Right carotid duplex 60-79 % re stenosis at the distal patch.  Right Neck: Incision is  healed Neuro: CN 2-12 are intact  , Motor strength is 5/5  bilaterally, sensation is  grossly intact  Lungs CTA Heart RRR Palpable radial pulses equal Bilaterally  Medical Decision Making  Alfred Armstrong is a 80 y.o. male who presents s/p Right CEA.  The patient's neck incision is  Healed  He reports no  stroke symptoms.  The reported restenosis at the distal patch is statistically due to intimal hyperplasia.  The stenosis is below 80%.  At this point we will have him f/u in 6 months for a f/u duplex.  He is taking an 81 MG Asprin daily as well as Crestor.  We have discussed the importance of eating fruits and vegetables as well as daily exercise such as walking.  The patient was seen by Dr. Scot Dock in clinic today.  [facility] Office: [phone]  VASCULAR QUALITY INITIATIVE FOLLOW UP DATA:  Current smoker: [  ] yes  [x  ] no  Living status: [ x ]  Home  [  ] Nursing home  [  ] Homeless    MEDS:  ASA [ x ] yes  [  ] no- [  ] medical reason  [  ] non compliant  STATIN  [ x ] yes  [  ] no- [  ] medical reason  [  ] non compliant  Beta blocker [ x ] yes  [  ] no- [  ] medical reason  [  ] non compliant  ACE inhibitor [  ] yes  [ x ] no- [x  ] medical reason  [  ] non compliant  P2Y12 Antagonist [ x ] none  [  ]  clopidogrel-Plavix  [  ] ticlopidine-Ticlid   [  ] prasugrel-Effient  [  ] ticagrelor- Brilinta    Anticoagulant [x]  None  [  ] warfarin  [  ] rivaroxaban-Xarelto [  ] dabigatran- Pradaxa  Neurologic event since D/C:  [ x ] no  [  ] yes: [  ] eye event  [  ] cortical event  [  ] VB event  [  ] non specific event  [  ] right  [  ] left  [  ] TIA  [  ] stroke  Date:   Modified Rankin Score: 0  MI since D/C: [x  ] no  [  ] troponin only  [  ] EKG or clinical  Cranial nerve injury: [ x ] none  [  ] resolved  [  ] persistent  Duplex CEA site: [  ] no  [  ] yes -   EDV= 77  ICA/CCA ratio:  Stenosis= [  ] <40% [  ] 40-59% [x  ] 60-79%  [  ] > 80%  [  ]  Occluded  CEA site re-operation:  [x  ] no   [  ] yes- date of re-op:  CEA site PCI:   [x  ] no   [  ] yes- date of PCI:  Agree with above. Given the timing of the recurrent carotid stenosis on the right this is most likely due to intimal hyperplasia and hopefully this will request with time. He is to come back in 6 months for a follow up duplex scan. He knows to call sooner if he has problems. He is on aspirin and is on a statin.  Deitra Mayo, MD, FACS Beeper [phone] 07/14/2013

## 2013-08-30 ENCOUNTER — Ambulatory Visit: Payer: Managed Care, Other (non HMO) | Admitting: Internal Medicine

## 2013-08-30 ENCOUNTER — Telehealth: Payer: Self-pay | Admitting: Internal Medicine

## 2013-08-30 NOTE — Telephone Encounter (Signed)
Patient stated he received a call but did not receive a voice mail.  Did you call him?

## 2013-08-30 NOTE — Telephone Encounter (Signed)
Notified patient that was not sure who called and also verified appt for the 14th.

## 2013-09-10 ENCOUNTER — Ambulatory Visit: Payer: Medicare HMO | Admitting: Internal Medicine

## 2013-09-10 ENCOUNTER — Other Ambulatory Visit (INDEPENDENT_AMBULATORY_CARE_PROVIDER_SITE_OTHER): Payer: Medicare HMO

## 2013-09-10 ENCOUNTER — Encounter: Payer: Self-pay | Admitting: Internal Medicine

## 2013-09-10 ENCOUNTER — Ambulatory Visit (INDEPENDENT_AMBULATORY_CARE_PROVIDER_SITE_OTHER): Payer: Medicare HMO | Admitting: Internal Medicine

## 2013-09-10 VITALS — BP 136/72 | HR 54 | Temp 98.1°F | Resp 16 | Ht 72.0 in | Wt 235.8 lb

## 2013-09-10 DIAGNOSIS — IMO0001 Reserved for inherently not codable concepts without codable children: Secondary | ICD-10-CM

## 2013-09-10 DIAGNOSIS — K219 Gastro-esophageal reflux disease without esophagitis: Secondary | ICD-10-CM

## 2013-09-10 DIAGNOSIS — I10 Essential (primary) hypertension: Secondary | ICD-10-CM

## 2013-09-10 DIAGNOSIS — E1165 Type 2 diabetes mellitus with hyperglycemia: Secondary | ICD-10-CM

## 2013-09-10 DIAGNOSIS — E785 Hyperlipidemia, unspecified: Secondary | ICD-10-CM

## 2013-09-10 DIAGNOSIS — E1149 Type 2 diabetes mellitus with other diabetic neurological complication: Secondary | ICD-10-CM

## 2013-09-10 DIAGNOSIS — I739 Peripheral vascular disease, unspecified: Secondary | ICD-10-CM

## 2013-09-10 DIAGNOSIS — J4489 Other specified chronic obstructive pulmonary disease: Secondary | ICD-10-CM

## 2013-09-10 DIAGNOSIS — J449 Chronic obstructive pulmonary disease, unspecified: Secondary | ICD-10-CM

## 2013-09-10 DIAGNOSIS — D472 Monoclonal gammopathy: Secondary | ICD-10-CM

## 2013-09-10 LAB — BASIC METABOLIC PANEL
BUN: 13 mg/dL (ref 6–23)
CHLORIDE: 104 meq/L (ref 96–112)
CO2: 25 meq/L (ref 19–32)
Calcium: 9.8 mg/dL (ref 8.4–10.5)
Creatinine, Ser: 1 mg/dL (ref 0.4–1.5)
GFR: 80.15 mL/min (ref >60.00)
Glucose, Bld: 118 mg/dL — ABNORMAL HIGH (ref 70–99)
POTASSIUM: 4.4 meq/L (ref 3.5–5.1)
SODIUM: 137 meq/L (ref 135–145)

## 2013-09-10 LAB — LIPID PANEL
Cholesterol: 129 mg/dL (ref 0–200)
HDL: 47.5 mg/dL (ref >39.00)
LDL Cholesterol: 52 mg/dL (ref 0–99)
NONHDL: 81.5
Total CHOL/HDL Ratio: 3
Triglycerides: 148 mg/dL (ref 0.0–149.0)
VLDL: 29.6 mg/dL (ref 0.0–40.0)

## 2013-09-10 LAB — HEMOGLOBIN A1C: Hgb A1c MFr Bld: 6.9 % — ABNORMAL HIGH (ref 4.6–6.5)

## 2013-09-10 MED ORDER — TRAMADOL-ACETAMINOPHEN 37.5-325 MG PO TABS: 1.0000 | ORAL_TABLET | Freq: Four times a day (QID) | ORAL | Status: DC | PRN

## 2013-09-10 MED ORDER — METOPROLOL SUCCINATE ER 50 MG PO TB24: 50.0000 mg | ORAL_TABLET | Freq: Every day | ORAL | Status: DC

## 2013-09-10 MED ORDER — LOSARTAN POTASSIUM 25 MG PO TABS: 25.0000 mg | ORAL_TABLET | Freq: Every day | ORAL | Status: DC

## 2013-09-10 MED ORDER — BUDESONIDE-FORMOTEROL FUMARATE 160-4.5 MCG/ACT IN AERO: 2.0000 | INHALATION_SPRAY | Freq: Two times a day (BID) | RESPIRATORY_TRACT | Status: DC

## 2013-09-10 MED ORDER — ROSUVASTATIN CALCIUM 20 MG PO TABS: 20.0000 mg | ORAL_TABLET | Freq: Every day | ORAL | Status: DC

## 2013-09-10 NOTE — Assessment & Plan Note (Signed)
He is due for an FLP today

## 2013-09-10 NOTE — Assessment & Plan Note (Signed)
His BP is well controlled He tells me that bystolic is too expensive, will change to toprol I will monitor his lytes and renal function today

## 2013-09-10 NOTE — Progress Notes (Signed)
Pre visit review using our clinic review tool, if applicable. No additional management support is needed unless otherwise documented below in the visit note. 

## 2013-09-10 NOTE — Patient Instructions (Signed)

## 2013-09-10 NOTE — Assessment & Plan Note (Signed)
His blood sugars have been well controlled I will monitor his lytes and renal function today

## 2013-09-10 NOTE — Progress Notes (Signed)
   Subjective:    Patient ID: Alfred Armstrong, male    DOB: [birth year], 80 y.o.   MRN: [id number]  Hypertension This is a chronic problem. The current episode started more than 1 year ago. The problem has been gradually improving since onset. The problem is controlled. Pertinent negatives include no anxiety, blurred vision, chest pain, headaches, malaise/fatigue, neck pain, orthopnea, palpitations, peripheral edema, PND, shortness of breath or sweats. There are no associated agents to hypertension. Past treatments include beta blockers and angiotensin blockers. The current treatment provides moderate improvement. Compliance problems include diet and exercise.  Hypertensive end-organ damage includes CAD/MI and PVD.      Review of Systems  Constitutional: Positive for fatigue. Negative for fever, chills, malaise/fatigue, diaphoresis and appetite change.  HENT: Negative.   Eyes: Negative.  Negative for blurred vision.  Respiratory: Negative.  Negative for cough, choking, chest tightness and shortness of breath.   Cardiovascular: Negative.  Negative for chest pain, palpitations, orthopnea, leg swelling and PND.  Gastrointestinal: Negative.  Negative for nausea, vomiting, abdominal pain, constipation and blood in stool.  Endocrine: Negative.  Negative for polydipsia and polyphagia.  Genitourinary: Negative.   Musculoskeletal: Positive for arthralgias. Negative for back pain, myalgias, neck pain and neck stiffness.  Skin: Negative.  Negative for rash.  Allergic/Immunologic: Negative.   Neurological: Negative.  Negative for dizziness, tremors, weakness, light-headedness, numbness and headaches.  Hematological: Negative.  Negative for adenopathy. Does not bruise/bleed easily.  Psychiatric/Behavioral: Negative.        Objective:   Physical Exam  Vitals reviewed. Constitutional: He is oriented to person, place, and time. He appears well-developed and well-nourished. No distress.  HENT:  Head:  Normocephalic and atraumatic.  Mouth/Throat: Oropharynx is clear and moist. No oropharyngeal exudate.  Eyes: Conjunctivae are normal. Right eye exhibits no discharge. Left eye exhibits no discharge. No scleral icterus.  Neck: Normal range of motion. Neck supple. No JVD present. No tracheal deviation present. No thyromegaly present.  Cardiovascular: Normal rate, regular rhythm, normal heart sounds and intact distal pulses.  Exam reveals no gallop and no friction rub.   No murmur heard. Pulmonary/Chest: Effort normal and breath sounds normal. No stridor. No respiratory distress. He has no wheezes. He has no rales. He exhibits no tenderness.  Abdominal: Soft. Bowel sounds are normal. He exhibits no distension and no mass. There is no tenderness. There is no rebound and no guarding.  Musculoskeletal: Normal range of motion. He exhibits no edema and no tenderness.  Lymphadenopathy:    He has no cervical adenopathy.  Neurological: He is oriented to person, place, and time.  Skin: Skin is warm and dry. No rash noted. He is not diaphoretic. No erythema. No pallor.     Lab Results  Component Value Date   WBC 8.1 04/28/2013   HGB 14.1 04/28/2013   HCT 41.9 04/28/2013   PLT 243.0 04/28/2013   GLUCOSE 235* 04/28/2013   CHOL 154 08/24/2012   TRIG 120.0 08/24/2012   HDL 51.10 08/24/2012   LDLCALC 79 08/24/2012   ALT 28 08/24/2012   AST 24 08/24/2012   NA 135 04/28/2013   K 3.8 04/28/2013   CL 106 04/28/2013   CREATININE 1.1 04/28/2013   BUN 17 04/28/2013   CO2 23 04/28/2013   TSH 0.65 08/24/2012   PSA 3.05 12/14/2010   INR 1.0 09/09/2012   HGBA1C 6.7* 04/28/2013       Assessment & Plan:

## 2013-09-23 ENCOUNTER — Telehealth: Payer: Self-pay | Admitting: Internal Medicine

## 2013-09-23 NOTE — Telephone Encounter (Signed)
[facility] called regarding the PA request for budesonide-formoterol (SYMBICORT) 160-4.5 MCG/ACT inhaler.  The representative stated the medication doesn't require a PA, it was a billing issue from the pharmacy.  The medication has been approved and the pharmacy is aware.

## 2013-10-08 ENCOUNTER — Other Ambulatory Visit: Payer: Self-pay | Admitting: Internal Medicine

## 2013-11-01 ENCOUNTER — Ambulatory Visit: Payer: Medicare HMO | Admitting: Internal Medicine

## 2013-11-10 ENCOUNTER — Encounter: Payer: Self-pay | Admitting: Family

## 2013-11-10 ENCOUNTER — Other Ambulatory Visit: Payer: Self-pay | Admitting: Internal Medicine

## 2013-11-10 ENCOUNTER — Ambulatory Visit (INDEPENDENT_AMBULATORY_CARE_PROVIDER_SITE_OTHER): Payer: Medicare HMO | Admitting: Family

## 2013-11-10 ENCOUNTER — Other Ambulatory Visit: Payer: Medicare HMO

## 2013-11-10 VITALS — BP 160/82 | HR 63 | Temp 98.2°F | Resp 16 | Wt 229.0 lb

## 2013-11-10 DIAGNOSIS — R39198 Other difficulties with micturition: Secondary | ICD-10-CM

## 2013-11-10 DIAGNOSIS — R3919 Other difficulties with micturition: Secondary | ICD-10-CM

## 2013-11-10 DIAGNOSIS — Z23 Encounter for immunization: Secondary | ICD-10-CM

## 2013-11-10 DIAGNOSIS — R3989 Other symptoms and signs involving the genitourinary system: Secondary | ICD-10-CM | POA: Insufficient documentation

## 2013-11-10 DIAGNOSIS — R350 Frequency of micturition: Secondary | ICD-10-CM

## 2013-11-10 LAB — POCT URINALYSIS DIPSTICK
BILIRUBIN UA: NEGATIVE
Blood, UA: NEGATIVE
GLUCOSE UA: NEGATIVE
LEUKOCYTES UA: NEGATIVE
Nitrite, UA: NEGATIVE
Protein, UA: NEGATIVE
Spec Grav, UA: 1.015
Urobilinogen, UA: 0.2
pH, UA: 5

## 2013-11-10 MED ORDER — SULFAMETHOXAZOLE-TMP DS 800-160 MG PO TABS: 1.0000 | ORAL_TABLET | Freq: Two times a day (BID) | ORAL | Status: DC

## 2013-11-10 NOTE — Progress Notes (Signed)
Pre visit review using our clinic review tool, if applicable. No additional management support is needed unless otherwise documented below in the visit note. 

## 2013-11-10 NOTE — Assessment & Plan Note (Addendum)
UA was negative for leukocytes and nitrates, likely dehydration related. Will send culture. Start Bactrim for abnormal feeling. Instructed to increase fluids. Follow up if symptoms worsen or fail to improve. Pt in agreement with plan.

## 2013-11-10 NOTE — Progress Notes (Signed)
Subjective:    Patient ID: Alfred Armstrong, male    DOB: 12-Aug-1931, 80 y.o.   MRN: [id number]  HPI:  Alfred Armstrong is a 80 y.o. male who presents today for an acute visit.  1) Change in urine color -  Indicates that his urine color has fluctuated over the past several days. States his current urine color appears more yellow than normal.  He includes that his bladder area just feels weird. Denies abdomen, pain with urination, fevers, chills or night sweats.Expresses low back discomfort however believes that may be related to a previous fall last winter that never really got better. Denies any particular aspect that make it better or worse. States he drinks plenty of fluid. Takes a multivitamin, but is unsure of any recent changes.   2) Request for flu shot  Allergies  Allergen Reactions  . Tiotropium Bromide Monohydrate     Dry mouth  . Daliresp [Roflumilast]     Bad thoughts/dreams   Current Outpatient Prescriptions on File Prior to Visit  Medication Sig Dispense Refill  . acetaminophen (TYLENOL) 500 MG tablet Take 1,000 mg by mouth 2 (two) times daily as needed for pain.      Marland Kitchen aspirin 81 MG EC tablet Take 81 mg by mouth daily.       . cholecalciferol (VITAMIN D) 1000 UNITS tablet Take 1,000 Units by mouth 2 (two) times daily.      Marland Kitchen losartan (COZAAR) 25 MG tablet Take 1 tablet (25 mg total) by mouth daily.  90 tablet  3  . metoprolol succinate (TOPROL-XL) 50 MG 24 hr tablet Take 1 tablet (50 mg total) by mouth daily. Take with or immediately following a meal.  90 tablet  3  . Multiple Vitamins-Minerals (ONE-A-DAY MENS 50+ ADVANTAGE PO) Take by mouth.      . pregabalin (LYRICA) 50 MG capsule Take 50 mg by mouth 3 (three) times daily as needed (for pain).      . rosuvastatin (CRESTOR) 20 MG tablet Take 1 tablet (20 mg total) by mouth daily.  90 tablet  3  . traMADol-acetaminophen (ULTRACET) 37.5-325 MG per tablet Take 1 tablet by mouth every 6 (six) hours as needed.  100 tablet  3    . [DISCONTINUED] Hypertonic Nasal Wash (SINUS RINSE BOTTLE KIT NA) by Nasal route as directed.         No current facility-administered medications on file prior to visit.   Past Medical History  Diagnosis Date  . COPD (chronic obstructive pulmonary disease)   . Osteoarthrosis, unspecified whether generalized or localized, unspecified site   . History of nephrolithiasis   . GERD (gastroesophageal reflux disease)   . Tubulovillous adenoma polyp of colon     w/HGD 1999  . Hyperlipidemia   . Carotid stenosis     Followed by Dr Amedeo Plenty - Angiogram 5/10 showed left common carotid to be totally occluded and 50% RICA stenosis  . HTN (hypertension)     ACEI Cough  . CVA (cerebral infarction)   . Shingles   . MGUS (monoclonal gammopathy of unknown significance) 06/17/2011  . CAD (coronary artery disease)     Dr. Aundra Dubin  . Anxiety     related to hosp. enviromental   . Shortness of breath     uses symbicort daily, when taking the garbage   . H/O hiatal hernia   . Headache(784.0)     annoyance related to enviromental allergies   . Neuromuscular disorder  neuropathy  . Stroke   . Diabetes mellitus without complication   . Cancer     skin    Review of Systems  See HPI    Objective:    BP 160/82  Pulse 63  Temp(Src) 98.2 F (36.8 C) (Oral)  Resp 16  Wt 229 lb (103.874 kg)  SpO2 96% Nursing note and vital signs reviewed.  Physical Exam  Constitutional: He is oriented to person, place, and time. He appears well-nourished. No distress.  Cardiovascular: Normal rate, regular rhythm and normal heart sounds.   Pulmonary/Chest: Effort normal and breath sounds normal.  Abdominal: Soft. Bowel sounds are normal. He exhibits no distension and no mass. There is no tenderness. There is no rebound and no guarding.  No costovertebral tenderness present.  Neurological: He is alert and oriented to person, place, and time.  Skin: Skin is warm and dry.  Psychiatric: He has a normal mood and  affect. His behavior is normal. Judgment and thought content normal.       Assessment & Plan:

## 2013-11-10 NOTE — Patient Instructions (Addendum)
Thank you for choosing [facility].  Summary/Instructions:   Please pick up your medication at the pharmacy  Please complete the dose  If your symptoms worsen or fail to improve please let us.

## 2013-11-11 ENCOUNTER — Telehealth: Payer: Self-pay | Admitting: Family

## 2013-11-11 LAB — URINE CULTURE
Colony Count: NO GROWTH
Organism ID, Bacteria: NO GROWTH

## 2013-11-11 NOTE — Telephone Encounter (Signed)
Please call patient and inform him his urine culture was negative, indicating he does not have a UTI, therefore the antibiotics are not needed. If he continues to feel symptoms or has any further concerns advise him to make a follow up appointment.

## 2013-11-12 NOTE — Telephone Encounter (Signed)
Called pt to let him know he does not have UTI. Any further problems I told him to make an appointment to follow up.

## 2014-01-05 ENCOUNTER — Other Ambulatory Visit (HOSPITAL_COMMUNITY): Payer: Managed Care, Other (non HMO)

## 2014-01-06 ENCOUNTER — Ambulatory Visit: Payer: Medicare HMO | Admitting: Internal Medicine

## 2014-01-12 ENCOUNTER — Other Ambulatory Visit (HOSPITAL_COMMUNITY): Payer: Managed Care, Other (non HMO)

## 2014-01-12 ENCOUNTER — Ambulatory Visit: Payer: Managed Care, Other (non HMO) | Admitting: Vascular Surgery

## 2014-01-14 ENCOUNTER — Encounter: Payer: Self-pay | Admitting: Internal Medicine

## 2014-01-14 ENCOUNTER — Ambulatory Visit (INDEPENDENT_AMBULATORY_CARE_PROVIDER_SITE_OTHER): Payer: Medicare HMO | Admitting: Internal Medicine

## 2014-01-14 ENCOUNTER — Other Ambulatory Visit (INDEPENDENT_AMBULATORY_CARE_PROVIDER_SITE_OTHER): Payer: Medicare HMO

## 2014-01-14 VITALS — BP 130/84 | HR 81 | Temp 98.2°F | Resp 16 | Ht 72.0 in | Wt 230.0 lb

## 2014-01-14 DIAGNOSIS — I10 Essential (primary) hypertension: Secondary | ICD-10-CM

## 2014-01-14 DIAGNOSIS — Z23 Encounter for immunization: Secondary | ICD-10-CM

## 2014-01-14 DIAGNOSIS — E118 Type 2 diabetes mellitus with unspecified complications: Secondary | ICD-10-CM

## 2014-01-14 LAB — BASIC METABOLIC PANEL
BUN: 12 mg/dL (ref 6–23)
CALCIUM: 9.6 mg/dL (ref 8.4–10.5)
CO2: 24 mEq/L (ref 19–32)
Chloride: 104 mEq/L (ref 96–112)
Creatinine, Ser: 1 mg/dL (ref 0.4–1.5)
GFR: 74.67 mL/min (ref >60.00)
Glucose, Bld: 108 mg/dL — ABNORMAL HIGH (ref 70–99)
POTASSIUM: 3.7 meq/L (ref 3.5–5.1)
Sodium: 137 mEq/L (ref 135–145)

## 2014-01-14 MED ORDER — PNEUMOCOCCAL 13-VAL CONJ VACC IM SUSP: 0.5000 mL | INTRAMUSCULAR | Status: AC

## 2014-01-14 NOTE — Patient Instructions (Signed)

## 2014-01-14 NOTE — Progress Notes (Signed)
Subjective:    Patient ID: Alfred Armstrong, male    DOB: 1931-11-01, 80 y.o.   MRN: [id number]  Diabetes He presents for his follow-up diabetic visit. He has type 2 diabetes mellitus. His disease course has been stable. There are no hypoglycemic associated symptoms. Associated symptoms include foot paresthesias. Pertinent negatives for diabetes include no blurred vision, no chest pain, no fatigue, no foot ulcerations, no polydipsia, no polyphagia, no polyuria, no weakness and no weight loss. There are no hypoglycemic complications. Symptoms are stable. Diabetic complications include a CVA, heart disease and peripheral neuropathy. Current diabetic treatment includes diet. He is compliant with treatment most of the time. His weight is stable. He is following a generally healthy diet. Meal planning includes avoidance of concentrated sweets. He never participates in exercise. There is no change in his home blood glucose trend. An ACE inhibitor/angiotensin II receptor blocker is being taken. He sees a podiatrist.Eye exam is not current.      Review of Systems  Constitutional: Negative.  Negative for fever, chills, weight loss, diaphoresis, appetite change and fatigue.  HENT: Negative.   Eyes: Negative.  Negative for blurred vision.  Respiratory: Negative.  Negative for cough, choking, chest tightness, shortness of breath and stridor.   Cardiovascular: Negative.  Negative for chest pain, palpitations and leg swelling.  Gastrointestinal: Negative.  Negative for nausea, vomiting, abdominal pain, diarrhea, constipation and blood in stool.  Endocrine: Negative.  Negative for polydipsia, polyphagia and polyuria.  Genitourinary: Negative.   Musculoskeletal: Positive for arthralgias. Negative for myalgias, back pain, joint swelling and neck stiffness.  Skin: Negative.  Negative for rash.  Allergic/Immunologic: Negative.   Neurological: Negative.  Negative for weakness.  Hematological: Negative.  Negative  for adenopathy. Does not bruise/bleed easily.  Psychiatric/Behavioral: Negative.        Objective:   Physical Exam  Constitutional: He is oriented to person, place, and time. He appears well-developed and well-nourished. No distress.  HENT:  Head: Normocephalic and atraumatic.  Mouth/Throat: Oropharynx is clear and moist. No oropharyngeal exudate.  Eyes: Conjunctivae are normal. Right eye exhibits no discharge. Left eye exhibits no discharge. No scleral icterus.  Neck: Normal range of motion. Neck supple. No JVD present. No tracheal deviation present. No thyromegaly present.  Cardiovascular: Normal rate, regular rhythm, normal heart sounds and intact distal pulses.  Exam reveals no gallop and no friction rub.   No murmur heard. Pulmonary/Chest: Effort normal and breath sounds normal. No stridor. No respiratory distress. He has no wheezes. He has no rales. He exhibits no tenderness.  Abdominal: Soft. Bowel sounds are normal. He exhibits no distension and no mass. There is no tenderness. There is no rebound and no guarding.  Musculoskeletal: Normal range of motion. He exhibits no edema or tenderness.  Lymphadenopathy:    He has no cervical adenopathy.  Neurological: He is oriented to person, place, and time.  Skin: Skin is warm and dry. No rash noted. He is not diaphoretic. No erythema. No pallor.  Psychiatric: He has a normal mood and affect. His behavior is normal. Judgment and thought content normal.  Vitals reviewed.     Lab Results  Component Value Date   WBC 8.1 04/28/2013   HGB 14.1 04/28/2013   HCT 41.9 04/28/2013   PLT 243.0 04/28/2013   GLUCOSE 118* 09/10/2013   CHOL 129 09/10/2013   TRIG 148.0 09/10/2013   HDL 47.50 09/10/2013   LDLCALC 52 09/10/2013   ALT 28 08/24/2012   AST 24 08/24/2012  NA 137 09/10/2013   K 4.4 09/10/2013   CL 104 09/10/2013   CREATININE 1.0 09/10/2013   BUN 13 09/10/2013   CO2 25 09/10/2013   TSH 0.65 08/24/2012   PSA 3.05 12/14/2010    INR 1.0 09/09/2012   HGBA1C 6.9* 09/10/2013      Assessment & Plan:

## 2014-01-17 ENCOUNTER — Encounter: Payer: Self-pay | Admitting: Internal Medicine

## 2014-01-17 LAB — URINALYSIS, ROUTINE W REFLEX MICROSCOPIC
Bilirubin Urine: NEGATIVE
Hgb urine dipstick: NEGATIVE
KETONES UR: NEGATIVE
Leukocytes, UA: NEGATIVE
NITRITE: NEGATIVE
PH: 5.5 (ref 5.0–8.0)
SPECIFIC GRAVITY, URINE: 1.015 (ref 1.000–1.030)
Total Protein, Urine: NEGATIVE
Urine Glucose: NEGATIVE
Urobilinogen, UA: 0.2 (ref 0.0–1.0)

## 2014-01-17 LAB — MICROALBUMIN / CREATININE URINE RATIO
Creatinine,U: 75.6 mg/dL
MICROALB/CREAT RATIO: 1.2 mg/g (ref 0.0–30.0)
Microalb, Ur: 0.9 mg/dL (ref 0.0–1.9)

## 2014-01-17 NOTE — Assessment & Plan Note (Signed)
His BP is well controlled Will monitor his lytes and renal function 

## 2014-01-17 NOTE — Assessment & Plan Note (Signed)
He is due for an A1C, will address if needed He is also due for an eye exam - referral sent

## 2014-01-18 ENCOUNTER — Telehealth: Payer: Self-pay | Admitting: Internal Medicine

## 2014-01-18 NOTE — Telephone Encounter (Signed)
Error/gd °

## 2014-01-19 ENCOUNTER — Ambulatory Visit: Payer: Managed Care, Other (non HMO) | Admitting: Vascular Surgery

## 2014-02-03 ENCOUNTER — Encounter: Payer: Self-pay | Admitting: Cardiology

## 2014-02-03 ENCOUNTER — Ambulatory Visit (INDEPENDENT_AMBULATORY_CARE_PROVIDER_SITE_OTHER): Payer: Medicare HMO | Admitting: Cardiology

## 2014-02-03 VITALS — BP 122/78 | HR 69 | Ht >= 80 in | Wt 229.0 lb

## 2014-02-03 DIAGNOSIS — E785 Hyperlipidemia, unspecified: Secondary | ICD-10-CM

## 2014-02-03 DIAGNOSIS — I25810 Atherosclerosis of coronary artery bypass graft(s) without angina pectoris: Secondary | ICD-10-CM

## 2014-02-03 NOTE — Patient Instructions (Signed)
Your physician wants you to follow-up in: 1 year with Dr McLean. (January 2017). You will receive a reminder letter in the mail two months in advance. If you don't receive a letter, please call our office to schedule the follow-up appointment.  

## 2014-02-05 NOTE — Progress Notes (Signed)
Patient ID: Alfred Armstrong, male   DOB: Apr 14, 1931, 81 y.o.   MRN: [id number] PCP: Dr. Ronnald Ramp  81 yo with history of CAD s/p CABG, carotid stenosis, and COPD presents for cardiology followup.  He had a myoview suggesting ischemia in 5/10 followed by catheterization showing patent LIMA and SVG-PLV. There was a chronic occlusion of the free radial to diagonal graft. There was no evidence by catheterization for lesion causing significant ischemia. He sees pulmonology for COPD. Echo in 7/11 showed preserved LV systolic function with basal inferoposterior akinesis.  He had right CEA in 6/14.  Given increased exertional dyspnea, I had him do a Cardiolite, which showed a medium-sized anterolateral partially reversible defect.  Therefore, LHC was done again, showing stable anatomy compared to prior cath.   Symptoms are stable.  He can walk on flat ground without problem.  Minimal dyspnea walking up a flight of steps. He is not exercising as much as in the past.  No chest pain.  No orthopnea or PND.    Labs (1/11): BNP 57, TSH normal, LDL 98, HDL 49  Labs (12/12): K 4.4, creatinine 1.05 Labs (1/13): LDL 48, HDL 51 Labs (3/13): K 4.5, creatinine 1.1 Labs (10/13): K 4.1, creatinine 1.0, LDL 65, HDL 53 Labs (7/14): LDL 79, HDL 51, K 4.4, creatinine 1.1, BNP 116 Labs (8/14): K 4.4, creatinine 1.1 Labs (8/15): LDL 52 Labs (12/15): K 3.7, creatinine 1.0  Allergies (verified):  No Known Drug Allergies   Past Medical History:  1. CAD, ARTERY BYPASS GRAFT: s/p CABG in [place] in 1989. Had myoview in 5/10 showing EF 50% and apical ischemia. LHC was done in 5/10 showing 90% pLAD, 90% mLAD, 60% mCFX, 70% pRCA, 70% mRCA, 95% dRCA, SVG-PLV patent, LIMA-LAD patent, no other grafts found by aortic root shot. Free radial-D presumed occluded.  Echo (5/11): EF 55%, mild LVH, basal inferoposterior akinesis, normal RV.  Cardiolite (8/14) with EF 49% and partially reversible anterolateral defect suggestive of infarction  with peri-infarct ischemia. LHC (8/14) with patent LIMA-LAD and SVG-PLV, while radial-D1 was occluded (known from prior), TO mRCA with PDA filling by collaterals. 2. COPD: PFTs 9/10 with FVC 65%, FEV1 57%. 3. OSTEOARTHRITIS 4. NEPHROLITHIASIS 5. GERD  6. Tubulovillous adenomatous colon polyps with HGD 1999  7. Hyperlipidemia  8. Carotid stenosis: Followed by Dr. Scot Dock. Angiogram 5/10 showed left common carotid to be totally occluded and 50% RICA stenosis.  Right CEA 6/14.  9. HTN: ACEI cough  10. CVA  11. Shingles  12. MGUS: Followed by Dr. Lamonte Sakai.  92. Type II diabetes: diet-controlled.   Family History:  father died of a self-inflicted wound age 79, history of EtOH  mother died age 79 history of EtOH  One sister is well   Social History:   Retired  Single  former [employer] company in [employer]  Regular exercise-yes  Alcohol use-no  Quit smoking around 20 years ago.  Alcohol Use - no  Daily Caffeine Use: 2-3 cups weekly   ROS: All systems reviewed and negative except as per HPI.    Current Outpatient Prescriptions  Medication Sig Dispense Refill  . acetaminophen (TYLENOL) 500 MG tablet Take 1,000 mg by mouth 2 (two) times daily as needed for pain.    Marland Kitchen aspirin 81 MG EC tablet Take 81 mg by mouth daily.     . cholecalciferol (VITAMIN D) 1000 UNITS tablet Take 1,000 Units by mouth 2 (two) times daily.    Marland Kitchen losartan (COZAAR) 25  MG tablet Take 1 tablet (25 mg total) by mouth daily. 90 tablet 3  . metoprolol succinate (TOPROL-XL) 50 MG 24 hr tablet Take 1 tablet (50 mg total) by mouth daily. Take with or immediately following a meal. 90 tablet 3  . Multiple Vitamins-Minerals (ONE-A-DAY MENS 50+ ADVANTAGE PO) Take by mouth.    . pregabalin (LYRICA) 50 MG capsule Take 50 mg by mouth 3 (three) times daily as needed (for pain).    . rosuvastatin (CRESTOR) 20 MG tablet Take 1 tablet (20 mg total) by mouth daily. 90 tablet 3  . traMADol-acetaminophen (ULTRACET) 37.5-325 MG  per tablet Take 1 tablet by mouth every 6 (six) hours as needed. 100 tablet 3  . zolpidem (AMBIEN) 10 MG tablet TAKE ONE TABLET BY MOUTH AT BEDTIME  30 tablet 2  . [DISCONTINUED] Hypertonic Nasal Wash (SINUS RINSE BOTTLE KIT NA) by Nasal route as directed.       No current facility-administered medications for this visit.   BP 122/78 mmHg  Pulse 69  Ht 7' (2.134 m)  Wt 229 lb (103.874 kg)  BMI 22.81 kg/m2 General: NAD, overweight Neck: No JVD, no thyromegaly or thyroid nodule.  Lungs: Clear to auscultation bilaterally with normal respiratory effort. CV: Nondisplaced PMI.  Heart regular S1/S2, no S3/S4, no murmur.  Trace left ankle edema (chronic since CABG).  Bilateral carotid bruits.  Normal pedal pulses.  Abdomen: Soft, nontender, no hepatosplenomegaly, no distention.  Neurologic: Alert and oriented x 3.  Psych: Normal affect. Extremities: No clubbing or cyanosis.   Assessment/Plan:  CAD S/p CABG. Last CABG showed patent LIMA-LAD and SVG-PLV while SVG-D1 was occluded (known from prior cath).  Possible sources of ischemia are D1 territory and maybe PDA territory (PDA fills via collaterals).  No chest pain.  - Continue ASA 81, Crestor, losartan, and Toprol XL.   CAROTID ARTERY STENOSIS  Has regular followup at [facility] for significant carotid disease. S/p right CEA in 6/14.  HYPERLIPIDEMIA-MIXED  Good LDL when recently checked in 8/15.   Loralie Champagne 02/05/2014

## 2014-02-08 ENCOUNTER — Telehealth: Payer: Self-pay | Admitting: Hematology and Oncology

## 2014-02-08 ENCOUNTER — Encounter: Payer: Self-pay | Admitting: Vascular Surgery

## 2014-02-08 NOTE — Telephone Encounter (Signed)
lvm for pt regarding to May 23 appt moved to 5.20 per MD on pal

## 2014-02-09 ENCOUNTER — Ambulatory Visit (INDEPENDENT_AMBULATORY_CARE_PROVIDER_SITE_OTHER): Payer: Medicare HMO | Admitting: Vascular Surgery

## 2014-02-09 ENCOUNTER — Ambulatory Visit (HOSPITAL_COMMUNITY)
Admission: RE | Admit: 2014-02-09 | Discharge: 2014-02-09 | Disposition: A | Discharge status: Home / Self Care | Payer: Medicare HMO | Source: Ambulatory Visit | Attending: Vascular Surgery | Admitting: Vascular Surgery

## 2014-02-09 ENCOUNTER — Encounter: Payer: Self-pay | Admitting: Vascular Surgery

## 2014-02-09 VITALS — BP 147/85 | HR 70 | Ht 72.0 in | Wt 225.4 lb

## 2014-02-09 DIAGNOSIS — I6523 Occlusion and stenosis of bilateral carotid arteries: Secondary | ICD-10-CM

## 2014-02-09 DIAGNOSIS — Z48812 Encounter for surgical aftercare following surgery on the circulatory system: Secondary | ICD-10-CM | POA: Diagnosis not present

## 2014-02-09 DIAGNOSIS — I6521 Occlusion and stenosis of right carotid artery: Secondary | ICD-10-CM | POA: Diagnosis present

## 2014-02-09 DIAGNOSIS — I6529 Occlusion and stenosis of unspecified carotid artery: Secondary | ICD-10-CM | POA: Insufficient documentation

## 2014-02-09 NOTE — Progress Notes (Signed)
[facility] [place]  Patient name: Alfred Armstrong MRN: [id number] DOB: 1931-01-31 Sex: male  REASON FOR VISIT: Follow up of carotid disease  HPI: Alfred Armstrong is a 81 y.o. male with a known left internal carotid artery occlusion. He underwent a right carotid endarterectomy in May 2014. He was last seen in our office on 07/14/2013. At that time, he had evidence of recurrent stenosis at the distal end of his carotid patch in the 60-79% range. Peak systolic velocity was 174 cm/s with an end-diastolic velocity of 77 cm/s. Given the timing of the restenosis I felt that this was most likely related to intimal hyperplasia. He comes in for 6 month follow up study.  Since I saw him last, he denies any history of stroke, TIAs, expressive or receptive aphasia, or amaurosis fugax.  He is on aspirin and is on a statin.   Past Medical History  Diagnosis Date  . COPD (chronic obstructive pulmonary disease)   . Osteoarthrosis, unspecified whether generalized or localized, unspecified site   . History of nephrolithiasis   . GERD (gastroesophageal reflux disease)   . Tubulovillous adenoma polyp of colon     w/HGD 1999  . Hyperlipidemia   . Carotid stenosis     Followed by Dr Amedeo Plenty - Angiogram 5/10 showed left common carotid to be totally occluded and 50% RICA stenosis  . HTN (hypertension)     ACEI Cough  . CVA (cerebral infarction)   . Shingles   . MGUS (monoclonal gammopathy of unknown significance) 06/17/2011  . CAD (coronary artery disease)     Dr. Aundra Dubin  . Anxiety     related to hosp. enviromental   . Shortness of breath     uses symbicort daily, when taking the garbage   . H/O hiatal hernia   . Headache(784.0)     annoyance related to enviromental allergies   . Neuromuscular disorder     neuropathy  . Stroke   . Diabetes mellitus without complication   . Cancer     skin   Family History  Problem Relation Age of Onset  . Alcohol abuse Mother   .  Alcohol abuse Father   . Colon cancer Neg Hx    SOCIAL HISTORY: History  Substance Use Topics  . Smoking status: Former Smoker    Types: Cigarettes    Quit date: 01/28/1993  . Smokeless tobacco: Never Used  . Alcohol Use: No     Comment: stopped drinking many yrs. ago    Allergies  Allergen Reactions  . Tiotropium Bromide Monohydrate     Dry mouth  . Daliresp [Roflumilast]     Bad thoughts/dreams   Current Outpatient Prescriptions  Medication Sig Dispense Refill  . acetaminophen (TYLENOL) 500 MG tablet Take 1,000 mg by mouth 2 (two) times daily as needed for pain.    Marland Kitchen aspirin 81 MG EC tablet Take 81 mg by mouth daily.     . cholecalciferol (VITAMIN D) 1000 UNITS tablet Take 1,000 Units by mouth 2 (two) times daily.    Marland Kitchen losartan (COZAAR) 25 MG tablet Take 1 tablet (25 mg total) by mouth daily. 90 tablet 3  . metoprolol succinate (TOPROL-XL) 50 MG 24 hr tablet Take 1 tablet (50 mg total) by mouth daily. Take with or immediately following a meal. 90 tablet 3  . Multiple Vitamins-Minerals (ONE-A-DAY MENS 50+ ADVANTAGE PO) Take by mouth.    . pregabalin (LYRICA) 50 MG capsule Take 50 mg by  mouth 3 (three) times daily as needed (for pain).    . rosuvastatin (CRESTOR) 20 MG tablet Take 1 tablet (20 mg total) by mouth daily. 90 tablet 3  . traMADol-acetaminophen (ULTRACET) 37.5-325 MG per tablet Take 1 tablet by mouth every 6 (six) hours as needed. 100 tablet 3  . zolpidem (AMBIEN) 10 MG tablet TAKE ONE TABLET BY MOUTH AT BEDTIME  30 tablet 2  . [DISCONTINUED] Hypertonic Nasal Wash (SINUS RINSE BOTTLE KIT NA) by Nasal route as directed.       No current facility-administered medications for this visit.   REVIEW OF SYSTEMS: Valu.Nieves ] denotes positive finding; [  ] denotes negative finding  CARDIOVASCULAR:  _0  chest pain   _1  chest pressure   _2  palpitations   _3  orthopnea   _4  dyspnea on exertion   _5  claudication   _6  rest pain   _7  DVT   _8  phlebitis PULMONARY:   _9  productive  cough   _10  asthma   _11  wheezing NEUROLOGIC:   _12  weakness  _13  paresthesias  _14  aphasia  _15  amaurosis  _16  dizziness HEMATOLOGIC:   _17  bleeding problems   _18  clotting disorders MUSCULOSKELETAL:  _19  joint pain   _20  joint swelling _21  leg swelling GASTROINTESTINAL: _22   blood in stool  _23   hematemesis GENITOURINARY:  _24   dysuria  _25   hematuria PSYCHIATRIC:  _26  history of major depression INTEGUMENTARY:  _27  rashes  _28  ulcers CONSTITUTIONAL:  _29  fever   _30  chills  PHYSICAL EXAM: Filed Vitals:   02/09/14 1343 02/09/14 1344  BP: 138/82 147/85  Pulse: 70   Height: 6' (1.829 m)   Weight: 225 lb 6.4 oz (102.241 kg)   SpO2: 96%    Body mass index is 30.56 kg/(m^2). GENERAL: The patient is a well-nourished male, in no acute distress. The vital signs are documented above. CARDIOVASCULAR: There is a regular rate and rhythm. I do not detect carotid bruits. PULMONARY: There is good air exchange bilaterally without wheezing or rales. ABDOMEN: Soft and non-tender with normal pitched bowel sounds.  MUSCULOSKELETAL: There are no major deformities or cyanosis. NEUROLOGIC: No focal weakness or paresthesias are detected. SKIN: There are no ulcers or rashes noted. PSYCHIATRIC: The patient has a normal affect.  DATA:  I have independently interpreted his carotid duplex scan. He has a known left internal carotid artery occlusion. On the left side the velocities have improved. Peak systolic velocity is 193 cm/s with an end-diastolic velocity of 69 cm/s. This is still within the 60-79% range.   MEDICAL ISSUES:  Carotid stenosis This patient has a known left internal carotid artery occlusion. He is status post right carotid endarterectomy and has a moderate recurrent stenosis at the distal end of the patch. The velocities have actually improved over the last 6 months although he still in the 60-79% range. He is on aspirin and is on a statin. I encouraged him to stay as active as possible. We  have also discussed the importance of nutrition. Fortunately, he is not a smoker. I have ordered a follow up carotid duplex scan in 6 months and see him back at that time. He knows to call sooner if he has problems.     Return in about 6 months (around 08/10/2014).   [place] [facility] [place] Beeper: [phone]

## 2014-02-09 NOTE — Addendum Note (Signed)
Addended by: Mena Goes on: 02/09/2014 04:17 PM   Modules accepted: Orders

## 2014-02-09 NOTE — Assessment & Plan Note (Signed)
This patient has a known left internal carotid artery occlusion. He is status post right carotid endarterectomy and has a moderate recurrent stenosis at the distal end of the patch. The velocities have actually improved over the last 6 months although he still in the 60-79% range. He is on aspirin and is on a statin. I encouraged him to stay as active as possible. We have also discussed the importance of nutrition. Fortunately, he is not a smoker. I have ordered a follow up carotid duplex scan in 6 months and see him back at that time. He knows to call sooner if he has problems.

## 2014-02-11 ENCOUNTER — Other Ambulatory Visit: Payer: Self-pay | Admitting: Internal Medicine

## 2014-03-16 LAB — HM DIABETES EYE EXAM

## 2014-03-17 ENCOUNTER — Encounter: Payer: Self-pay | Admitting: Internal Medicine

## 2014-03-19 ENCOUNTER — Encounter: Payer: Self-pay | Admitting: Gastroenterology

## 2014-03-21 LAB — HM DIABETES EYE EXAM

## 2014-05-16 ENCOUNTER — Encounter: Payer: Self-pay | Admitting: Internal Medicine

## 2014-05-16 ENCOUNTER — Other Ambulatory Visit (INDEPENDENT_AMBULATORY_CARE_PROVIDER_SITE_OTHER): Payer: Medicare HMO

## 2014-05-16 ENCOUNTER — Ambulatory Visit (INDEPENDENT_AMBULATORY_CARE_PROVIDER_SITE_OTHER): Payer: Medicare HMO | Admitting: Internal Medicine

## 2014-05-16 VITALS — BP 136/86 | HR 83 | Temp 98.3°F | Resp 16 | Wt 226.0 lb

## 2014-05-16 DIAGNOSIS — E118 Type 2 diabetes mellitus with unspecified complications: Secondary | ICD-10-CM

## 2014-05-16 DIAGNOSIS — I10 Essential (primary) hypertension: Secondary | ICD-10-CM | POA: Diagnosis not present

## 2014-05-16 LAB — BASIC METABOLIC PANEL
BUN: 17 mg/dL (ref 6–23)
CHLORIDE: 105 meq/L (ref 96–112)
CO2: 23 mEq/L (ref 19–32)
CREATININE: 1.05 mg/dL (ref 0.40–1.50)
Calcium: 9.8 mg/dL (ref 8.4–10.5)
GFR: 72.15 mL/min (ref >60.00)
Glucose, Bld: 139 mg/dL — ABNORMAL HIGH (ref 70–99)
POTASSIUM: 4.1 meq/L (ref 3.5–5.1)
Sodium: 136 mEq/L (ref 135–145)

## 2014-05-16 LAB — HEMOGLOBIN A1C: Hgb A1c MFr Bld: 6.8 % — ABNORMAL HIGH (ref 4.6–6.5)

## 2014-05-16 NOTE — Progress Notes (Signed)
Pre visit review using our clinic review tool, if applicable. No additional management support is needed unless otherwise documented below in the visit note. 

## 2014-05-16 NOTE — Patient Instructions (Signed)

## 2014-05-17 NOTE — Assessment & Plan Note (Signed)
His A1C is very mildly elevated No meds are needed at this time He will work on his lifestyle modifications

## 2014-05-17 NOTE — Progress Notes (Signed)
Subjective:    Patient ID: Alfred Armstrong, male    DOB: [birth year], 81 y.o.   MRN: [id number]  Hyperlipidemia Pertinent negatives include no chest pain or shortness of breath.  Hypertension This is a chronic problem. The current episode started more than 1 year ago. The problem has been gradually improving since onset. The problem is controlled. Pertinent negatives include no anxiety, blurred vision, chest pain, headaches, malaise/fatigue, neck pain, orthopnea, palpitations, peripheral edema, PND, shortness of breath or sweats. Past treatments include angiotensin blockers and beta blockers. The current treatment provides moderate improvement. Compliance problems include diet and exercise.  Hypertensive end-organ damage includes CAD/MI.  Diabetes Pertinent negatives for hypoglycemia include no dizziness, headaches or sweats. Pertinent negatives for diabetes include no blurred vision, no chest pain and no fatigue.      Review of Systems  Constitutional: Negative.  Negative for fever, chills, malaise/fatigue, diaphoresis, appetite change and fatigue.  HENT: Negative.   Eyes: Negative.  Negative for blurred vision and visual disturbance.  Respiratory: Negative.  Negative for cough, choking, chest tightness, shortness of breath and stridor.   Cardiovascular: Negative.  Negative for chest pain, palpitations, orthopnea, leg swelling and PND.  Gastrointestinal: Negative.  Negative for nausea, vomiting, abdominal pain, diarrhea and constipation.  Endocrine: Negative.   Genitourinary: Negative.   Musculoskeletal: Negative.  Negative for back pain, arthralgias and neck pain.  Skin: Negative.  Negative for rash.  Allergic/Immunologic: Negative.   Neurological: Negative.  Negative for dizziness and headaches.  Hematological: Negative.  Negative for adenopathy.  Psychiatric/Behavioral: Negative.        Objective:   Physical Exam  Constitutional: He is oriented to person, place, and time. He  appears well-developed and well-nourished. No distress.  HENT:  Head: Normocephalic and atraumatic.  Mouth/Throat: Oropharynx is clear and moist. No oropharyngeal exudate.  Eyes: Conjunctivae are normal. Right eye exhibits no discharge. Left eye exhibits no discharge. No scleral icterus.  Neck: Normal range of motion. Neck supple. No JVD present. No tracheal deviation present. No thyromegaly present.  Cardiovascular: Normal rate, regular rhythm, normal heart sounds and intact distal pulses.  Exam reveals no gallop and no friction rub.   No murmur heard. Pulmonary/Chest: Effort normal and breath sounds normal. No stridor. No respiratory distress. He has no wheezes. He has no rales. He exhibits no tenderness.  Abdominal: Soft. Bowel sounds are normal. He exhibits no distension and no mass. There is no tenderness. There is no rebound and no guarding.  Musculoskeletal: Normal range of motion. He exhibits no edema or tenderness.  Lymphadenopathy:    He has no cervical adenopathy.  Neurological: He is oriented to person, place, and time.  Skin: Skin is warm and dry. No rash noted. He is not diaphoretic. No erythema. No pallor.  Vitals reviewed.    Lab Results  Component Value Date   WBC 8.1 04/28/2013   HGB 14.1 04/28/2013   HCT 41.9 04/28/2013   PLT 243.0 04/28/2013   GLUCOSE 139* 05/16/2014   CHOL 129 09/10/2013   TRIG 148.0 09/10/2013   HDL 47.50 09/10/2013   LDLCALC 52 09/10/2013   ALT 28 08/24/2012   AST 24 08/24/2012   NA 136 05/16/2014   K 4.1 05/16/2014   CL 105 05/16/2014   CREATININE 1.05 05/16/2014   BUN 17 05/16/2014   CO2 23 05/16/2014   TSH 0.65 08/24/2012   PSA 3.05 12/14/2010   INR 1.0 09/09/2012   HGBA1C 6.8* 05/16/2014   MICROALBUR 0.9 01/14/2014  Assessment & Plan:

## 2014-05-17 NOTE — Assessment & Plan Note (Signed)
His BP is well controlled Lytes and renal function are stable 

## 2014-06-15 ENCOUNTER — Other Ambulatory Visit (HOSPITAL_BASED_OUTPATIENT_CLINIC_OR_DEPARTMENT_OTHER): Payer: Medicare HMO

## 2014-06-15 DIAGNOSIS — D472 Monoclonal gammopathy: Secondary | ICD-10-CM | POA: Diagnosis not present

## 2014-06-15 LAB — CBC WITH DIFFERENTIAL/PLATELET
BASO%: 1.1 % (ref 0.0–2.0)
BASOS ABS: 0.1 10*3/uL (ref 0.0–0.1)
EOS%: 6.9 % (ref 0.0–7.0)
Eosinophils Absolute: 0.5 10*3/uL (ref 0.0–0.5)
HCT: 40.4 % (ref 38.4–49.9)
HGB: 13.7 g/dL (ref 13.0–17.1)
LYMPH%: 37.9 % (ref 14.0–49.0)
MCH: 30.5 pg (ref 27.2–33.4)
MCHC: 34 g/dL (ref 32.0–36.0)
MCV: 89.6 fL (ref 79.3–98.0)
MONO#: 0.5 10*3/uL (ref 0.1–0.9)
MONO%: 6.5 % (ref 0.0–14.0)
NEUT#: 3.3 10*3/uL (ref 1.5–6.5)
NEUT%: 47.6 % (ref 39.0–75.0)
PLATELETS: 210 10*3/uL (ref 140–400)
RBC: 4.5 10*6/uL (ref 4.20–5.82)
RDW: 13.4 % (ref 11.0–14.6)
WBC: 6.9 10*3/uL (ref 4.0–10.3)
lymph#: 2.6 10*3/uL (ref 0.9–3.3)

## 2014-06-15 LAB — COMPREHENSIVE METABOLIC PANEL (CC13)
ALBUMIN: 3.8 g/dL (ref 3.5–5.0)
ALK PHOS: 56 U/L (ref 40–150)
ALT: 23 U/L (ref 0–55)
AST: 20 U/L (ref 5–34)
Anion Gap: 11 mEq/L (ref 3–11)
BILIRUBIN TOTAL: 0.46 mg/dL (ref 0.20–1.20)
BUN: 11.5 mg/dL (ref 7.0–26.0)
CALCIUM: 9 mg/dL (ref 8.4–10.4)
CO2: 21 meq/L — AB (ref 22–29)
CREATININE: 1.1 mg/dL (ref 0.7–1.3)
Chloride: 110 mEq/L — ABNORMAL HIGH (ref 98–109)
EGFR: 66 mL/min/{1.73_m2} — ABNORMAL LOW (ref >90)
Glucose: 206 mg/dl — ABNORMAL HIGH (ref 70–140)
POTASSIUM: 3.7 meq/L (ref 3.5–5.1)
SODIUM: 141 meq/L (ref 136–145)
Total Protein: 7 g/dL (ref 6.4–8.3)

## 2014-06-17 ENCOUNTER — Encounter: Payer: Self-pay | Admitting: Hematology and Oncology

## 2014-06-17 ENCOUNTER — Telehealth: Payer: Self-pay | Admitting: Hematology and Oncology

## 2014-06-17 ENCOUNTER — Ambulatory Visit (HOSPITAL_BASED_OUTPATIENT_CLINIC_OR_DEPARTMENT_OTHER): Payer: Medicare HMO | Admitting: Hematology and Oncology

## 2014-06-17 VITALS — BP 130/62 | HR 76 | Temp 98.3°F | Resp 18 | Ht 72.0 in | Wt 232.3 lb

## 2014-06-17 DIAGNOSIS — D472 Monoclonal gammopathy: Secondary | ICD-10-CM | POA: Diagnosis not present

## 2014-06-17 LAB — KAPPA/LAMBDA LIGHT CHAINS
KAPPA FREE LGHT CHN: 2 mg/dL — AB (ref 0.33–1.94)
Kappa:Lambda Ratio: 1.33 (ref 0.26–1.65)
Lambda Free Lght Chn: 1.5 mg/dL (ref 0.57–2.63)

## 2014-06-17 LAB — SPEP & IFE WITH QIG
ABNORMAL PROTEIN BAND1: 0.7 g/dL
Albumin ELP: 4.1 g/dL (ref 3.8–4.8)
Alpha-1-Globulin: 0.2 g/dL (ref 0.2–0.3)
Alpha-2-Globulin: 0.7 g/dL (ref 0.5–0.9)
Beta 2: 0.3 g/dL (ref 0.2–0.5)
Beta Globulin: 0.4 g/dL (ref 0.4–0.6)
Gamma Globulin: 1.2 g/dL (ref 0.8–1.7)
IGA: 201 mg/dL (ref 68–379)
IGG (IMMUNOGLOBIN G), SERUM: 1450 mg/dL (ref 650–1600)
IgM, Serum: 40 mg/dL — ABNORMAL LOW (ref 41–251)
TOTAL PROTEIN, SERUM ELECTROPHOR: 7 g/dL (ref 6.1–8.1)

## 2014-06-17 NOTE — Telephone Encounter (Signed)
Gave and printed appt sched appt sched and avs for pt for May 2016

## 2014-06-17 NOTE — Progress Notes (Signed)
[place] OFFICE PROGRESS NOTE  Patient Care Team: Janith Lima, MD as PCP - General Nobie Putnam, MD (Hematology and Oncology) Larey Dresser, MD (Cardiology)  SUMMARY OF ONCOLOGIC HISTORY:  CHIEF COMPLAINTS/PURPOSE OF VISIT:  IgG kappa MGUS, on observation Alfred Armstrong was transferred to my care after his prior physician has left.  I reviewed the patient's records extensive and collaborated the history with the patient. Summary of his history is as follows: This patient was told to have abnormal blood work and was subsequently found to have monoclonal gammopathy of unknown significance. The patient have chronic arthritis. Denies atypical bone fracture. Blood work, urine test and skeletal survey showed no evidence of anemia, renal failure, bone lesions or hypercalcemia. He was placed on observation. INTERVAL HISTORY: Please see below for problem oriented charting.  he feels well. Denies recurrent infection. No new bone pain.  REVIEW OF SYSTEMS:   Constitutional: Denies fevers, chills or abnormal weight loss Eyes: Denies blurriness of vision Ears, nose, mouth, throat, and face: Denies mucositis or sore throat Respiratory: Denies cough, dyspnea or wheezes Cardiovascular: Denies palpitation, chest discomfort or lower extremity swelling Gastrointestinal:  Denies nausea, heartburn or change in bowel habits Skin: Denies abnormal skin rashes Lymphatics: Denies new lymphadenopathy or easy bruising Neurological:Denies numbness, tingling or new weaknesses Behavioral/Psych: Mood is stable, no new changes  All other systems were reviewed with the patient and are negative.  I have reviewed the past medical history, past surgical history, social history and family history with the patient and they are unchanged from previous note.  ALLERGIES:  is allergic to tiotropium bromide monohydrate and daliresp.  MEDICATIONS:  Current Outpatient Prescriptions  Medication Sig  Dispense Refill  . acetaminophen (TYLENOL) 500 MG tablet Take 1,000 mg by mouth 2 (two) times daily as needed for pain.    Marland Kitchen aspirin 81 MG EC tablet Take 81 mg by mouth daily.     . cholecalciferol (VITAMIN D) 1000 UNITS tablet Take 1,000 Units by mouth 2 (two) times daily.    Marland Kitchen losartan (COZAAR) 25 MG tablet Take 1 tablet (25 mg total) by mouth daily. 90 tablet 3  . metoprolol succinate (TOPROL-XL) 50 MG 24 hr tablet Take 1 tablet (50 mg total) by mouth daily. Take with or immediately following a meal. 90 tablet 3  . Multiple Vitamins-Minerals (ONE-A-DAY MENS 50+ ADVANTAGE PO) Take by mouth.    . pregabalin (LYRICA) 50 MG capsule Take 50 mg by mouth 3 (three) times daily as needed (for pain).    . rosuvastatin (CRESTOR) 20 MG tablet Take 1 tablet (20 mg total) by mouth daily. 90 tablet 3  . zolpidem (AMBIEN) 10 MG tablet TAKE ONE TABLET BY MOUTH AT BEDTIME  30 tablet 5  . [DISCONTINUED] Hypertonic Nasal Wash (SINUS RINSE BOTTLE KIT NA) by Nasal route as directed.       No current facility-administered medications for this visit.    PHYSICAL EXAMINATION: ECOG PERFORMANCE STATUS: 0 - Asymptomatic  Filed Vitals:   06/17/14 1415  BP: 130/62  Pulse: 76  Temp: 98.3 F (36.8 C)  Resp: 18   Filed Weights   06/17/14 1415  Weight: 232 lb 4.8 oz (105.371 kg)    GENERAL:alert, no distress and comfortable SKIN: skin color, texture, turgor are normal, no rashes or significant lesions EYES: normal, Conjunctiva are pink and non-injected, sclera clear OROPHARYNX:no exudate, no erythema and lips, buccal mucosa, and tongue normal  NECK: supple, thyroid normal size, non-tender, without nodularity  LYMPH:  no palpable lymphadenopathy in the cervical, axillary or inguinal LUNGS: clear to auscultation and percussion with normal breathing effort HEART: regular rate & rhythm and no murmurs and no lower extremity edema ABDOMEN:abdomen soft, non-tender and normal bowel sounds Musculoskeletal:no cyanosis  of digits and no clubbing  NEURO: alert & oriented x 3 with fluent speech, no focal motor/sensory deficits  LABORATORY DATA:  I have reviewed the data as listed    Component Value Date/Time   NA 141 06/15/2014 1323   NA 136 05/16/2014 1508   K 3.7 06/15/2014 1323   K 4.1 05/16/2014 1508   CL 105 05/16/2014 1508   CL 106 06/10/2012 1246   CO2 21* 06/15/2014 1323   CO2 23 05/16/2014 1508   GLUCOSE 206* 06/15/2014 1323   GLUCOSE 139* 05/16/2014 1508   GLUCOSE 132* 06/10/2012 1246   BUN 11.5 06/15/2014 1323   BUN 17 05/16/2014 1508   CREATININE 1.1 06/15/2014 1323   CREATININE 1.05 05/16/2014 1508   CALCIUM 9.0 06/15/2014 1323   CALCIUM 9.8 05/16/2014 1508   PROT 7.0 06/15/2014 1323   PROT 7.8 08/24/2012 1406   ALBUMIN 3.8 06/15/2014 1323   ALBUMIN 4.1 08/24/2012 1406   AST 20 06/15/2014 1323   AST 24 08/24/2012 1406   ALT 23 06/15/2014 1323   ALT 28 08/24/2012 1406   ALKPHOS 56 06/15/2014 1323   ALKPHOS 45 08/24/2012 1406   BILITOT 0.46 06/15/2014 1323   BILITOT 0.7 08/24/2012 1406   GFRNONAA 58* 06/19/2012 0330   GFRAA 68* 06/19/2012 0330    No results found for: SPEP, UPEP  Lab Results  Component Value Date   WBC 6.9 06/15/2014   NEUTROABS 3.3 06/15/2014   HGB 13.7 06/15/2014   HCT 40.4 06/15/2014   MCV 89.6 06/15/2014   PLT 210 06/15/2014      Chemistry      Component Value Date/Time   NA 141 06/15/2014 1323   NA 136 05/16/2014 1508   K 3.7 06/15/2014 1323   K 4.1 05/16/2014 1508   CL 105 05/16/2014 1508   CL 106 06/10/2012 1246   CO2 21* 06/15/2014 1323   CO2 23 05/16/2014 1508   BUN 11.5 06/15/2014 1323   BUN 17 05/16/2014 1508   CREATININE 1.1 06/15/2014 1323   CREATININE 1.05 05/16/2014 1508      Component Value Date/Time   CALCIUM 9.0 06/15/2014 1323   CALCIUM 9.8 05/16/2014 1508   ALKPHOS 56 06/15/2014 1323   ALKPHOS 45 08/24/2012 1406   AST 20 06/15/2014 1323   AST 24 08/24/2012 1406   ALT 23 06/15/2014 1323   ALT 28 08/24/2012 1406    BILITOT 0.46 06/15/2014 1323   BILITOT 0.7 08/24/2012 1406      ASSESSMENT & PLAN:  MGUS (monoclonal gammopathy of unknown significance)  Clinically, he has no signs of organ damage. Serum protein electrophoresis and M spike are not back but his blood work and complete metabolic panel show no evidence of anemia, hypercalcemia or renal failure. I will see him on a yearly basis with repeat blood work and imaging study to be done a week ahead of time. I continue to recommend vitamin D supplement    Orders Placed This Encounter  Procedures  . DG Bone Survey Met    Standing Status: Future     Number of Occurrences:      Standing Expiration Date: 08/17/2015    Order Specific Question:  Reason for Exam (SYMPTOM  OR DIAGNOSIS REQUIRED)  Answer:  staging myeloma    Order Specific Question:  Preferred imaging location?    Answer:  [facility]  . CBC with Differential/Platelet    Standing Status: Future     Number of Occurrences:      Standing Expiration Date: 08/17/2015  . Comprehensive metabolic panel    Standing Status: Future     Number of Occurrences:      Standing Expiration Date: 08/17/2015  . Lactate dehydrogenase    Standing Status: Future     Number of Occurrences:      Standing Expiration Date: 08/17/2015  . SPEP & IFE with QIG    Standing Status: Future     Number of Occurrences:      Standing Expiration Date: 08/17/2015  . Kappa/lambda light chains    Standing Status: Future     Number of Occurrences:      Standing Expiration Date: 08/17/2015  . Beta 2 microglobulin, serum    Standing Status: Future     Number of Occurrences:      Standing Expiration Date: 08/17/2015   All questions were answered. The patient knows to call the clinic with any problems, questions or concerns. No barriers to learning was detected. I spent 15 minutes counseling the patient face to face. The total time spent in the appointment was 20 minutes and more than 50% was on counseling  and review of test results     [facility], [place], MD 06/17/2014 2:25 PM

## 2014-06-17 NOTE — Assessment & Plan Note (Addendum)
Clinically, he has no signs of organ damage. Serum protein electrophoresis and M spike are not back but his blood work and complete metabolic panel show no evidence of anemia, hypercalcemia or renal failure. I will see him on a yearly basis with repeat blood work and imaging study to be done a week ahead of time. I continue to recommend vitamin D supplement

## 2014-06-20 ENCOUNTER — Ambulatory Visit: Payer: Managed Care, Other (non HMO) | Admitting: Hematology and Oncology

## 2014-06-29 ENCOUNTER — Encounter: Payer: Self-pay | Admitting: Gastroenterology

## 2014-07-10 ENCOUNTER — Other Ambulatory Visit: Payer: Self-pay | Admitting: Internal Medicine

## 2014-07-25 ENCOUNTER — Other Ambulatory Visit: Payer: Self-pay

## 2014-08-04 ENCOUNTER — Other Ambulatory Visit: Payer: Self-pay | Admitting: Internal Medicine

## 2014-08-15 ENCOUNTER — Encounter: Payer: Self-pay | Admitting: Vascular Surgery

## 2014-08-16 ENCOUNTER — Other Ambulatory Visit: Payer: Self-pay | Admitting: Vascular Surgery

## 2014-08-16 DIAGNOSIS — Z48812 Encounter for surgical aftercare following surgery on the circulatory system: Secondary | ICD-10-CM

## 2014-08-16 DIAGNOSIS — I6523 Occlusion and stenosis of bilateral carotid arteries: Secondary | ICD-10-CM

## 2014-08-17 ENCOUNTER — Ambulatory Visit: Payer: Medicare HMO | Admitting: Vascular Surgery

## 2014-08-17 ENCOUNTER — Encounter (HOSPITAL_COMMUNITY): Payer: Medicare HMO

## 2014-09-05 ENCOUNTER — Ambulatory Visit (INDEPENDENT_AMBULATORY_CARE_PROVIDER_SITE_OTHER): Payer: Medicare HMO | Admitting: Internal Medicine

## 2014-09-05 ENCOUNTER — Encounter: Payer: Self-pay | Admitting: Internal Medicine

## 2014-09-05 ENCOUNTER — Other Ambulatory Visit (INDEPENDENT_AMBULATORY_CARE_PROVIDER_SITE_OTHER): Payer: Medicare HMO

## 2014-09-05 VITALS — BP 152/80 | HR 64 | Temp 97.8°F | Resp 16 | Ht 72.0 in | Wt 229.0 lb

## 2014-09-05 DIAGNOSIS — IMO0002 Reserved for concepts with insufficient information to code with codable children: Secondary | ICD-10-CM

## 2014-09-05 DIAGNOSIS — I739 Peripheral vascular disease, unspecified: Secondary | ICD-10-CM | POA: Diagnosis not present

## 2014-09-05 DIAGNOSIS — N410 Acute prostatitis: Secondary | ICD-10-CM

## 2014-09-05 DIAGNOSIS — E785 Hyperlipidemia, unspecified: Secondary | ICD-10-CM

## 2014-09-05 DIAGNOSIS — E1141 Type 2 diabetes mellitus with diabetic mononeuropathy: Secondary | ICD-10-CM

## 2014-09-05 DIAGNOSIS — E1165 Type 2 diabetes mellitus with hyperglycemia: Secondary | ICD-10-CM

## 2014-09-05 DIAGNOSIS — E1149 Type 2 diabetes mellitus with other diabetic neurological complication: Secondary | ICD-10-CM

## 2014-09-05 LAB — URINALYSIS, ROUTINE W REFLEX MICROSCOPIC
BILIRUBIN URINE: NEGATIVE
HGB URINE DIPSTICK: NEGATIVE
Ketones, ur: NEGATIVE
Leukocytes, UA: NEGATIVE
NITRITE: NEGATIVE
PH: 5.5 (ref 5.0–8.0)
RBC / HPF: NONE SEEN (ref 0–?)
Specific Gravity, Urine: 1.02 (ref 1.000–1.030)
Total Protein, Urine: NEGATIVE
UROBILINOGEN UA: 0.2 (ref 0.0–1.0)
Urine Glucose: NEGATIVE
WBC, UA: NONE SEEN (ref 0–?)

## 2014-09-05 MED ORDER — ROSUVASTATIN CALCIUM 20 MG PO TABS: 20.0000 mg | ORAL_TABLET | Freq: Every day | ORAL | Status: DC

## 2014-09-05 NOTE — Progress Notes (Signed)
Pre visit review using our clinic review tool, if applicable. No additional management support is needed unless otherwise documented below in the visit note. 

## 2014-09-05 NOTE — Patient Instructions (Signed)

## 2014-09-05 NOTE — Progress Notes (Signed)
Subjective:  Patient ID: Alfred Armstrong, male    DOB: 10-25-1931  Age: 81 y.o. MRN: [id number]  CC: Hematuria   HPI Alfred Armstrong presents for a 1 week history of pelvic pain and dysuria. He tells me that he was seen at an urgent care center over the weekend and that his urinalysis was positive for blood. He has taken ciprofloxacin for a few days and is starting to feel better. The pelvic pain has resolved.  Outpatient Prescriptions Prior to Visit  Medication Sig Dispense Refill  . acetaminophen (TYLENOL) 500 MG tablet Take 1,000 mg by mouth 2 (two) times daily as needed for pain.    Marland Kitchen aspirin 81 MG EC tablet Take 81 mg by mouth daily.     . cholecalciferol (VITAMIN D) 1000 UNITS tablet Take 1,000 Units by mouth 2 (two) times daily.    Marland Kitchen losartan (COZAAR) 25 MG tablet Take 1 tablet (25 mg total) by mouth daily. 90 tablet 3  . metoprolol succinate (TOPROL-XL) 50 MG 24 hr tablet Take 1 tablet (50 mg total) by mouth daily. Take with or immediately following a meal. 90 tablet 3  . Multiple Vitamins-Minerals (ONE-A-DAY MENS 50+ ADVANTAGE PO) Take by mouth.    . pregabalin (LYRICA) 50 MG capsule Take 50 mg by mouth 3 (three) times daily as needed (for pain).    Marland Kitchen zolpidem (AMBIEN) 10 MG tablet TAKE 1 TABLET BY MOUTH EVERY NIGHT AT BEDTIME 30 tablet 3  . rosuvastatin (CRESTOR) 20 MG tablet Take 1 tablet (20 mg total) by mouth daily. 90 tablet 3   No facility-administered medications prior to visit.    ROS Review of Systems  Constitutional: Negative.  Negative for fever, chills, diaphoresis, appetite change and fatigue.  HENT: Negative.   Eyes: Negative.   Respiratory: Negative.  Negative for cough, choking, chest tightness, shortness of breath and stridor.   Cardiovascular: Negative.  Negative for chest pain, palpitations and leg swelling.  Gastrointestinal: Negative.  Negative for nausea, vomiting, abdominal pain, diarrhea, constipation and blood in stool.  Endocrine: Negative.     Genitourinary: Positive for dysuria and difficulty urinating. Negative for urgency, frequency, hematuria, flank pain, decreased urine volume, discharge, penile swelling, scrotal swelling, enuresis, penile pain and testicular pain.  Musculoskeletal: Negative.   Skin: Negative.  Negative for rash.  Allergic/Immunologic: Negative.   Neurological: Negative.  Negative for dizziness.  Hematological: Negative.  Negative for adenopathy. Does not bruise/bleed easily.  Psychiatric/Behavioral: Negative.     Objective:  BP 152/80 mmHg  Pulse 64  Temp(Src) 97.8 F (36.6 C) (Oral)  Resp 16  Ht 6' (1.829 m)  Wt 229 lb (103.874 kg)  BMI 31.05 kg/m2  SpO2 95%  BP Readings from Last 3 Encounters:  09/05/14 152/80  06/17/14 130/62  05/16/14 136/86    Wt Readings from Last 3 Encounters:  09/05/14 229 lb (103.874 kg)  06/17/14 232 lb 4.8 oz (105.371 kg)  05/16/14 226 lb (102.513 kg)    Physical Exam  Constitutional: He appears well-developed and well-nourished. No distress.  HENT:  Mouth/Throat: Oropharynx is clear and moist. No oropharyngeal exudate.  Eyes: Conjunctivae are normal. Right eye exhibits no discharge. Left eye exhibits no discharge. No scleral icterus.  Neck: Normal range of motion. Neck supple. No JVD present. No tracheal deviation present. No thyromegaly present.  Cardiovascular: Normal rate, regular rhythm, normal heart sounds and intact distal pulses.  Exam reveals no gallop and no friction rub.   No murmur heard. Pulmonary/Chest: Effort normal  and breath sounds normal. No stridor. No respiratory distress. He has no wheezes. He has no rales. He exhibits no tenderness.  Abdominal: Soft. Bowel sounds are normal. He exhibits no distension and no mass. There is no tenderness. There is no rebound and no guarding. Hernia confirmed negative in the right inguinal area and confirmed negative in the left inguinal area.  Genitourinary: Rectum normal, testes normal and penis normal.  Rectal exam shows no external hemorrhoid, no internal hemorrhoid, no fissure, no mass, no tenderness and anal tone normal. Guaiac negative stool. Prostate is enlarged (2+ BPH with bogginess) and tender. Right testis shows no mass, no swelling and no tenderness. Right testis is descended. Left testis shows no mass, no swelling and no tenderness. Left testis is descended. Uncircumcised. No phimosis, paraphimosis, hypospadias, penile erythema or penile tenderness. No discharge found.  Lymphadenopathy:    He has no cervical adenopathy.       Right: No inguinal adenopathy present.       Left: No inguinal adenopathy present.  Skin: He is not diaphoretic.    Lab Results  Component Value Date   WBC 6.9 06/15/2014   HGB 13.7 06/15/2014   HCT 40.4 06/15/2014   PLT 210 06/15/2014   GLUCOSE 206* 06/15/2014   CHOL 129 09/10/2013   TRIG 148.0 09/10/2013   HDL 47.50 09/10/2013   LDLCALC 52 09/10/2013   ALT 23 06/15/2014   AST 20 06/15/2014   NA 141 06/15/2014   K 3.7 06/15/2014   CL 105 05/16/2014   CREATININE 1.1 06/15/2014   BUN 11.5 06/15/2014   CO2 21* 06/15/2014   TSH 0.65 08/24/2012   PSA 3.05 12/14/2010   INR 1.0 09/09/2012   HGBA1C 6.8* 05/16/2014   MICROALBUR 0.9 01/14/2014    No results found.  Assessment & Plan:   Alfred Armstrong was seen today for hematuria.  Diagnoses and all orders for this visit:  Prostatitis, acute- his urinalysis today is normal. His symptoms and exam are consistent with acute prostatitis. He is improving on ciprofloxacin so I will continue that. Orders: -     POCT Urinalysis Dipstick -     Urinalysis, Routine w reflex microscopic (not at [facility]); Future -     CULTURE, URINE COMPREHENSIVE; Future  Type 2 diabetes mellitus with neurological manifestations, uncontrolled Orders: -     rosuvastatin (CRESTOR) 20 MG tablet; Take 1 tablet (20 mg total) by mouth daily.  Hyperlipidemia LDL goal <70 Orders: -     rosuvastatin (CRESTOR) 20 MG tablet; Take 1 tablet  (20 mg total) by mouth daily.  Peripheral vascular disease Orders: -     rosuvastatin (CRESTOR) 20 MG tablet; Take 1 tablet (20 mg total) by mouth daily.   I am having Alfred Armstrong maintain his aspirin, pregabalin, acetaminophen, Multiple Vitamins-Minerals (ONE-A-DAY MENS 50+ ADVANTAGE PO), cholecalciferol, losartan, metoprolol succinate, zolpidem, ciprofloxacin, and rosuvastatin.  Meds ordered this encounter  Medications  . ciprofloxacin (CIPRO) 500 MG tablet    Sig: Take 500 mg by mouth 2 (two) times daily.  . rosuvastatin (CRESTOR) 20 MG tablet    Sig: Take 1 tablet (20 mg total) by mouth daily.    Dispense:  90 tablet    Refill:  3     Follow-up: Return in about 3 weeks (around 09/26/2014).  Scarlette Calico, MD

## 2014-09-06 LAB — CULTURE, URINE COMPREHENSIVE
Colony Count: NO GROWTH
Organism ID, Bacteria: NO GROWTH

## 2014-09-07 ENCOUNTER — Encounter: Payer: Self-pay | Admitting: Internal Medicine

## 2014-09-12 ENCOUNTER — Other Ambulatory Visit: Payer: Self-pay | Admitting: Internal Medicine

## 2014-09-15 ENCOUNTER — Other Ambulatory Visit (INDEPENDENT_AMBULATORY_CARE_PROVIDER_SITE_OTHER): Payer: Medicare HMO

## 2014-09-15 ENCOUNTER — Encounter: Payer: Self-pay | Admitting: Internal Medicine

## 2014-09-15 ENCOUNTER — Ambulatory Visit (INDEPENDENT_AMBULATORY_CARE_PROVIDER_SITE_OTHER): Payer: Medicare HMO | Admitting: Internal Medicine

## 2014-09-15 VITALS — BP 140/70 | HR 70 | Temp 97.8°F | Resp 16 | Ht 72.0 in | Wt 227.0 lb

## 2014-09-15 DIAGNOSIS — E118 Type 2 diabetes mellitus with unspecified complications: Secondary | ICD-10-CM

## 2014-09-15 DIAGNOSIS — I10 Essential (primary) hypertension: Secondary | ICD-10-CM

## 2014-09-15 DIAGNOSIS — I2510 Atherosclerotic heart disease of native coronary artery without angina pectoris: Secondary | ICD-10-CM | POA: Diagnosis not present

## 2014-09-15 DIAGNOSIS — E785 Hyperlipidemia, unspecified: Secondary | ICD-10-CM

## 2014-09-15 LAB — URINALYSIS, ROUTINE W REFLEX MICROSCOPIC
Bilirubin Urine: NEGATIVE
Hgb urine dipstick: NEGATIVE
KETONES UR: NEGATIVE
LEUKOCYTES UA: NEGATIVE
NITRITE: NEGATIVE
PH: 5 (ref 5.0–8.0)
RBC / HPF: NONE SEEN (ref 0–?)
URINE GLUCOSE: NEGATIVE
UROBILINOGEN UA: 0.2 (ref 0.0–1.0)
WBC UA: NONE SEEN (ref 0–?)

## 2014-09-15 LAB — LIPID PANEL
CHOL/HDL RATIO: 3
CHOLESTEROL: 120 mg/dL (ref 0–200)
HDL: 45 mg/dL (ref >39.00)
LDL Cholesterol: 53 mg/dL (ref 0–99)
NonHDL: 75.39
TRIGLYCERIDES: 113 mg/dL (ref 0.0–149.0)
VLDL: 22.6 mg/dL (ref 0.0–40.0)

## 2014-09-15 LAB — TSH: TSH: 0.65 u[IU]/mL (ref 0.35–4.50)

## 2014-09-15 LAB — COMPREHENSIVE METABOLIC PANEL
ALT: 21 U/L (ref 0–53)
AST: 21 U/L (ref 0–37)
Albumin: 4.3 g/dL (ref 3.5–5.2)
Alkaline Phosphatase: 65 U/L (ref 39–117)
BUN: 17 mg/dL (ref 6–23)
CHLORIDE: 104 meq/L (ref 96–112)
CO2: 26 meq/L (ref 19–32)
CREATININE: 1.1 mg/dL (ref 0.40–1.50)
Calcium: 9.7 mg/dL (ref 8.4–10.5)
GFR: 68.32 mL/min (ref >60.00)
GLUCOSE: 112 mg/dL — AB (ref 70–99)
POTASSIUM: 3.9 meq/L (ref 3.5–5.1)
Sodium: 137 mEq/L (ref 135–145)
Total Bilirubin: 0.7 mg/dL (ref 0.2–1.2)
Total Protein: 8 g/dL (ref 6.0–8.3)

## 2014-09-15 LAB — HEMOGLOBIN A1C: Hgb A1c MFr Bld: 6.9 % — ABNORMAL HIGH (ref 4.6–6.5)

## 2014-09-15 NOTE — Progress Notes (Signed)
Subjective:  Patient ID: Alfred Armstrong, male    DOB: 1932-01-15  Age: 81 y.o. MRN: [id number]  CC: Hypertension and Prostatitis   HPI Alfred Armstrong presents for follow-up after recent prostate gland infection. He is feeling well today and offers no complaints. His abdominal pain and dysuria have resolved.  Outpatient Prescriptions Prior to Visit  Medication Sig Dispense Refill  . acetaminophen (TYLENOL) 500 MG tablet Take 1,000 mg by mouth 2 (two) times daily as needed for pain.    Marland Kitchen aspirin 81 MG EC tablet Take 81 mg by mouth daily.     . cholecalciferol (VITAMIN D) 1000 UNITS tablet Take 1,000 Units by mouth 2 (two) times daily.    . ciprofloxacin (CIPRO) 500 MG tablet Take 500 mg by mouth 2 (two) times daily.    Marland Kitchen losartan (COZAAR) 25 MG tablet TAKE 1 TABLET BY MOUTH DAILY 90 tablet 3  . metoprolol succinate (TOPROL-XL) 50 MG 24 hr tablet Take 1 tablet (50 mg total) by mouth daily. Take with or immediately following a meal. 90 tablet 3  . Multiple Vitamins-Minerals (ONE-A-DAY MENS 50+ ADVANTAGE PO) Take by mouth.    . pregabalin (LYRICA) 50 MG capsule Take 50 mg by mouth 3 (three) times daily as needed (for pain).    . rosuvastatin (CRESTOR) 20 MG tablet Take 1 tablet (20 mg total) by mouth daily. 90 tablet 3  . zolpidem (AMBIEN) 10 MG tablet TAKE 1 TABLET BY MOUTH EVERY NIGHT AT BEDTIME 30 tablet 3   No facility-administered medications prior to visit.    ROS Review of Systems  Constitutional: Negative.  Negative for fever, chills, diaphoresis, appetite change and fatigue.  HENT: Negative.   Eyes: Negative.   Respiratory: Negative.  Negative for cough, choking, chest tightness, shortness of breath and stridor.   Cardiovascular: Negative.  Negative for chest pain, palpitations and leg swelling.  Gastrointestinal: Negative.  Negative for nausea, vomiting, abdominal pain, diarrhea, constipation and blood in stool.  Endocrine: Negative.   Genitourinary: Negative.     Musculoskeletal: Negative.  Negative for myalgias, back pain, arthralgias and neck pain.  Skin: Negative.  Negative for rash.  Allergic/Immunologic: Negative.   Neurological: Negative.  Negative for dizziness, speech difficulty, weakness, light-headedness, numbness and headaches.  Hematological: Negative.  Negative for adenopathy. Does not bruise/bleed easily.  Psychiatric/Behavioral: Negative.     Objective:  BP 140/70 mmHg  Pulse 70  Temp(Src) 97.8 F (36.6 C) (Oral)  Ht 6' (1.829 m)  Wt 227 lb (102.967 kg)  BMI 30.78 kg/m2  SpO2 96%  BP Readings from Last 3 Encounters:  09/15/14 140/70  09/05/14 152/80  06/17/14 130/62    Wt Readings from Last 3 Encounters:  09/15/14 227 lb (102.967 kg)  09/05/14 229 lb (103.874 kg)  06/17/14 232 lb 4.8 oz (105.371 kg)    Physical Exam  Constitutional: He is oriented to person, place, and time. No distress.  HENT:  Nose: Nose normal.  Mouth/Throat: Oropharynx is clear and moist. No oropharyngeal exudate.  Eyes: Conjunctivae are normal. Right eye exhibits no discharge. Left eye exhibits no discharge. No scleral icterus.  Neck: Normal range of motion. Neck supple. No JVD present. No tracheal deviation present. No thyromegaly present.  Cardiovascular: Normal rate, regular rhythm, normal heart sounds and intact distal pulses.  Exam reveals no gallop and no friction rub.   No murmur heard. Pulmonary/Chest: Effort normal and breath sounds normal. No stridor. No respiratory distress. He has no wheezes. He has no rales.  He exhibits no tenderness.  Abdominal: Soft. Bowel sounds are normal. He exhibits no distension and no mass. There is no tenderness. There is no rebound and no guarding.  Musculoskeletal: Normal range of motion. He exhibits no edema or tenderness.  Lymphadenopathy:    He has no cervical adenopathy.  Neurological: He is oriented to person, place, and time.  Skin: Skin is warm and dry. No rash noted. He is not diaphoretic. No  erythema. No pallor.  Psychiatric: He has a normal mood and affect. His behavior is normal. Judgment and thought content normal.  Vitals reviewed.   Lab Results  Component Value Date   WBC 6.9 06/15/2014   HGB 13.7 06/15/2014   HCT 40.4 06/15/2014   PLT 210 06/15/2014   GLUCOSE 112* 09/15/2014   CHOL 120 09/15/2014   TRIG 113.0 09/15/2014   HDL 45.00 09/15/2014   LDLCALC 53 09/15/2014   ALT 21 09/15/2014   AST 21 09/15/2014   NA 137 09/15/2014   K 3.9 09/15/2014   CL 104 09/15/2014   CREATININE 1.10 09/15/2014   BUN 17 09/15/2014   CO2 26 09/15/2014   TSH 0.65 09/15/2014   PSA 3.05 12/14/2010   INR 1.0 09/09/2012   HGBA1C 6.9* 09/15/2014   MICROALBUR 0.9 01/14/2014    No results found.  Assessment & Plan:   Alfred Armstrong was seen today for hypertension and prostatitis.  Diagnoses and all orders for this visit:  Coronary artery disease involving native coronary artery of native heart without angina pectoris- he does not report any angina or other suspicious symptoms. I will continue to modify his risk factors with respect to hypertension, cholesterol, and blood sugar control. -     Lipid panel; Future  HYPERTENSION, BENIGN ESSENTIAL- his blood pressure is adequately well controlled, his electrolytes and renal function are stable. -     Comprehensive metabolic panel; Future -     Urinalysis, Routine w reflex microscopic (not at [facility]); Future  Type II diabetes mellitus with manifestations- his blood sugar is well controlled, he does not need any medications to treat this at this time. -     Lipid panel; Future -     Hemoglobin A1c; Future  Hyperlipidemia LDL goal <70- he has achieved his LDL goal and is doing well on the statin. -     Lipid panel; Future -     TSH; Future   I am having Alfred Armstrong maintain his aspirin, pregabalin, acetaminophen, Multiple Vitamins-Minerals (ONE-A-DAY MENS 50+ ADVANTAGE PO), cholecalciferol, metoprolol succinate, zolpidem, ciprofloxacin,  rosuvastatin, and losartan.  No orders of the defined types were placed in this encounter.     Follow-up: Return in about 4 months (around 01/15/2015).  Scarlette Calico, MD

## 2014-09-15 NOTE — Patient Instructions (Signed)

## 2014-09-15 NOTE — Progress Notes (Signed)
Pre visit review using our clinic review tool, if applicable. No additional management support is needed unless otherwise documented below in the visit note. 

## 2014-10-21 ENCOUNTER — Ambulatory Visit: Payer: Self-pay | Admitting: Podiatry

## 2014-10-26 ENCOUNTER — Ambulatory Visit (INDEPENDENT_AMBULATORY_CARE_PROVIDER_SITE_OTHER): Payer: Medicare HMO | Admitting: Podiatry

## 2014-10-26 ENCOUNTER — Encounter: Payer: Self-pay | Admitting: Podiatry

## 2014-10-26 DIAGNOSIS — M79676 Pain in unspecified toe(s): Secondary | ICD-10-CM | POA: Diagnosis not present

## 2014-10-26 DIAGNOSIS — E118 Type 2 diabetes mellitus with unspecified complications: Secondary | ICD-10-CM

## 2014-10-26 DIAGNOSIS — B351 Tinea unguium: Secondary | ICD-10-CM | POA: Diagnosis not present

## 2014-10-26 NOTE — Progress Notes (Signed)
Patient ID: Alfred Armstrong, male   DOB: [birth year], 81 y.o.   MRN: [id number] Complaint:  Visit Type: Patient returns to my office for continued preventative foot care services. Complaint: Patient states" my nails have grown long and thick and become painful to walk and wear shoes" Patient has been diagnosed with DM with no foot complications. The patient presents for preventative foot care services. No changes to ROS  Podiatric Exam: Vascular: dorsalis pedis and posterior tibial pulses are palpable bilateral. Capillary return is immediate. Temperature gradient is WNL. Skin turgor WNL  Sensorium: Normal Semmes Weinstein monofilament test. Normal tactile sensation bilaterally. Nail Exam: Pt has thick disfigured discolored nails with subungual debris noted bilateral entire nail hallux through fifth toenails Ulcer Exam: There is no evidence of ulcer or pre-ulcerative changes or infection. Orthopedic Exam: Muscle tone and strength are WNL. No limitations in general ROM. No crepitus or effusions noted. Foot type and digits show no abnormalities. Bony prominences are unremarkable. Skin: No Porokeratosis. No infection or ulcers. Asymptomatic callus hallux B/L  Diagnosis:  Onychomycosis, , Pain in right toe, pain in left toes  Treatment & Plan Procedures and Treatment: Consent by patient was obtained for treatment procedures. The patient understood the discussion of treatment and procedures well. All questions were answered thoroughly reviewed. Debridement of mycotic and hypertrophic toenails, 1 through 5 bilateral and clearing of subungual debris. No ulceration, no infection noted.  Return Visit-Office Procedure: Patient instructed to return to the office for a follow up visit 3 months for continued evaluation and treatment.

## 2014-11-16 ENCOUNTER — Telehealth: Payer: Self-pay | Admitting: Internal Medicine

## 2014-11-16 DIAGNOSIS — I10 Essential (primary) hypertension: Secondary | ICD-10-CM

## 2014-11-16 MED ORDER — METOPROLOL SUCCINATE ER 50 MG PO TB24: 50.0000 mg | ORAL_TABLET | Freq: Every day | ORAL | Status: DC

## 2014-11-16 NOTE — Telephone Encounter (Signed)
Med was not d/c on last OV will refill

## 2014-11-16 NOTE — Telephone Encounter (Signed)
Pt called ask if Dr. Ronnald Ramp wants him to continue to take metoprolol succinate (TOPROL-XL) 50 MG 24 hr tablet because he is out of it. Pt stated that if Dr. Ronnald Ramp wants to him to take it to send it to drug or discontinue it just call and left him detail massage on [phone].

## 2014-11-29 ENCOUNTER — Other Ambulatory Visit: Payer: Self-pay | Admitting: Internal Medicine

## 2014-12-15 ENCOUNTER — Telehealth: Payer: Self-pay

## 2014-12-15 NOTE — Telephone Encounter (Signed)
Pt needs an AWV. Can we change the 12/29/14 appt

## 2014-12-15 NOTE — Telephone Encounter (Signed)
yes

## 2014-12-16 NOTE — Telephone Encounter (Signed)
Cecille Rubin, will you change this.

## 2014-12-19 NOTE — Telephone Encounter (Signed)
This has been changed 

## 2014-12-19 NOTE — Telephone Encounter (Signed)
Sorry, I haven't been able to call yet. °

## 2014-12-29 ENCOUNTER — Encounter: Payer: Medicare HMO | Admitting: Internal Medicine

## 2014-12-29 DIAGNOSIS — Z0289 Encounter for other administrative examinations: Secondary | ICD-10-CM

## 2014-12-30 DIAGNOSIS — Z23 Encounter for immunization: Secondary | ICD-10-CM | POA: Diagnosis not present

## 2015-01-12 ENCOUNTER — Encounter: Payer: Medicare HMO | Admitting: Internal Medicine

## 2015-02-16 ENCOUNTER — Ambulatory Visit: Payer: Medicare HMO | Admitting: Podiatry

## 2015-02-21 ENCOUNTER — Telehealth: Payer: Self-pay

## 2015-02-21 NOTE — Telephone Encounter (Signed)
Call to schedule AWV; LVM to call back

## 2015-02-24 NOTE — Telephone Encounter (Signed)
Patient called to educate on Medicare Wellness apt. LVM for the patient to call back to educate and schedule for wellness visit.   

## 2015-03-01 ENCOUNTER — Ambulatory Visit (INDEPENDENT_AMBULATORY_CARE_PROVIDER_SITE_OTHER): Payer: Medicare HMO

## 2015-03-01 DIAGNOSIS — Z23 Encounter for immunization: Secondary | ICD-10-CM

## 2015-03-17 ENCOUNTER — Ambulatory Visit: Payer: Medicare HMO

## 2015-04-10 ENCOUNTER — Ambulatory Visit: Payer: Medicare HMO | Admitting: Internal Medicine

## 2015-04-12 ENCOUNTER — Ambulatory Visit: Payer: Medicare HMO

## 2015-05-05 ENCOUNTER — Other Ambulatory Visit: Payer: Self-pay | Admitting: Internal Medicine

## 2015-05-08 NOTE — Telephone Encounter (Signed)
fxd

## 2015-06-08 ENCOUNTER — Telehealth: Payer: Self-pay | Admitting: Hematology and Oncology

## 2015-06-08 NOTE — Telephone Encounter (Signed)
returned call and s.w pt family member and advised to pt to call back

## 2015-06-09 ENCOUNTER — Other Ambulatory Visit: Payer: Medicare HMO

## 2015-06-09 ENCOUNTER — Ambulatory Visit (HOSPITAL_COMMUNITY): Admission: RE | Admit: 2015-06-09 | Payer: Medicare HMO | Source: Ambulatory Visit

## 2015-06-16 ENCOUNTER — Ambulatory Visit: Payer: Medicare HMO | Admitting: Hematology and Oncology

## 2015-06-16 ENCOUNTER — Encounter: Payer: Self-pay | Admitting: Hematology and Oncology

## 2015-07-01 NOTE — Telephone Encounter (Signed)
Patient is on the list for [employer] 2017 and may be a good candidate for an AWV in 2017. Please let me know if/when appt is scheduled.   RE: Pt canceled March 2017 AWV. Let me know if he does/does not come in for an appt.

## 2015-07-13 ENCOUNTER — Telehealth: Payer: Self-pay

## 2015-07-13 NOTE — Telephone Encounter (Signed)
LVM to fup with AWV; gave Mr. Health my direct line. To note, Dr. Ronnald Ramp requested fup around 12/2014

## 2015-07-14 ENCOUNTER — Telehealth: Payer: Self-pay | Admitting: Hematology and Oncology

## 2015-07-14 NOTE — Telephone Encounter (Signed)
S.w. Pt and r/s appt....the patient ok and aware °

## 2015-07-18 NOTE — Telephone Encounter (Signed)
I will order labs when I see him 

## 2015-07-18 NOTE — Telephone Encounter (Signed)
Call per [employer] note to schedule AWV; [name] was educated regarding AWV and CPE; His insurer should pay for both. Stated Dr. Ronnald Armstrong plan to see him in December; Agreed to schedule CPE on August 2nd at 1:15; If Dr. Ronnald Armstrong can finish the AWV, I will complete post his visit. Explained this multiple times.   His last labs were 8/16/ [place] medicare/ does he need to come back for blood work after the 16th or can he come prior to the visit if he chooses?  Tks,

## 2015-07-19 NOTE — Telephone Encounter (Signed)
Call rec'd back from Alfred Armstrong due to message left on his VM prior to our conversation yesterday. Confirmed apt and Dr Ronnald Ramp to draw blood work when he comes in.

## 2015-07-25 ENCOUNTER — Telehealth: Payer: Self-pay | Admitting: Hematology and Oncology

## 2015-07-25 NOTE — Telephone Encounter (Signed)
PT CALLED TO CX APT .Marland Kitchen WILL CALL BACK TO RESCHED

## 2015-07-27 ENCOUNTER — Other Ambulatory Visit: Payer: Medicare HMO

## 2015-07-27 ENCOUNTER — Ambulatory Visit: Payer: Medicare HMO | Admitting: Hematology and Oncology

## 2015-08-30 ENCOUNTER — Encounter: Payer: Medicare HMO | Admitting: Internal Medicine

## 2015-08-30 ENCOUNTER — Other Ambulatory Visit: Payer: Self-pay | Admitting: Internal Medicine

## 2015-08-30 DIAGNOSIS — E785 Hyperlipidemia, unspecified: Secondary | ICD-10-CM

## 2015-08-30 DIAGNOSIS — E1165 Type 2 diabetes mellitus with hyperglycemia: Principal | ICD-10-CM

## 2015-08-30 DIAGNOSIS — IMO0002 Reserved for concepts with insufficient information to code with codable children: Secondary | ICD-10-CM

## 2015-08-30 DIAGNOSIS — I739 Peripheral vascular disease, unspecified: Secondary | ICD-10-CM

## 2015-08-30 DIAGNOSIS — E1149 Type 2 diabetes mellitus with other diabetic neurological complication: Secondary | ICD-10-CM

## 2015-09-11 ENCOUNTER — Encounter: Payer: Medicare HMO | Admitting: Internal Medicine

## 2015-09-27 ENCOUNTER — Ambulatory Visit (INDEPENDENT_AMBULATORY_CARE_PROVIDER_SITE_OTHER): Payer: Medicare HMO | Admitting: Internal Medicine

## 2015-09-27 ENCOUNTER — Encounter: Payer: Self-pay | Admitting: Internal Medicine

## 2015-09-27 ENCOUNTER — Other Ambulatory Visit (INDEPENDENT_AMBULATORY_CARE_PROVIDER_SITE_OTHER): Payer: Medicare HMO

## 2015-09-27 VITALS — BP 158/82 | HR 71 | Temp 98.3°F | Resp 16 | Ht 72.0 in | Wt 224.0 lb

## 2015-09-27 DIAGNOSIS — D472 Monoclonal gammopathy: Secondary | ICD-10-CM

## 2015-09-27 DIAGNOSIS — E118 Type 2 diabetes mellitus with unspecified complications: Secondary | ICD-10-CM | POA: Diagnosis not present

## 2015-09-27 DIAGNOSIS — I2510 Atherosclerotic heart disease of native coronary artery without angina pectoris: Secondary | ICD-10-CM

## 2015-09-27 DIAGNOSIS — Z0000 Encounter for general adult medical examination without abnormal findings: Secondary | ICD-10-CM | POA: Diagnosis not present

## 2015-09-27 DIAGNOSIS — E785 Hyperlipidemia, unspecified: Secondary | ICD-10-CM

## 2015-09-27 DIAGNOSIS — Z23 Encounter for immunization: Secondary | ICD-10-CM

## 2015-09-27 DIAGNOSIS — I10 Essential (primary) hypertension: Secondary | ICD-10-CM

## 2015-09-27 DIAGNOSIS — Z794 Long term (current) use of insulin: Secondary | ICD-10-CM

## 2015-09-27 LAB — LIPID PANEL
CHOLESTEROL: 126 mg/dL (ref 0–200)
HDL: 43.4 mg/dL (ref >39.00)
LDL Cholesterol: 61 mg/dL (ref 0–99)
NonHDL: 82.93
TRIGLYCERIDES: 110 mg/dL (ref 0.0–149.0)
Total CHOL/HDL Ratio: 3
VLDL: 22 mg/dL (ref 0.0–40.0)

## 2015-09-27 LAB — COMPREHENSIVE METABOLIC PANEL
ALBUMIN: 4.2 g/dL (ref 3.5–5.2)
ALK PHOS: 50 U/L (ref 39–117)
ALT: 16 U/L (ref 0–53)
AST: 15 U/L (ref 0–37)
BILIRUBIN TOTAL: 0.7 mg/dL (ref 0.2–1.2)
BUN: 12 mg/dL (ref 6–23)
CALCIUM: 8.8 mg/dL (ref 8.4–10.5)
CO2: 25 mEq/L (ref 19–32)
CREATININE: 1.04 mg/dL (ref 0.40–1.50)
Chloride: 106 mEq/L (ref 96–112)
GFR: 72.7 mL/min (ref >60.00)
Glucose, Bld: 138 mg/dL — ABNORMAL HIGH (ref 70–99)
Potassium: 3.7 mEq/L (ref 3.5–5.1)
Sodium: 138 mEq/L (ref 135–145)
Total Protein: 7.6 g/dL (ref 6.0–8.3)

## 2015-09-27 LAB — CBC WITH DIFFERENTIAL/PLATELET
BASOS ABS: 0 10*3/uL (ref 0.0–0.1)
Basophils Relative: 0.2 % (ref 0.0–3.0)
EOS ABS: 0.3 10*3/uL (ref 0.0–0.7)
Eosinophils Relative: 2.8 % (ref 0.0–5.0)
HEMATOCRIT: 42.9 % (ref 39.0–52.0)
HEMOGLOBIN: 14.8 g/dL (ref 13.0–17.0)
LYMPHS PCT: 20.2 % (ref 12.0–46.0)
Lymphs Abs: 1.8 10*3/uL (ref 0.7–4.0)
MCHC: 34.4 g/dL (ref 30.0–36.0)
MCV: 88.7 fl (ref 78.0–100.0)
MONOS PCT: 6.9 % (ref 3.0–12.0)
Monocytes Absolute: 0.6 10*3/uL (ref 0.1–1.0)
Neutro Abs: 6.4 10*3/uL (ref 1.4–7.7)
Neutrophils Relative %: 69.9 % (ref 43.0–77.0)
PLATELETS: 253 10*3/uL (ref 150.0–400.0)
RBC: 4.84 Mil/uL (ref 4.22–5.81)
RDW: 13.3 % (ref 11.5–15.5)
WBC: 9.1 10*3/uL (ref 4.0–10.5)

## 2015-09-27 LAB — TSH: TSH: 0.49 u[IU]/mL (ref 0.35–4.50)

## 2015-09-27 LAB — HEMOGLOBIN A1C: Hgb A1c MFr Bld: 7.1 % — ABNORMAL HIGH (ref 4.6–6.5)

## 2015-09-27 NOTE — Patient Instructions (Signed)

## 2015-09-27 NOTE — Progress Notes (Signed)
Pre visit review using our clinic review tool, if applicable. No additional management support is needed unless otherwise documented below in the visit note. 

## 2015-09-27 NOTE — Progress Notes (Signed)
Subjective:  Patient ID: Alfred Armstrong, male    DOB: [birth year]  Age: 82 y.o. MRN: [id number]  CC: Annual Exam; Hypertension; Hyperlipidemia; and Diabetes   HPI Alfred Armstrong presents for a CPX/AWV.  He has felt well since I last saw him. He tells me his blood pressures well controlled and he has had no recent episodes of headache/blurred vision/chest pain/shortness of breath/palpitations/edema/or fatigue.  He also tells me his blood sugars been well controlled, he has had no episodes of polyuria/polydipsia/or polyphagia. He's had no recent changes in his vision either.  Past Medical History:  Diagnosis Date  . Anxiety    related to hosp. enviromental   . CAD (coronary artery disease)    Dr. Aundra Dubin  . Cancer ([place])    skin  . Carotid stenosis    Followed by Dr Amedeo Plenty - Angiogram 5/10 showed left common carotid to be totally occluded and 50% RICA stenosis  . COPD (chronic obstructive pulmonary disease) ([place])   . CVA (cerebral infarction)   . Diabetes mellitus without complication ([place])   . GERD (gastroesophageal reflux disease)   . H/O hiatal hernia   . Headache(784.0)    annoyance related to enviromental allergies   . History of nephrolithiasis   . HTN (hypertension)    ACEI Cough  . Hyperlipidemia   . MGUS (monoclonal gammopathy of unknown significance) 06/17/2011  . Neuromuscular disorder ([facility])    neuropathy  . Osteoarthrosis, unspecified whether generalized or localized, unspecified site   . Shingles   . Shortness of breath    uses symbicort daily, when taking the garbage   . Stroke ([place])   . Tubulovillous adenoma polyp of colon    w/HGD 1999   Past Surgical History:  Procedure Laterality Date  . Carotid artery angiogram  04/2008  . CAROTID ENDARTERECTOMY    . CORONARY ANGIOPLASTY  04/2008  . CORONARY ARTERY BYPASS GRAFT  1998   [facility] [place]   . ENDARTERECTOMY Right 06/18/2012   Procedure: ENDARTERECTOMY CAROTID;  Surgeon: Angelia Mould, MD;  Location: [place];  Service: Vascular;  Laterality: Right;  with resection of redundant Right Internal Carotid Artery  . PATCH ANGIOPLASTY Right 06/18/2012   Procedure: PATCH ANGIOPLASTY;  Surgeon: Angelia Mould, MD;  Location: [facility];  Service: Vascular;  Laterality: Right;  using Hemashield Platinum Finesse Patch  . TONSILLECTOMY  1941    reports that he quit smoking about 22 years ago. His smoking use included Cigarettes. He has never used smokeless tobacco. He reports that he does not drink alcohol or use drugs. family history includes Alcohol abuse in his father and mother. Allergies  Allergen Reactions  . Tiotropium Bromide Monohydrate     Dry mouth  . Daliresp [Roflumilast]     Bad thoughts/dreams    Outpatient Medications Prior to Visit  Medication Sig Dispense Refill  . aspirin 81 MG EC tablet Take 81 mg by mouth daily.     . cholecalciferol (VITAMIN D) 1000 UNITS tablet Take 1,000 Units by mouth 2 (two) times daily.    Marland Kitchen losartan (COZAAR) 25 MG tablet TAKE 1 TABLET BY MOUTH DAILY 90 tablet 3  . metoprolol succinate (TOPROL-XL) 50 MG 24 hr tablet Take 1 tablet (50 mg total) by mouth daily. Take with or immediately following a meal. 90 tablet 3  . rosuvastatin (CRESTOR) 20 MG tablet TAKE 1 TABLET (20 MG TOTAL) BY MOUTH DAILY. 90 tablet 0  . zolpidem (AMBIEN) 10 MG tablet  TAKE 1 TABLET BY MOUTH EVERY NIGHT AT BEDTIME 30 tablet 4  . Multiple Vitamins-Minerals (ONE-A-DAY MENS 50+ ADVANTAGE PO) Take by mouth.    Marland Kitchen acetaminophen (TYLENOL) 500 MG tablet Take 1,000 mg by mouth 2 (two) times daily as needed for pain.    . ciprofloxacin (CIPRO) 500 MG tablet Take 500 mg by mouth 2 (two) times daily.    . pregabalin (LYRICA) 50 MG capsule Take 50 mg by mouth 3 (three) times daily as needed (for pain).     No facility-administered medications prior to visit.     ROS Review of Systems  Constitutional: Negative for activity change, appetite change, chills, diaphoresis,  fatigue, fever and unexpected weight change.  HENT: Negative.  Negative for trouble swallowing and voice change.   Eyes: Negative for visual disturbance.  Respiratory: Negative for cough, choking, chest tightness, shortness of breath, wheezing and stridor.   Cardiovascular: Negative for chest pain, palpitations and leg swelling.  Gastrointestinal: Negative.  Negative for abdominal pain, blood in stool, constipation, diarrhea, nausea and vomiting.  Endocrine: Negative.  Negative for polydipsia, polyphagia and polyuria.  Genitourinary: Negative.  Negative for dysuria.  Musculoskeletal: Negative.  Negative for back pain, joint swelling, myalgias and neck pain.  Skin: Negative.  Negative for color change, pallor and wound.  Allergic/Immunologic: Negative.   Neurological: Negative.  Negative for dizziness, tremors, weakness, light-headedness, numbness and headaches.  Hematological: Negative.  Negative for adenopathy. Does not bruise/bleed easily.  Psychiatric/Behavioral: Negative.     Objective:  BP (!) 158/82 (BP Location: Left Arm, Patient Position: Sitting, Cuff Size: Normal)   Pulse 71   Temp 98.3 F (36.8 C) (Oral)   Resp 16   Ht 6' (1.829 m)   Wt 224 lb (101.6 kg)   SpO2 95%   BMI 30.38 kg/m   BP Readings from Last 3 Encounters:  09/27/15 (!) 158/82  09/15/14 140/70  09/05/14 (!) 152/80    Wt Readings from Last 3 Encounters:  09/27/15 224 lb (101.6 kg)  09/15/14 227 lb (103 kg)  09/05/14 229 lb (103.9 kg)    Physical Exam  Constitutional: He is oriented to person, place, and time. No distress.  HENT:  Mouth/Throat: Oropharynx is clear and moist. No oropharyngeal exudate.  Eyes: Conjunctivae are normal. Left eye exhibits no discharge. No scleral icterus.  Neck: Normal range of motion. Neck supple. No JVD present. No tracheal deviation present. No thyromegaly present.  Cardiovascular: Normal rate, regular rhythm, normal heart sounds and intact distal pulses.  Exam reveals  no gallop and no friction rub.   No murmur heard. Pulmonary/Chest: Effort normal and breath sounds normal. No stridor. No respiratory distress. He has no wheezes. He has no rales. He exhibits no tenderness.  Abdominal: Soft. Bowel sounds are normal. He exhibits no distension and no mass. There is no tenderness. There is no rebound and no guarding.  Musculoskeletal: Normal range of motion. He exhibits no edema, tenderness or deformity.  Lymphadenopathy:    He has no cervical adenopathy.  Neurological: He is oriented to person, place, and time.  Skin: Skin is warm and dry. No rash noted. He is not diaphoretic. No erythema. No pallor.  Psychiatric: He has a normal mood and affect. His behavior is normal. Judgment and thought content normal.  Vitals reviewed.   Lab Results  Component Value Date   WBC 9.1 09/27/2015   HGB 14.8 09/27/2015   HCT 42.9 09/27/2015   PLT 253.0 09/27/2015   GLUCOSE 138 (H) 09/27/2015  CHOL 126 09/27/2015   TRIG 110.0 09/27/2015   HDL 43.40 09/27/2015   LDLCALC 61 09/27/2015   ALT 16 09/27/2015   AST 15 09/27/2015   NA 138 09/27/2015   K 3.7 09/27/2015   CL 106 09/27/2015   CREATININE 1.04 09/27/2015   BUN 12 09/27/2015   CO2 25 09/27/2015   TSH 0.49 09/27/2015   PSA 3.05 12/14/2010   INR 1.0 09/09/2012   HGBA1C 7.1 (H) 09/27/2015   MICROALBUR <0.7 09/27/2015    No results found.  Assessment & Plan:   Adein was seen today for annual exam, hypertension, hyperlipidemia and diabetes.  Diagnoses and all orders for this visit:  Coronary artery disease involving native coronary artery of native heart without angina pectoris- He has had no recent episodes of angina, will continue to reduce his risk factors with blood pressure control, blood sugar control, statin therapy, and aspirin therapy. -     Lipid panel; Future  HYPERTENSION, BENIGN ESSENTIAL- his blood pressure is well-controlled, electrolytes and renal function are stable. -      Comprehensive metabolic panel; Future -     Urinalysis, Routine w reflex microscopic (not at [facility]); Future  Type 2 diabetes mellitus with complication, with long-term current use of insulin ([place])- his A1c is up to 7.1%, this is a slight increase from his A1c of a year ago. At this time I do not think it's prudent to add another medication but I have encouraged him to improve his lifestyle modifications with decreased caloric intake, increased exercise, and weight loss. -     Hemoglobin A1c; Future -     Microalbumin / creatinine urine ratio; Future -     Ambulatory referral to Ophthalmology  Hyperlipidemia LDL goal <70- he is achieved his LDL goal is doing well on the statin. -     Lipid panel; Future -     TSH; Future  MGUS (monoclonal gammopathy of unknown significance)- repeat SPEP today shows that he does have a small paraprotein spike in the am region, he has no symptoms related to this, he is not anemic and has normal renal function. I think this is still MGUS and will continue to monitor. -     Comprehensive metabolic panel; Future -     CBC with Differential/Platelet; Future -     Protein electrophoresis, serum; Future  Need for prophylactic vaccination against Streptococcus pneumoniae (pneumococcus) -     Pneumococcal conjugate vaccine 13-valent  Need for prophylactic vaccination and inoculation against influenza -     Flu vaccine HIGH DOSE PF (Fluzone High dose)  Routine general medical examination at a health care facility   I have discontinued Mr. Neenan's pregabalin, acetaminophen, Multiple Vitamins-Minerals (ONE-A-DAY MENS 50+ ADVANTAGE PO), and ciprofloxacin. I am also having him maintain his aspirin, cholecalciferol, losartan, metoprolol succinate, zolpidem, and rosuvastatin.  No orders of the defined types were placed in this encounter.   See AVS for instructions about healthy living and anticipatory guidance.  Follow-up: Return in about 6 months (around  03/27/2016).  Scarlette Calico, MD

## 2015-09-28 LAB — URINALYSIS, ROUTINE W REFLEX MICROSCOPIC
BILIRUBIN URINE: NEGATIVE
HGB URINE DIPSTICK: NEGATIVE
KETONES UR: NEGATIVE
LEUKOCYTES UA: NEGATIVE
NITRITE: NEGATIVE
PH: 5.5 (ref 5.0–8.0)
Specific Gravity, Urine: <=1.005 — AB (ref 1.000–1.030)
Total Protein, Urine: NEGATIVE
UROBILINOGEN UA: 0.2 (ref 0.0–1.0)
Urine Glucose: NEGATIVE

## 2015-09-28 LAB — MICROALBUMIN / CREATININE URINE RATIO
CREATININE, U: 51.5 mg/dL
Microalb Creat Ratio: 1.4 mg/g (ref 0.0–30.0)
Microalb, Ur: <0.7 mg/dL (ref 0.0–1.9)

## 2015-09-29 LAB — PROTEIN ELECTROPHORESIS, SERUM
ABNORMAL PROTEIN BAND1: 0.7 g/dL
ALPHA-2-GLOBULIN: 0.9 g/dL (ref 0.5–0.9)
Albumin ELP: 4.1 g/dL (ref 3.8–4.8)
Alpha-1-Globulin: 0.3 g/dL (ref 0.2–0.3)
BETA GLOBULIN: 0.5 g/dL (ref 0.4–0.6)
Beta 2: 0.4 g/dL (ref 0.2–0.5)
Gamma Globulin: 1.4 g/dL (ref 0.8–1.7)
Total Protein, Serum Electrophoresis: 7.5 g/dL (ref 6.1–8.1)

## 2015-09-30 ENCOUNTER — Encounter: Payer: Self-pay | Admitting: Internal Medicine

## 2015-10-02 DIAGNOSIS — Z0000 Encounter for general adult medical examination without abnormal findings: Secondary | ICD-10-CM | POA: Insufficient documentation

## 2015-10-02 NOTE — Assessment & Plan Note (Signed)

## 2015-10-20 ENCOUNTER — Telehealth: Payer: Self-pay

## 2015-10-20 MED ORDER — ZOLPIDEM TARTRATE 10 MG PO TABS: 10.0000 mg | ORAL_TABLET | Freq: Every day | ORAL | 1 refills | Status: DC

## 2015-10-20 NOTE — Telephone Encounter (Signed)
rx rq for zolpidem 10 mg. Please advise in PCP absence. POF sending rq is Kristopher Oppenheim

## 2015-10-20 NOTE — Telephone Encounter (Signed)
Done hardcopy to Corinne  

## 2015-10-20 NOTE — Telephone Encounter (Signed)
rx has been faxed to [facility].

## 2015-11-21 ENCOUNTER — Telehealth: Payer: Self-pay | Admitting: Internal Medicine

## 2015-11-21 NOTE — Telephone Encounter (Signed)
Patient has no show charge for 8/14.  Patient is requesting this to be waived.  Please advised.

## 2015-11-22 ENCOUNTER — Other Ambulatory Visit: Payer: Self-pay | Admitting: Internal Medicine

## 2015-11-23 ENCOUNTER — Ambulatory Visit: Payer: Medicare HMO | Admitting: Internal Medicine

## 2015-11-26 ENCOUNTER — Other Ambulatory Visit: Payer: Self-pay | Admitting: Internal Medicine

## 2015-11-26 DIAGNOSIS — E785 Hyperlipidemia, unspecified: Secondary | ICD-10-CM

## 2015-11-26 DIAGNOSIS — IMO0002 Reserved for concepts with insufficient information to code with codable children: Secondary | ICD-10-CM

## 2015-11-26 DIAGNOSIS — E1165 Type 2 diabetes mellitus with hyperglycemia: Principal | ICD-10-CM

## 2015-11-26 DIAGNOSIS — I739 Peripheral vascular disease, unspecified: Secondary | ICD-10-CM

## 2015-11-26 DIAGNOSIS — E1149 Type 2 diabetes mellitus with other diabetic neurological complication: Secondary | ICD-10-CM

## 2015-11-29 NOTE — Telephone Encounter (Signed)
Will notify patient.  Also, placed copy of bill on your desk.  Can you please check to see if coding was done correctly.

## 2015-11-29 NOTE — Telephone Encounter (Signed)
Emailed billing to void NS for [facility] 09/11/15 as a patient courtesy. Tammy--please let patient know this was a courtesy, I cannot write off any future No Show's for this reason.

## 2015-11-30 NOTE — Telephone Encounter (Signed)
Email to billing to verify $80.40 balance at patient's request.

## 2015-12-13 ENCOUNTER — Telehealth: Payer: Self-pay | Admitting: Internal Medicine

## 2015-12-13 ENCOUNTER — Other Ambulatory Visit: Payer: Self-pay | Admitting: Internal Medicine

## 2015-12-13 MED ORDER — ZOLPIDEM TARTRATE 10 MG PO TABS: 10.0000 mg | ORAL_TABLET | Freq: Every day | ORAL | 3 refills | Status: DC

## 2015-12-13 NOTE — Telephone Encounter (Signed)
rx faxed to [facility].  

## 2015-12-13 NOTE — Telephone Encounter (Signed)
RX written 

## 2015-12-13 NOTE — Telephone Encounter (Signed)
Last filled in October 20 2015 #30 with 1 refill. pls advise on rf rq.   Pharmacy called while documenting this message and pharmacist stated that the fax rq twice. I apologized and stated that it will be done with 24-48 hours. Pharmacist wanted to know if we can have it done sooner. I stated that I was not able to guarantee.   Called pt and lmom for pt to return call.  RE: rx sent to pcp.

## 2015-12-13 NOTE — Telephone Encounter (Signed)
Pt called in and needs refill on his rx rq for zolpidem 10 mg  [place] [facility]

## 2015-12-17 NOTE — Telephone Encounter (Signed)
Billing contacted his insurance company. They state he SHOULD have a $10.00 copay for OV and a $30.00 copay for labs. Claim for [facility] 09/27/15 has been sent back for reprocessing and are on hold from collections. Please notify patient.

## 2015-12-19 NOTE — Telephone Encounter (Signed)
Left message for patient to call back  

## 2016-01-06 ENCOUNTER — Other Ambulatory Visit: Payer: Self-pay | Admitting: Internal Medicine

## 2016-01-06 DIAGNOSIS — I10 Essential (primary) hypertension: Secondary | ICD-10-CM

## 2016-01-16 ENCOUNTER — Telehealth: Payer: Self-pay | Admitting: Hematology and Oncology

## 2016-01-16 NOTE — Telephone Encounter (Signed)
Mailed records to [facility]

## 2016-01-18 ENCOUNTER — Telehealth: Payer: Self-pay | Admitting: Internal Medicine

## 2016-01-18 NOTE — Telephone Encounter (Signed)
Pt only sister called in and said that she is worried about her brother.  She stated that she live in [place] and he will call like 6 times a day and will never remember the conversation before.  She is worried because he live alone and driving.  She is concerned that he is having trouble with memory and also has devopled  a temper.  She is not on the hippa form but she wants to know what can be done or how to help him?    Her number is [phone]

## 2016-01-18 NOTE — Telephone Encounter (Signed)
Called Alfred Armstrong back. She stated that she will call me back in the morning.

## 2016-01-18 NOTE — Telephone Encounter (Signed)
Left message for pt sister to call me back.   RE: Please come get me if she calls back.

## 2016-01-20 NOTE — Telephone Encounter (Signed)
I did speak with sister very vaguely. Sister will try to have pt come in and have her added to the [facility].   She states the pt has changed and she is concerned. I explained that until pt has a POA or other caregiver that the pt is able to make their own decisions.

## 2016-03-26 ENCOUNTER — Ambulatory Visit: Payer: Medicare HMO | Admitting: Internal Medicine

## 2016-03-27 ENCOUNTER — Ambulatory Visit: Payer: Medicare HMO | Admitting: Internal Medicine

## 2016-04-04 ENCOUNTER — Ambulatory Visit: Payer: Medicare HMO | Admitting: Internal Medicine

## 2016-04-06 ENCOUNTER — Other Ambulatory Visit: Payer: Self-pay | Admitting: Internal Medicine

## 2016-04-08 ENCOUNTER — Ambulatory Visit: Payer: Medicare HMO | Admitting: Internal Medicine

## 2016-04-08 ENCOUNTER — Other Ambulatory Visit: Payer: Self-pay | Admitting: Internal Medicine

## 2016-04-09 ENCOUNTER — Other Ambulatory Visit: Payer: Self-pay | Admitting: Internal Medicine

## 2016-04-09 ENCOUNTER — Telehealth: Payer: Self-pay | Admitting: Internal Medicine

## 2016-04-09 NOTE — Telephone Encounter (Signed)
Pt needs a refill on zolpidem (AMBIEN) 10 MG tablet called in to [facility] on [address]. Can you call him when this is done so that he will know when the pharmacy has received it. Thanks [employer]

## 2016-04-10 ENCOUNTER — Other Ambulatory Visit: Payer: Self-pay

## 2016-04-10 ENCOUNTER — Encounter: Payer: Self-pay | Admitting: Internal Medicine

## 2016-04-10 ENCOUNTER — Other Ambulatory Visit (INDEPENDENT_AMBULATORY_CARE_PROVIDER_SITE_OTHER): Payer: Medicare HMO

## 2016-04-10 ENCOUNTER — Ambulatory Visit (INDEPENDENT_AMBULATORY_CARE_PROVIDER_SITE_OTHER): Payer: Medicare HMO | Admitting: Internal Medicine

## 2016-04-10 VITALS — BP 144/88 | HR 86 | Temp 97.9°F | Resp 16 | Wt 207.0 lb

## 2016-04-10 DIAGNOSIS — J449 Chronic obstructive pulmonary disease, unspecified: Secondary | ICD-10-CM | POA: Diagnosis not present

## 2016-04-10 DIAGNOSIS — I2510 Atherosclerotic heart disease of native coronary artery without angina pectoris: Secondary | ICD-10-CM

## 2016-04-10 DIAGNOSIS — D472 Monoclonal gammopathy: Secondary | ICD-10-CM

## 2016-04-10 DIAGNOSIS — F5101 Primary insomnia: Secondary | ICD-10-CM | POA: Diagnosis not present

## 2016-04-10 DIAGNOSIS — R69 Illness, unspecified: Secondary | ICD-10-CM | POA: Diagnosis not present

## 2016-04-10 DIAGNOSIS — E118 Type 2 diabetes mellitus with unspecified complications: Secondary | ICD-10-CM

## 2016-04-10 DIAGNOSIS — L602 Onychogryphosis: Secondary | ICD-10-CM | POA: Diagnosis not present

## 2016-04-10 DIAGNOSIS — I10 Essential (primary) hypertension: Secondary | ICD-10-CM

## 2016-04-10 LAB — CBC WITH DIFFERENTIAL/PLATELET
BASOS ABS: 0.1 10*3/uL (ref 0.0–0.1)
Basophils Relative: 1.2 % (ref 0.0–3.0)
EOS ABS: 0.1 10*3/uL (ref 0.0–0.7)
Eosinophils Relative: 2.1 % (ref 0.0–5.0)
HCT: 43.1 % (ref 39.0–52.0)
Hemoglobin: 14.8 g/dL (ref 13.0–17.0)
LYMPHS ABS: 1.4 10*3/uL (ref 0.7–4.0)
Lymphocytes Relative: 21 % (ref 12.0–46.0)
MCHC: 34.4 g/dL (ref 30.0–36.0)
MCV: 87.8 fl (ref 78.0–100.0)
MONO ABS: 0.5 10*3/uL (ref 0.1–1.0)
MONOS PCT: 7.6 % (ref 3.0–12.0)
NEUTROS ABS: 4.4 10*3/uL (ref 1.4–7.7)
NEUTROS PCT: 68.1 % (ref 43.0–77.0)
Platelets: 314 10*3/uL (ref 150.0–400.0)
RBC: 4.91 Mil/uL (ref 4.22–5.81)
RDW: 13.6 % (ref 11.5–15.5)
WBC: 6.5 10*3/uL (ref 4.0–10.5)

## 2016-04-10 LAB — COMPREHENSIVE METABOLIC PANEL
ALK PHOS: 56 U/L (ref 39–117)
ALT: 14 U/L (ref 0–53)
AST: 12 U/L (ref 0–37)
Albumin: 4.2 g/dL (ref 3.5–5.2)
BILIRUBIN TOTAL: 0.7 mg/dL (ref 0.2–1.2)
BUN: 11 mg/dL (ref 6–23)
CO2: 27 meq/L (ref 19–32)
Calcium: 9.6 mg/dL (ref 8.4–10.5)
Chloride: 105 mEq/L (ref 96–112)
Creatinine, Ser: 1.01 mg/dL (ref 0.40–1.50)
GFR: 75.1 mL/min (ref >60.00)
GLUCOSE: 157 mg/dL — AB (ref 70–99)
Potassium: 3.7 mEq/L (ref 3.5–5.1)
Sodium: 141 mEq/L (ref 135–145)
TOTAL PROTEIN: 7.6 g/dL (ref 6.0–8.3)

## 2016-04-10 LAB — HEMOGLOBIN A1C: HEMOGLOBIN A1C: 6.9 % — AB (ref 4.6–6.5)

## 2016-04-10 MED ORDER — INDACATEROL-GLYCOPYRROLATE 27.5-15.6 MCG IN CAPS: 1.0000 | ORAL_CAPSULE | Freq: Two times a day (BID) | RESPIRATORY_TRACT | 11 refills | Status: DC

## 2016-04-10 MED ORDER — ZOLPIDEM TARTRATE 10 MG PO TABS: 10.0000 mg | ORAL_TABLET | Freq: Every day | ORAL | 5 refills | Status: DC

## 2016-04-10 NOTE — Telephone Encounter (Signed)
Called pt back and he is coming in at 11:30

## 2016-04-10 NOTE — Telephone Encounter (Signed)
Ask him to come in today for an office visit

## 2016-04-10 NOTE — Progress Notes (Signed)
Subjective:  Patient ID: Alfred Armstrong, male    DOB: October 02, 1931  Age: 83 y.o. MRN: [id number]  CC: Hypertension and Diabetes   HPI Alfred Armstrong presents for f/up - He has unintentionally lost about 20 pounds since I last saw him. His sister has been worked weight about cognitive decline but he tells me his doing well. He has an occasional nonproductive cough and DOE but denies chest pain, wheezing, diaphoresis, edema, or fatigue.  Outpatient Medications Prior to Visit  Medication Sig Dispense Refill  . aspirin 81 MG EC tablet Take 81 mg by mouth daily.     . cholecalciferol (VITAMIN D) 1000 UNITS tablet Take 1,000 Units by mouth 2 (two) times daily.    Marland Kitchen losartan (COZAAR) 25 MG tablet TAKE 1 TABLET BY MOUTH DAILY 90 tablet 2  . metoprolol succinate (TOPROL-XL) 50 MG 24 hr tablet TAKE 1 TABLET (50 MG TOTAL) BY MOUTH DAILY. TAKE WITH OR IMMEDIATELY FOLLOWING A MEAL. 90 tablet 2  . rosuvastatin (CRESTOR) 20 MG tablet TAKE 1 TABLET (20 MG TOTAL) BY MOUTH DAILY. 90 tablet 3  . zolpidem (AMBIEN) 10 MG tablet Take 1 tablet (10 mg total) by mouth at bedtime. 30 tablet 3   No facility-administered medications prior to visit.     ROS Review of Systems  Constitutional: Positive for unexpected weight change. Negative for appetite change, chills, diaphoresis and fatigue.  HENT: Negative.  Negative for sinus pressure and trouble swallowing.   Eyes: Negative for visual disturbance.  Respiratory: Positive for cough and shortness of breath. Negative for chest tightness, wheezing and stridor.   Cardiovascular: Negative for chest pain, palpitations and leg swelling.  Gastrointestinal: Negative for abdominal pain, constipation, diarrhea, nausea and vomiting.  Endocrine: Negative.   Genitourinary: Negative.   Musculoskeletal: Negative.  Negative for back pain, myalgias and neck pain.  Skin: Negative.  Negative for color change and rash.  Allergic/Immunologic: Negative.   Neurological: Negative.   Negative for dizziness, seizures, weakness and headaches.  Hematological: Negative for adenopathy. Does not bruise/bleed easily.  Psychiatric/Behavioral: Positive for sleep disturbance. Negative for decreased concentration and dysphoric mood. The patient is not nervous/anxious.     Objective:  BP (!) 144/88 (BP Location: Left Arm, Patient Position: Sitting)   Pulse 86   Temp 97.9 F (36.6 C) (Oral)   Resp 16   Wt 207 lb (93.9 kg)   SpO2 94%   BMI 28.07 kg/m   BP Readings from Last 3 Encounters:  04/13/16 (!) 168/100  04/13/16 126/70  04/10/16 (!) 144/88    Wt Readings from Last 3 Encounters:  04/13/16 209 lb (94.8 kg)  04/13/16 209 lb (94.8 kg)  04/10/16 207 lb (93.9 kg)    Physical Exam  Constitutional: He is oriented to person, place, and time. No distress.  HENT:  Mouth/Throat: Oropharynx is clear and moist. No oropharyngeal exudate.  Eyes: Conjunctivae are normal. Right eye exhibits no discharge. Left eye exhibits no discharge. No scleral icterus.  Neck: Normal range of motion. Neck supple. No JVD present. No tracheal deviation present. No thyromegaly present.  Cardiovascular: Normal rate, regular rhythm, normal heart sounds and intact distal pulses.  Exam reveals no gallop and no friction rub.   No murmur heard. Pulmonary/Chest: Effort normal. No accessory muscle usage or stridor. No tachypnea. No respiratory distress. He has no decreased breath sounds. He has wheezes in the right middle field and the left middle field. He has rhonchi in the right lower field and the left  lower field. He has no rales. He exhibits no tenderness.  He has rhonchi in both bases and wheezes in the mid zones but good air movement  Abdominal: Soft. Bowel sounds are normal. He exhibits no distension and no mass. There is no tenderness. There is no rebound and no guarding.  Musculoskeletal: Normal range of motion. He exhibits no edema, tenderness or deformity.  Lymphadenopathy:    He has no  cervical adenopathy.  Neurological: He is oriented to person, place, and time.  Skin: Skin is warm and dry. No rash noted. He is not diaphoretic. No erythema. No pallor.  Psychiatric: He has a normal mood and affect. His behavior is normal. Judgment and thought content normal.  Vitals reviewed.   Lab Results  Component Value Date   WBC 7.9 04/13/2016   HGB 13.9 04/13/2016   HCT 41.0 04/13/2016   PLT 279 04/13/2016   GLUCOSE 135 (H) 04/13/2016   CHOL 126 09/27/2015   TRIG 110.0 09/27/2015   HDL 43.40 09/27/2015   LDLCALC 61 09/27/2015   ALT 16 (L) 04/13/2016   AST 19 04/13/2016   NA 141 04/13/2016   K 3.0 (L) 04/13/2016   CL 105 04/13/2016   CREATININE 1.00 04/13/2016   BUN 10 04/13/2016   CO2 24 04/13/2016   TSH 0.49 09/27/2015   PSA 3.05 12/14/2010   INR 1.01 04/13/2016   HGBA1C 6.9 (H) 04/10/2016   MICROALBUR <0.7 09/27/2015    No results found.  Assessment & Plan:   Alfred Armstrong was seen today for hypertension and diabetes.  Diagnoses and all orders for this visit:  Coronary artery disease involving native coronary artery of native heart without angina pectoris- He's had some shortness of breath but no chest pain or diaphoresis so I'm not concerned about a recurrence of angina  HYPERTENSION, BENIGN ESSENTIAL - his blood pressure is well-controlled, electrolytes and renal function are stable. -     Comprehensive metabolic panel; Future -     CBC with Differential/Platelet; Future  Type 2 diabetes mellitus with complication, without long-term current use of insulin ([place])- his blood sugars are adequately well controlled. -     Comprehensive metabolic panel; Future -     Hemoglobin A1c; Future -     Ambulatory referral to Ophthalmology -     Ambulatory referral to Podiatry  COPD (chronic obstructive pulmonary disease) with chronic bronchitis ([place])- I think he would benefit from a LABA/LAMA combination inhaler, I gave him samples of Utibron and showed him how to use  it. -     Discontinue: Indacaterol-Glycopyrrolate (UTIBRON NEOHALER) 27.5-15.6 MCG CAPS; Place 1 puff into inhaler and inhale 2 (two) times daily.  Overgrown toenails -     Ambulatory referral to Podiatry  MGUS (monoclonal gammopathy of unknown significance- I will monitor him for the development of lymphoproliferative disease. -     Protein electrophoresis, serum; Future  Primary insomnia -     zolpidem (AMBIEN) 10 MG tablet; Take 1 tablet (10 mg total) by mouth at bedtime.   I am having Alfred Armstrong maintain his aspirin, cholecalciferol, losartan, rosuvastatin, metoprolol succinate, and zolpidem.  Meds ordered this encounter  Medications  . DISCONTD: Indacaterol-Glycopyrrolate (UTIBRON NEOHALER) 27.5-15.6 MCG CAPS    Sig: Place 1 puff into inhaler and inhale 2 (two) times daily.    Dispense:  60 capsule    Refill:  11  . zolpidem (AMBIEN) 10 MG tablet    Sig: Take 1 tablet (10 mg total) by mouth at bedtime.  Dispense:  30 tablet    Refill:  5     Follow-up: Return in about 4 months (around 08/10/2016).  Scarlette Calico, MD

## 2016-04-10 NOTE — Progress Notes (Signed)
Pre visit review using our clinic review tool, if applicable. No additional management support is needed unless otherwise documented below in the visit note. 

## 2016-04-10 NOTE — Telephone Encounter (Signed)
LVM for pt to call back as soon as possible.   RE: patient needs a visit today. Will also recollect urine.  Pt has an appt schedule but can be changed for today if insurance allows.

## 2016-04-10 NOTE — Patient Instructions (Signed)
Chronic Obstructive Pulmonary Disease Chronic obstructive pulmonary disease (COPD) is a common lung condition in which airflow from the lungs is limited. COPD is a general term that can be used to describe many different lung problems that limit airflow, including both chronic bronchitis and emphysema. If you have COPD, your lung function will probably never return to normal, but there are measures you can take to improve lung function and make yourself feel better. What are the causes?  Smoking (common).  Exposure to secondhand smoke.  Genetic problems.  Chronic inflammatory lung diseases or recurrent infections. What are the signs or symptoms?  Shortness of breath, especially with physical activity.  Deep, persistent (chronic) cough with a large amount of thick mucus.  Wheezing.  Rapid breaths (tachypnea).  Gray or bluish discoloration (cyanosis) of the skin, especially in your fingers, toes, or lips.  Fatigue.  Weight loss.  Frequent infections or episodes when breathing symptoms become much worse (exacerbations).  Chest tightness. How is this diagnosed? Your health care provider will take a medical history and perform a physical examination to diagnose COPD. Additional tests for COPD may include:  Lung (pulmonary) function tests.  Chest X-ray.  CT scan.  Blood tests. How is this treated? Treatment for COPD may include:  Inhaler and nebulizer medicines. These help manage the symptoms of COPD and make your breathing more comfortable.  Supplemental oxygen. Supplemental oxygen is only helpful if you have a low oxygen level in your blood.  Exercise and physical activity. These are beneficial for nearly all people with COPD.  Lung surgery or transplant.  Nutrition therapy to gain weight, if you are underweight.  Pulmonary rehabilitation. This may involve working with a team of health care providers and specialists, such as respiratory, occupational, and physical  therapists. Follow these instructions at home:  Take all medicines (inhaled or pills) as directed by your health care provider.  Avoid over-the-counter medicines or cough syrups that dry up your airway (such as antihistamines) and slow down the elimination of secretions unless instructed otherwise by your health care provider.  If you are a smoker, the most important thing that you can do is stop smoking. Continuing to smoke will cause further lung damage and breathing trouble. Ask your health care provider for help with quitting smoking. He or she can direct you to community resources or hospitals that provide support.  Avoid exposure to irritants such as smoke, chemicals, and fumes that aggravate your breathing.  Use oxygen therapy and pulmonary rehabilitation if directed by your health care provider. If you require home oxygen therapy, ask your health care provider whether you should purchase a pulse oximeter to measure your oxygen level at home.  Avoid contact with individuals who have a contagious illness.  Avoid extreme temperature and humidity changes.  Eat healthy foods. Eating smaller, more frequent meals and resting before meals may help you maintain your strength.  Stay active, but balance activity with periods of rest. Exercise and physical activity will help you maintain your ability to do things you want to do.  Preventing infection and hospitalization is very important when you have COPD. Make sure to receive all the vaccines your health care provider recommends, especially the pneumococcal and influenza vaccines. Ask your health care provider whether you need a pneumonia vaccine.  Learn and use relaxation techniques to manage stress.  Learn and use controlled breathing techniques as directed by your health care provider. Controlled breathing techniques include: 1. Pursed lip breathing. Start by breathing in (inhaling)   through your nose for 1 second. Then, purse your lips as  if you were going to whistle and breathe out (exhale) through the pursed lips for 2 seconds. 2. Diaphragmatic breathing. Start by putting one hand on your abdomen just above your waist. Inhale slowly through your nose. The hand on your abdomen should move out. Then purse your lips and exhale slowly. You should be able to feel the hand on your abdomen moving in as you exhale.  Learn and use controlled coughing to clear mucus from your lungs. Controlled coughing is a series of short, progressive coughs. The steps of controlled coughing are: 1. Lean your head slightly forward. 2. Breathe in deeply using diaphragmatic breathing. 3. Try to hold your breath for 3 seconds. 4. Keep your mouth slightly open while coughing twice. 5. Spit any mucus out into a tissue. 6. Rest and repeat the steps once or twice as needed. Contact a health care provider if:  You are coughing up more mucus than usual.  There is a change in the color or thickness of your mucus.  Your breathing is more labored than usual.  Your breathing is faster than usual. Get help right away if:  You have shortness of breath while you are resting.  You have shortness of breath that prevents you from:  Being able to talk.  Performing your usual physical activities.  You have chest pain lasting longer than 5 minutes.  Your skin color is more cyanotic than usual.  You measure low oxygen saturations for longer than 5 minutes with a pulse oximeter. This information is not intended to replace advice given to you by your health care provider. Make sure you discuss any questions you have with your health care provider. Document Released: 10/24/2004 Document Revised: 06/22/2015 Document Reviewed: 09/10/2012 Elsevier Interactive Patient Education  2017 Elsevier Inc.  

## 2016-04-12 LAB — PROTEIN ELECTROPHORESIS, SERUM
ALBUMIN ELP: 3.9 g/dL (ref 3.8–4.8)
ALPHA-1-GLOBULIN: 0.4 g/dL — AB (ref 0.2–0.3)
Abnormal Protein Band1: 0.8 g/dL
Alpha-2-Globulin: 0.9 g/dL (ref 0.5–0.9)
BETA 2: 0.4 g/dL (ref 0.2–0.5)
Beta Globulin: 0.4 g/dL (ref 0.4–0.6)
GAMMA GLOBULIN: 1.3 g/dL (ref 0.8–1.7)
TOTAL PROTEIN, SERUM ELECTROPHOR: 7.3 g/dL (ref 6.1–8.1)

## 2016-04-13 ENCOUNTER — Encounter (HOSPITAL_COMMUNITY): Payer: Self-pay | Admitting: Emergency Medicine

## 2016-04-13 ENCOUNTER — Emergency Department (HOSPITAL_COMMUNITY)
Admission: EM | Admit: 2016-04-13 | Discharge: 2016-04-13 | Disposition: A | Discharge status: Home / Self Care | Payer: Medicare HMO | Attending: Emergency Medicine | Admitting: Emergency Medicine

## 2016-04-13 ENCOUNTER — Ambulatory Visit (INDEPENDENT_AMBULATORY_CARE_PROVIDER_SITE_OTHER): Payer: Medicare HMO | Admitting: Family Medicine

## 2016-04-13 ENCOUNTER — Encounter: Payer: Self-pay | Admitting: Family Medicine

## 2016-04-13 ENCOUNTER — Emergency Department (HOSPITAL_COMMUNITY): Payer: Medicare HMO

## 2016-04-13 VITALS — BP 126/70 | HR 77 | Temp 97.9°F | Ht 72.0 in | Wt 209.0 lb

## 2016-04-13 DIAGNOSIS — J449 Chronic obstructive pulmonary disease, unspecified: Secondary | ICD-10-CM | POA: Insufficient documentation

## 2016-04-13 DIAGNOSIS — R4182 Altered mental status, unspecified: Secondary | ICD-10-CM | POA: Diagnosis not present

## 2016-04-13 DIAGNOSIS — Z7982 Long term (current) use of aspirin: Secondary | ICD-10-CM | POA: Insufficient documentation

## 2016-04-13 DIAGNOSIS — R404 Transient alteration of awareness: Secondary | ICD-10-CM | POA: Diagnosis not present

## 2016-04-13 DIAGNOSIS — R55 Syncope and collapse: Secondary | ICD-10-CM | POA: Diagnosis not present

## 2016-04-13 DIAGNOSIS — R42 Dizziness and giddiness: Secondary | ICD-10-CM | POA: Diagnosis not present

## 2016-04-13 DIAGNOSIS — R410 Disorientation, unspecified: Secondary | ICD-10-CM | POA: Diagnosis not present

## 2016-04-13 DIAGNOSIS — E119 Type 2 diabetes mellitus without complications: Secondary | ICD-10-CM | POA: Insufficient documentation

## 2016-04-13 DIAGNOSIS — Z87891 Personal history of nicotine dependence: Secondary | ICD-10-CM | POA: Diagnosis not present

## 2016-04-13 DIAGNOSIS — Z8673 Personal history of transient ischemic attack (TIA), and cerebral infarction without residual deficits: Secondary | ICD-10-CM | POA: Diagnosis not present

## 2016-04-13 DIAGNOSIS — I2510 Atherosclerotic heart disease of native coronary artery without angina pectoris: Secondary | ICD-10-CM | POA: Diagnosis not present

## 2016-04-13 DIAGNOSIS — Z951 Presence of aortocoronary bypass graft: Secondary | ICD-10-CM | POA: Insufficient documentation

## 2016-04-13 DIAGNOSIS — R791 Abnormal coagulation profile: Secondary | ICD-10-CM | POA: Diagnosis not present

## 2016-04-13 LAB — URINALYSIS, ROUTINE W REFLEX MICROSCOPIC
Bacteria, UA: NONE SEEN
Bilirubin Urine: NEGATIVE
Glucose, UA: NEGATIVE mg/dL
Ketones, ur: NEGATIVE mg/dL
Leukocytes, UA: NEGATIVE
Nitrite: NEGATIVE
Protein, ur: 30 mg/dL — AB
RBC / HPF: NONE SEEN RBC/hpf (ref 0–5)
Specific Gravity, Urine: 1.009 (ref 1.005–1.030)
pH: 6 (ref 5.0–8.0)

## 2016-04-13 LAB — RAPID URINE DRUG SCREEN, HOSP PERFORMED
Amphetamines: NOT DETECTED
Barbiturates: NOT DETECTED
Benzodiazepines: NOT DETECTED
Cocaine: NOT DETECTED
Opiates: NOT DETECTED
Tetrahydrocannabinol: NOT DETECTED

## 2016-04-13 LAB — COMPREHENSIVE METABOLIC PANEL
ALT: 16 U/L — ABNORMAL LOW (ref 17–63)
AST: 19 U/L (ref 15–41)
Albumin: 3.8 g/dL (ref 3.5–5.0)
Alkaline Phosphatase: 53 U/L (ref 38–126)
Anion gap: 6 (ref 5–15)
BUN: 11 mg/dL (ref 6–20)
CO2: 24 mmol/L (ref 22–32)
Calcium: 8.7 mg/dL — ABNORMAL LOW (ref 8.9–10.3)
Chloride: 109 mmol/L (ref 101–111)
Creatinine, Ser: 1 mg/dL (ref 0.61–1.24)
GFR calc Af Amer: >60 mL/min (ref >60)
GFR calc non Af Amer: >60 mL/min (ref >60)
Glucose, Bld: 133 mg/dL — ABNORMAL HIGH (ref 65–99)
Potassium: 2.9 mmol/L — ABNORMAL LOW (ref 3.5–5.1)
Sodium: 139 mmol/L (ref 135–145)
Total Bilirubin: 0.7 mg/dL (ref 0.3–1.2)
Total Protein: 7.4 g/dL (ref 6.5–8.1)

## 2016-04-13 LAB — DIFFERENTIAL
Basophils Absolute: 0 10*3/uL (ref 0.0–0.1)
Basophils Relative: 0 %
Eosinophils Absolute: 0.1 10*3/uL (ref 0.0–0.7)
Eosinophils Relative: 1 %
Lymphocytes Relative: 18 %
Lymphs Abs: 1.4 10*3/uL (ref 0.7–4.0)
Monocytes Absolute: 0.5 10*3/uL (ref 0.1–1.0)
Monocytes Relative: 7 %
Neutro Abs: 5.8 10*3/uL (ref 1.7–7.7)
Neutrophils Relative %: 74 %

## 2016-04-13 LAB — I-STAT CHEM 8, ED
BUN: 10 mg/dL (ref 6–20)
Calcium, Ion: 1.11 mmol/L — ABNORMAL LOW (ref 1.15–1.40)
Chloride: 105 mmol/L (ref 101–111)
Creatinine, Ser: 1 mg/dL (ref 0.61–1.24)
Glucose, Bld: 135 mg/dL — ABNORMAL HIGH (ref 65–99)
HCT: 41 % (ref 39.0–52.0)
Hemoglobin: 13.9 g/dL (ref 13.0–17.0)
Potassium: 3 mmol/L — ABNORMAL LOW (ref 3.5–5.1)
Sodium: 141 mmol/L (ref 135–145)
TCO2: 23 mmol/L (ref 0–100)

## 2016-04-13 LAB — CBC
HCT: 39.7 % (ref 39.0–52.0)
Hemoglobin: 13.6 g/dL (ref 13.0–17.0)
MCH: 29.6 pg (ref 26.0–34.0)
MCHC: 34.3 g/dL (ref 30.0–36.0)
MCV: 86.3 fL (ref 78.0–100.0)
Platelets: 279 10*3/uL (ref 150–400)
RBC: 4.6 MIL/uL (ref 4.22–5.81)
RDW: 13.4 % (ref 11.5–15.5)
WBC: 7.9 10*3/uL (ref 4.0–10.5)

## 2016-04-13 LAB — I-STAT TROPONIN, ED: Troponin i, poc: 0.01 ng/mL (ref 0.00–0.08)

## 2016-04-13 LAB — PROTIME-INR
INR: 1.01
Prothrombin Time: 13.3 seconds (ref 11.4–15.2)

## 2016-04-13 LAB — ETHANOL: Alcohol, Ethyl (B): <5 mg/dL (ref <5)

## 2016-04-13 LAB — APTT: aPTT: 24 seconds (ref 24–36)

## 2016-04-13 NOTE — Progress Notes (Addendum)
Alfred Armstrong , 04-Sep-1931, 83 y.o., male MRN: [id number] Patient Care Team    Relationship Specialty Notifications Start End  Janith Lima, MD PCP - General   01/03/10   Larey Dresser, MD  Cardiology  04/24/11   Marut Lark, MD Consulting Physician Hematology and Oncology  06/17/14     CC: confusion Subjective: Pt presents for a walk-in OV with complaints of confusion of 2 days duration.  Associated symptoms include repeating himself, memory loss. He has a h/o stroke/CAD/HTN/DM/COPD. He seems to be uncertain if he had a stroke in the past for sure, however it is well documented in EMR.  He started a new and has used it last night and this morning. He states he feels like it is a little strong for him, making him cough and made him feel cloudy.  He walked into clinic unaware it was not routine clinic hours. He repeated himself many times asking the same questions to front desk staff, although they had just answered those questions for him. He was unaware he had asked those questions and did not recall the conversation. Staff became concerned for his well being and roomed him to be seen.   Depression screen [facility] 2/9 10/02/2015 09/05/2014 08/24/2012 06/25/2012  Decreased Interest 0 0 0 0  Down, Depressed, Hopeless 0 0 0 0  PHQ - 2 Score 0 0 0 0    Allergies  Allergen Reactions  . Tiotropium Bromide Monohydrate     Dry mouth  . Daliresp [Roflumilast]     Bad thoughts/dreams   Social History  Substance Use Topics  . Smoking status: Former Smoker    Types: Cigarettes    Quit date: 01/28/1993  . Smokeless tobacco: Never Used  . Alcohol use No     Comment: stopped drinking many yrs. ago    Past Medical History:  Diagnosis Date  . Anxiety    related to hosp. enviromental   . CAD (coronary artery disease)    Dr. Aundra Dubin  . Cancer ([place])    skin  . Carotid stenosis    Followed by Dr Amedeo Plenty - Angiogram 5/10 showed left common carotid to be totally occluded and 50% RICA stenosis  . COPD  (chronic obstructive pulmonary disease) ([place])   . CVA (cerebral infarction)   . Diabetes mellitus without complication ([place])   . GERD (gastroesophageal reflux disease)   . H/O hiatal hernia   . Headache(784.0)    annoyance related to enviromental allergies   . History of nephrolithiasis   . HTN (hypertension)    ACEI Cough  . Hyperlipidemia   . MGUS (monoclonal gammopathy of unknown significance) 06/17/2011  . Neuromuscular disorder ([facility])    neuropathy  . Osteoarthrosis, unspecified whether generalized or localized, unspecified site   . Shingles   . Shortness of breath    uses symbicort daily, when taking the garbage   . Stroke ([place])   . Tubulovillous adenoma polyp of colon    w/HGD 1999   Past Surgical History:  Procedure Laterality Date  . Carotid artery angiogram  04/2008  . CAROTID ENDARTERECTOMY    . CORONARY ANGIOPLASTY  04/2008  . CORONARY ARTERY BYPASS GRAFT  1998   [facility] in [place]   . ENDARTERECTOMY Right 06/18/2012   Procedure: ENDARTERECTOMY CAROTID;  Surgeon: Angelia Mould, MD;  Location: [place] [facility];  Service: Vascular;  Laterality: Right;  with resection of redundant Right Internal Carotid Artery  . PATCH ANGIOPLASTY Right 06/18/2012  Procedure: PATCH ANGIOPLASTY;  Surgeon: Angelia Mould, MD;  Location: [facility] OR;  Service: Vascular;  Laterality: Right;  using Hemashield Platinum Finesse Patch  . TONSILLECTOMY  1941   Family History  Problem Relation Age of Onset  . Alcohol abuse Mother   . Alcohol abuse Father   . Colon cancer Neg Hx    Allergies as of 04/13/2016      Reactions   Tiotropium Bromide Monohydrate    Dry mouth   Daliresp [roflumilast]    Bad thoughts/dreams      Medication List       Accurate as of 04/13/16 11:39 AM. Always use your most recent med list.          aspirin 81 MG EC tablet Take 81 mg by mouth daily.   cholecalciferol 1000 units tablet Commonly known as:  VITAMIN D Take 1,000 Units by mouth 2  (two) times daily.   Indacaterol-Glycopyrrolate 27.5-15.6 MCG Caps Commonly known as:  UTIBRON NEOHALER Place 1 puff into inhaler and inhale 2 (two) times daily.   losartan 25 MG tablet Commonly known as:  COZAAR TAKE 1 TABLET BY MOUTH DAILY   metoprolol succinate 50 MG 24 hr tablet Commonly known as:  TOPROL-XL TAKE 1 TABLET (50 MG TOTAL) BY MOUTH DAILY. TAKE WITH OR IMMEDIATELY FOLLOWING A MEAL.   rosuvastatin 20 MG tablet Commonly known as:  CRESTOR TAKE 1 TABLET (20 MG TOTAL) BY MOUTH DAILY.   zolpidem 10 MG tablet Commonly known as:  AMBIEN Take 1 tablet (10 mg total) by mouth at bedtime.       No results found for this or any previous visit (from the past 24 hour(s)). No results found.   ROS: Negative, with the exception of above mentioned in HPI   Objective:  BP 126/70 (BP Location: Left Arm, Patient Position: Sitting, Cuff Size: Large)   Pulse 77   Temp 97.9 F (36.6 C) (Oral)   Ht 6' (1.829 m)   Wt 209 lb (94.8 kg)   SpO2 96%   BMI 28.35 kg/m  Body mass index is 28.35 kg/m. Gen: Afebrile. Altered mental status.  HENT: AT. [place].MMM, no oral lesions.  Eyes:Pupils Equal Round Reactive to light, Extraocular movements intact,  Conjunctiva without redness, discharge or icterus. Neck/lymp/endocrine: Supple,no lymphadenopathy CV: RRR, no edema Chest: CTAB, no wheeze or crackles. Good air movement, normal resp effort.  Abd: Soft.. NTND. BS present. mo Masses palpated. No rebound or guarding.  MSK: NROM. No obvious deformity.  Skin: no rashes, purpura or petechiae.  Neuro: Normal gait. PERLA. EOMi. Alert. Oriented to person, place otherwise confusion and repeating self ans same questions.  Cranial nerves II through XII intact. Muscle strength 5/5 Upper and lower extremity. Symmetrical facies. Normal speech tone, no slurring. Some word finding experienced.   Assessment/Plan: AVNER STRODER is a 83 y.o. male present for OV for  1. Altered mental status,  unspecified altered mental status type - concern for stroke like symptoms with word finding difficulties  and AMS. Pt has had a h/o stroke. He has been started on a new inhaler 3 days ago, that can cause CNS depression which may be causing his symptoms. He has only used twice last night and this morning. Can not be certain with his history it is not stroke related and he certainly can not drive home in his current state.  - Pt drove himself to clinic today. His emergency contact Ellie was notified for him.  - while present in  the room with this provider he continued to ask appropriate questions and seemed reasonably  mentally intact, with normal exam. However, he would not recall the complete conversation a few minutes later and again ask same questions. He had moments of having word finding difficulties.  - EMS notified and he was sent vis EMS to [facility] hospital for stroke rule out.    Reviewed expectations re: course of current medical issues.  Discussed self-management of symptoms.  Outlined signs and symptoms indicating need for more acute intervention.  Patient verbalized understanding and all questions were answered.  Patient received an After-Visit Summary.   electronically signed by:  Howard Pouch, DO  [place]

## 2016-04-13 NOTE — Patient Instructions (Signed)
Sent via ems to ED.

## 2016-04-13 NOTE — ED Notes (Signed)
Hedges at bedside.

## 2016-04-13 NOTE — ED Notes (Addendum)
Pt EMS pt sent from PCP for evaluation of AMS. With triage pt verbalizes "did not like the reaction my new medication gave me." Pt prescribed and dosed with utibron neohaler 3/14. Pt alert and oriented x4, strong grips, pulses, and denies numbness.

## 2016-04-13 NOTE — Care Management Note (Signed)
Case Management Note  Patient Details  Name: Alfred Armstrong MRN: [id number] Date of Birth: [birth year]  Subjective/Objective:  confusion                  Action/Plan: Discharge Planning: NCM spoke to pt and cousin, Alfred Armstrong at bedside. Pt states he lives at home alone. States he was started on new inhaler and believes he had a reaction. Reports becoming confused. Pt reports he still drives. States he is in the process of having family discussed POA which will be his cousin, Alfred Armstrong. His cousin assist him at home as needed. No NCM needs identified.    PCP Scarlette Calico MD    Expected Discharge Date:  04/13/2016              Expected Discharge Plan:  Home/Self Care  In-House Referral:  NA  Discharge planning Services  CM Consult  Post Acute Care Choice:  NA Choice offered to:  NA  DME Arranged:  N/A DME Agency:  NA  HH Arranged:  NA HH Agency:  NA  Status of Service:  Completed, signed off  If discussed at [place] of Stay Meetings, dates discussed:    Additional Comments:  Erenest Rasher, RN 04/13/2016, 4:20 PM

## 2016-04-13 NOTE — ED Provider Notes (Signed)
[facility] DEPT Provider Note   CSN: [id number] Arrival date & time: 04/13/16  1218   History   Chief Complaint Chief Complaint  Patient presents with  . Medication Reaction    HPI Alfred Armstrong is a 83 y.o. male.  HPI    83 year old male with a history of stroke, CAD, hypertension, type 2 diabetes, COPD presents today at the request of primary care provider for altered mental status.  Patient was originally alone, later on to visit his cousin showed up.  Patient reports that he went over to his primary care provider's office today to return in a breathing apparatus.  He does not recall why he went over to take that in or the events surrounding his transferred over to the hospital.  He reports that he feels he is in baseline mental status with no acute concerns.  He denies any distal neurological deficits.  Patient does report that he has a history of stroke.  Patient notes that he started any medication yesterday ( utibron neohaler) and did not like it has not caused him to be "cloudy headed".   Patient's cousin arrives and reports that this is not abnormal behavior for the patient.  He reports that over the last 3 months he has had progressively worsening memory issues.  He notes he has had numerous conversations with family members and patient about long-term care in the event that his memory continues to deteriorate.  He notes that he recently saw him 3 days ago, and appears unchanged since that visit.    Past Medical History:  Diagnosis Date  . Anxiety    related to hosp. enviromental   . CAD (coronary artery disease)    Dr. Aundra Dubin  . Cancer ([place])    skin  . Carotid stenosis    Followed by Dr Amedeo Plenty - Angiogram 5/10 showed left common carotid to be totally occluded and 50% RICA stenosis  . COPD (chronic obstructive pulmonary disease) ([place])   . CVA (cerebral infarction)   . Diabetes mellitus without complication ([place])   . GERD (gastroesophageal reflux disease)   . H/O  hiatal hernia   . Headache(784.0)    annoyance related to enviromental allergies   . History of nephrolithiasis   . HTN (hypertension)    [facility] Cough  . Hyperlipidemia   . MGUS (monoclonal gammopathy of unknown significance) 06/17/2011  . Neuromuscular disorder ([facility])    neuropathy  . Osteoarthrosis, unspecified whether generalized or localized, unspecified site   . Shingles   . Shortness of breath    uses symbicort daily, when taking the garbage   . Stroke ([place])   . Tubulovillous adenoma polyp of colon    w/HGD 1999    Patient Active Problem List   Diagnosis Date Noted  . Overgrown toenails 04/10/2016  . Primary insomnia 04/10/2016  . Routine general medical examination at a health care facility 10/02/2015  . Occlusion and stenosis of carotid artery without mention of cerebral infarction 01/06/2013  . MGUS (monoclonal gammopathy of unknown significance) 06/17/2011  . PHN (postherpetic neuralgia) 05/09/2011  . Kidney stone on left side 04/10/2011  . CAD (coronary artery disease) 02/05/2011  . Type II diabetes mellitus with manifestations ([place]) 02/01/2011  . Peripheral neuropathy ([place]) 07/18/2010  . Vitamin D deficient osteomalacia 07/18/2010  . HYPERTENSION, BENIGN ESSENTIAL 07/05/2009  . Hyperlipidemia LDL goal <70 06/06/2009  . COPD (chronic obstructive pulmonary disease) with chronic bronchitis ([place]) 09/06/2008  . GERD 09/06/2008  . OSTEOARTHRITIS 09/06/2008  Past Surgical History:  Procedure Laterality Date  . Carotid artery angiogram  04/2008  . CAROTID ENDARTERECTOMY    . CORONARY ANGIOPLASTY  04/2008  . CORONARY ARTERY BYPASS GRAFT  1998   [facility]. in [place]   . ENDARTERECTOMY Right 06/18/2012   Procedure: ENDARTERECTOMY CAROTID;  Surgeon: Angelia Mould, MD;  Location: [facility];  Service: Vascular;  Laterality: Right;  with resection of redundant Right Internal Carotid Artery  . PATCH ANGIOPLASTY Right 06/18/2012   Procedure: PATCH  ANGIOPLASTY;  Surgeon: Angelia Mould, MD;  Location: [facility] OR;  Service: Vascular;  Laterality: Right;  using Hemashield Platinum Finesse Patch  . TONSILLECTOMY  1941       Home Medications    Prior to Admission medications   Medication Sig Start Date End Date Taking? Authorizing Provider  aspirin 81 MG EC tablet Take 81 mg by mouth daily.     Historical Provider, MD  cholecalciferol (VITAMIN D) 1000 UNITS tablet Take 1,000 Units by mouth 2 (two) times daily.    Historical Provider, MD  Indacaterol-Glycopyrrolate (UTIBRON NEOHALER) 27.5-15.6 MCG CAPS Place 1 puff into inhaler and inhale 2 (two) times daily. 04/10/16   Janith Lima, MD  losartan (COZAAR) 25 MG tablet TAKE 1 TABLET BY MOUTH DAILY 11/22/15   Janith Lima, MD  metoprolol succinate (TOPROL-XL) 50 MG 24 hr tablet TAKE 1 TABLET (50 MG TOTAL) BY MOUTH DAILY. TAKE WITH OR IMMEDIATELY FOLLOWING A MEAL. 01/07/16   Janith Lima, MD  rosuvastatin (CRESTOR) 20 MG tablet TAKE 1 TABLET (20 MG TOTAL) BY MOUTH DAILY. 11/26/15   Janith Lima, MD  zolpidem (AMBIEN) 10 MG tablet Take 1 tablet (10 mg total) by mouth at bedtime. 04/10/16   Janith Lima, MD    Family History Family History  Problem Relation Age of Onset  . Alcohol abuse Mother   . Alcohol abuse Father   . Colon cancer Neg Hx     Social History Social History  Substance Use Topics  . Smoking status: Former Smoker    Types: Cigarettes    Quit date: 01/28/1993  . Smokeless tobacco: Never Used  . Alcohol use No     Comment: stopped drinking many yrs. ago      Allergies   Tiotropium bromide monohydrate and Daliresp [roflumilast]   Review of Systems Review of Systems  All other systems reviewed and are negative.   Physical Exam Updated Vital Signs BP (!) 166/98 (BP Location: Right Arm)   Pulse (!) 58   Temp 97.6 F (36.4 C)   Resp 18   Ht 6' (1.829 m)   Wt 94.8 kg   SpO2 98%   BMI 28.35 kg/m   Physical Exam  Constitutional: He is oriented  to person, place, and time. He appears well-developed and well-nourished.  HENT:  Head: Normocephalic and atraumatic.  Eyes: Conjunctivae are normal. Pupils are equal, round, and reactive to light. Right eye exhibits no discharge. Left eye exhibits no discharge. No scleral icterus.  Neck: Normal range of motion. No JVD present. No tracheal deviation present.  Pulmonary/Chest: Effort normal. No stridor.  Neurological: He is alert and oriented to person, place, and time. He has normal strength. No cranial nerve deficit or sensory deficit. Coordination normal. GCS eye subscore is 4. GCS verbal subscore is 5. GCS motor subscore is 6.  Alert to person place time and event  Psychiatric: He has a normal mood and affect. His behavior is normal. Judgment and  thought content normal.  Nursing note and vitals reviewed.    ED Treatments / Results  Labs (all labs ordered are listed, but only abnormal results are displayed) Labs Reviewed  COMPREHENSIVE METABOLIC PANEL - Abnormal; Notable for the following:       Result Value   Potassium 2.9 (*)    Glucose, Bld 133 (*)    Calcium 8.7 (*)    ALT 16 (*)    All other components within normal limits  URINALYSIS, ROUTINE W REFLEX MICROSCOPIC - Abnormal; Notable for the following:    Hgb urine dipstick SMALL (*)    Protein, ur 30 (*)    Squamous Epithelial / LPF 0-5 (*)    All other components within normal limits  I-STAT CHEM 8, ED - Abnormal; Notable for the following:    Potassium 3.0 (*)    Glucose, Bld 135 (*)    Calcium, Ion 1.11 (*)    All other components within normal limits  ETHANOL  PROTIME-INR  APTT  CBC  DIFFERENTIAL  RAPID URINE DRUG SCREEN, HOSP PERFORMED  I-STAT TROPOININ, ED    EKG  EKG Interpretation  Date/Time:  Saturday April 13 2016 12:48:04 EDT Ventricular Rate:  67 PR Interval:    QRS Duration: 103 QT Interval:  444 QTC Calculation: 469 R Axis:   -31 Text Interpretation:  Sinus rhythm Atrial premature complexes  Left axis deviation Probable anterior infarct, old Borderline repolarization abnormality Since last tracing rate faster Confirmed by Eulis Foster  MD, ELLIOTT [phone]) on 04/13/2016 1:41:30 PM       Radiology Ct Head Wo Contrast  Result Date: 04/13/2016 CLINICAL DATA:  Episode of confusion today. EXAM: CT HEAD WITHOUT CONTRAST TECHNIQUE: Contiguous axial images were obtained from the base of the skull through the vertex without intravenous contrast. COMPARISON:  Maxillofacial CT 05/27/2005 FINDINGS: Brain: Ventricles and cisterns are within normal. CSF spaces are within normal. There is evidence of an old left frontal infarct. No mass, mass effect, shift of midline structures or acute hemorrhage. No evidence of acute infarction. Vascular: Calcified plaque over the cavernous segment internal carotid artery's and right vertebral artery. Skull: Within normal. Sinuses/Orbits: Orbits are normal. There is moderate opacification over the ethmoid, sphenoid and left maxillary sinus. Other: None. IMPRESSION: No acute intracranial findings. Old left frontal infarct. Moderate chronic sinus inflammatory disease. Electronically Signed   By: Marin Olp M.D.   On: 04/13/2016 13:23   Mr Brain Wo Contrast  Result Date: 04/13/2016 CLINICAL DATA:  Confusion. EXAM: MRI HEAD WITHOUT CONTRAST TECHNIQUE: Multiplanar, multiecho pulse sequences of the brain and surrounding structures were obtained without intravenous contrast. COMPARISON:  Head CT 04/13/2016 FINDINGS: Brain: There is no evidence of acute infarct, intracranial hemorrhage, mass, midline shift, or extra-axial fluid collection. A chronic left MCA infarct is again seen involving the posterior left frontal lobe. Mild global cerebral atrophy is within normal limits for age. Small foci of T2 hyperintensity scattered throughout the cerebral white matter bilaterally are nonspecific but compatible with mild chronic small vessel ischemic disease. Vascular: Major intracranial  vascular flow voids are preserved, with the right vertebral artery being dominant. Skull and upper cervical spine: No suspicious marrow lesion. Fusion between the C3-4 vertebral bodies and left posterior elements. Sinuses/Orbits: Unremarkable orbits. Near complete opacification of the left maxillary sinus with mucosal thickening and large volume fluid. Mild-to-moderate mucosal thickening and smaller fluid elsewhere throughout the sinuses. Trace left mastoid effusion. Other: None. IMPRESSION: 1. No acute intracranial abnormality. 2. Mild chronic small vessel ischemic  disease. 3. Chronic left frontal lobe infarct. 4. Sinusitis. Electronically Signed   By: Logan Bores M.D.   On: 04/13/2016 16:44    Procedures Procedures (including critical care time)  Medications Ordered in ED Medications - No data to display   Initial Impression / Assessment and Plan / ED Course  I have reviewed the triage vital signs and the nursing notes.  Pertinent labs & imaging results that were available during my care of the patient were reviewed by me and considered in my medical decision making (see chart for details).     Final Clinical Impressions(s) / ED Diagnoses   Final diagnoses:  Confusion    Labs: Urine rapid drug screen, urinalysis, stat Chem-8, i-STAT troponin, ethanol, PT/INR, APTT, CBC, CMP  Imaging: CT head without, MRI without  Consults:  Therapeutics:  Discharge Meds:   Assessment/Plan:   83 year old male presents today with some confusion.  Patient's workup shows no acute findings.  Patient's family members at bedside reports he is at his baseline.  Patient can still not recall why he was sent over here in his is acutely confused as to his visit here today.  I spent a significant amount of time discussing his evaluation and necessity for evaluation of strokelike symptoms.  I believe this is his baseline, and with no acute findings on CT or MRI, no metabolic abnormalities or infectious  etiology there is no acute reason to continue evaluation or management here in the ED.  Case management was consulted who had a discussion with both the patient and family about long-term options in the event of worsening conditions.  Patient will be discharged home with his family with primary care follow-up to manage the medication that was started, and follow-up from today's visit.  They are given strict return precautions, they verbalized understanding and agreement to today's plan had no further questions or concerns.    Patient care was shared with attending physician today Dr. Oleta Mouse who agreed to my assessment and plan.     New Prescriptions New Prescriptions   No medications on file     Okey Regal, PA-C 04/13/16 [place] Liu, MD 04/13/16 [phone]

## 2016-04-13 NOTE — ED Notes (Signed)
Bed: WA02 Expected date:  Expected time:  Means of arrival:  Comments: Alfred Armstrong

## 2016-04-13 NOTE — Discharge Instructions (Signed)
Please read attached information. If you experience any new or worsening signs or symptoms please return to the emergency room for evaluation. Please follow-up with your primary care provider or specialist as discussed.  °

## 2016-04-13 NOTE — ED Provider Notes (Signed)
Medical screening examination/treatment/procedure(s) were conducted as a shared visit with non-physician practitioner(s) and myself.  I personally evaluated the patient during the encounter.   EKG Interpretation  Date/Time:  Saturday April 13 2016 12:48:04 EDT Ventricular Rate:  67 PR Interval:    QRS Duration: 103 QT Interval:  444 QTC Calculation: 469 R Axis:   -31 Text Interpretation:  Sinus rhythm Atrial premature complexes Left axis deviation Probable anterior infarct, old Borderline repolarization abnormality Since last tracing rate faster Confirmed by Eulis Foster  MD, ELLIOTT [phone]) on 04/13/2016 1:41:37 PM      83 year old male who was sent from PCP for evaluation of confusion. History of CVA, CAD, HTN, DM. Patient reports he was taking an inhaler that was recently prescribed to him back to PCP's office as it make him feel "weird." per PCP's office note, patient occasionally asking repetitive questions, seems forgetful of what just recently happened, and word finding problems. Sent to ED for evaluation. He states that for several weeks now he has been told that he asks a lot of repetitive questions and told that he is forgetful of things. No other complaints.   Patient fully oriented, but with some short term memory loss as not fully remembering why he was sent to ED. No dysarthria or aphasia. With normal neuro exam. VS stable. No infection, electrolyte or metabolic derangements. Ct head visualized and unremarkable. Family subsequently at bedside, stating that he has had short term memory loss for past 3 months or so, and may be more mild cognitive decline due to age/dementia. Will obtain MRI to rule out acute CVA. If normal will discharge with continued outpatient work-up.    Forde Dandy, MD 04/13/16 [phone]

## 2016-04-13 NOTE — ED Notes (Addendum)
Case management with pt and nephew; pt taken to MRI; nephew continues to speak with case management.

## 2016-04-15 ENCOUNTER — Ambulatory Visit: Payer: Medicare HMO | Admitting: Internal Medicine

## 2016-04-22 ENCOUNTER — Ambulatory Visit (INDEPENDENT_AMBULATORY_CARE_PROVIDER_SITE_OTHER): Payer: Medicare HMO | Admitting: Internal Medicine

## 2016-04-22 ENCOUNTER — Encounter: Payer: Self-pay | Admitting: Internal Medicine

## 2016-04-22 VITALS — BP 162/88 | HR 81 | Temp 98.2°F | Resp 16 | Ht 72.0 in | Wt 206.5 lb

## 2016-04-22 DIAGNOSIS — F418 Other specified anxiety disorders: Secondary | ICD-10-CM

## 2016-04-22 DIAGNOSIS — F0390 Unspecified dementia without behavioral disturbance: Secondary | ICD-10-CM | POA: Insufficient documentation

## 2016-04-22 DIAGNOSIS — E876 Hypokalemia: Secondary | ICD-10-CM | POA: Diagnosis not present

## 2016-04-22 DIAGNOSIS — I2510 Atherosclerotic heart disease of native coronary artery without angina pectoris: Secondary | ICD-10-CM | POA: Diagnosis not present

## 2016-04-22 DIAGNOSIS — R69 Illness, unspecified: Secondary | ICD-10-CM | POA: Diagnosis not present

## 2016-04-22 MED ORDER — ASPIRIN 81 MG PO TBEC: 81.0000 mg | DELAYED_RELEASE_TABLET | Freq: Every day | ORAL | 11 refills | Status: DC

## 2016-04-22 MED ORDER — SERTRALINE HCL 50 MG PO TABS: 50.0000 mg | ORAL_TABLET | Freq: Every day | ORAL | 3 refills | Status: DC

## 2016-04-22 NOTE — Progress Notes (Signed)
Subjective:  Patient ID: Alfred Armstrong, male    DOB: Jun 12, 1931  Age: 83 y.o. MRN: [id number]  CC: COPD   HPI Alfred Armstrong presents for Follow-up. When I saw him a few weeks ago I started treating his COPD with a LABA/LAMA inhaler. Soon after that visit he returned to the office on a Saturday complaining that the inhaler caused side effects. Based on interviewing him and the notes it sounded like it caused him to experience confusion and decreased level of cognitive function. He was eventually sent to the emergency room and ruled out for CVA or metabolic illness. The MRI of his brain was unremarkable in relation to his age. He is with the nephew today and the story is that the family is worried that he is starting to show signs of dementia. Is that he has been anxious and isolating. He has chronic trouble with insomnia and takes Ambien. He does socialize some but it's mostly related to mealtime.  Outpatient Medications Prior to Visit  Medication Sig Dispense Refill  . losartan (COZAAR) 25 MG tablet TAKE 1 TABLET BY MOUTH DAILY 90 tablet 2  . metoprolol succinate (TOPROL-XL) 50 MG 24 hr tablet TAKE 1 TABLET (50 MG TOTAL) BY MOUTH DAILY. TAKE WITH OR IMMEDIATELY FOLLOWING A MEAL. 90 tablet 2  . rosuvastatin (CRESTOR) 20 MG tablet TAKE 1 TABLET (20 MG TOTAL) BY MOUTH DAILY. 90 tablet 3  . zolpidem (AMBIEN) 10 MG tablet Take 1 tablet (10 mg total) by mouth at bedtime. 30 tablet 5  . Indacaterol-Glycopyrrolate (UTIBRON NEOHALER) 27.5-15.6 MCG CAPS Place 1 puff into inhaler and inhale 2 (two) times daily. 60 capsule 11  . cholecalciferol (VITAMIN D) 1000 UNITS tablet Take 1,000 Units by mouth 2 (two) times daily.    Marland Kitchen aspirin 81 MG EC tablet Take 81 mg by mouth daily.      No facility-administered medications prior to visit.     ROS Review of Systems  Constitutional: Negative.  Negative for activity change, appetite change, diaphoresis and unexpected weight change.  HENT: Negative.   Eyes:  Negative for visual disturbance.  Respiratory: Negative for cough, chest tightness, shortness of breath and wheezing.   Cardiovascular: Negative for chest pain, palpitations and leg swelling.  Gastrointestinal: Negative for abdominal pain, constipation, diarrhea, nausea and vomiting.  Endocrine: Negative.   Genitourinary: Negative.   Musculoskeletal: Negative for back pain, joint swelling, myalgias and neck pain.  Skin: Negative.  Negative for color change and rash.  Allergic/Immunologic: Negative.   Neurological: Negative.  Negative for dizziness, seizures, speech difficulty, weakness, light-headedness, numbness and headaches.  Hematological: Negative for adenopathy. Does not bruise/bleed easily.  Psychiatric/Behavioral: Positive for confusion, decreased concentration, dysphoric mood and sleep disturbance. Negative for agitation, behavioral problems, hallucinations, self-injury and suicidal ideas. The patient is nervous/anxious. The patient is not hyperactive.     Objective:  BP (!) 162/88 (BP Location: Left Arm, Patient Position: Sitting, Cuff Size: Normal)   Pulse 81   Temp 98.2 F (36.8 C) (Oral)   Resp 16   Ht 6' (1.829 m)   Wt 206 lb 8 oz (93.7 kg)   SpO2 96%   BMI 28.01 kg/m   BP Readings from Last 3 Encounters:  04/22/16 (!) 162/88  04/13/16 (!) 168/100  04/13/16 126/70    Wt Readings from Last 3 Encounters:  04/22/16 206 lb 8 oz (93.7 kg)  04/13/16 209 lb (94.8 kg)  04/13/16 209 lb (94.8 kg)    Physical Exam  Constitutional:  He is oriented to person, place, and time. No distress.  HENT:  Mouth/Throat: Oropharynx is clear and moist. No oropharyngeal exudate.  Eyes: Conjunctivae are normal. Right eye exhibits no discharge. Left eye exhibits no discharge. No scleral icterus.  Neck: Normal range of motion. Neck supple. No JVD present. No tracheal deviation present. No thyromegaly present.  Cardiovascular: Normal rate, regular rhythm, normal heart sounds and intact  distal pulses.  Exam reveals no gallop and no friction rub.   No murmur heard. Pulmonary/Chest: Effort normal and breath sounds normal. No stridor. No respiratory distress. He has no wheezes. He has no rales. He exhibits no tenderness.  Abdominal: Soft. Bowel sounds are normal. He exhibits no distension and no mass. There is no tenderness. There is no rebound and no guarding.  Musculoskeletal: Normal range of motion. He exhibits no edema, tenderness or deformity.  Lymphadenopathy:    He has no cervical adenopathy.  Neurological: He is alert and oriented to person, place, and time. He has normal reflexes. He displays normal reflexes. No cranial nerve deficit. He exhibits normal muscle tone. Coordination normal.  Skin: Skin is warm and dry. No rash noted. He is not diaphoretic. No erythema. No pallor.  Psychiatric: Judgment and thought content normal. His mood appears anxious. His affect is not angry. His speech is not delayed and not tangential. He is not agitated, not aggressive, not slowed, not withdrawn and not combative. Cognition and memory are normal. He exhibits a depressed mood. He expresses no homicidal and no suicidal ideation. He expresses no suicidal plans and no homicidal plans. He is inattentive.  Vitals reviewed.   Lab Results  Component Value Date   WBC 7.9 04/13/2016   HGB 13.9 04/13/2016   HCT 41.0 04/13/2016   PLT 279 04/13/2016   GLUCOSE 135 (H) 04/13/2016   CHOL 126 09/27/2015   TRIG 110.0 09/27/2015   HDL 43.40 09/27/2015   LDLCALC 61 09/27/2015   ALT 16 (L) 04/13/2016   AST 19 04/13/2016   NA 141 04/13/2016   K 3.0 (L) 04/13/2016   CL 105 04/13/2016   CREATININE 1.00 04/13/2016   BUN 10 04/13/2016   CO2 24 04/13/2016   TSH 0.49 09/27/2015   PSA 3.05 12/14/2010   INR 1.01 04/13/2016   HGBA1C 6.9 (H) 04/10/2016   MICROALBUR <0.7 09/27/2015    Ct Head Wo Contrast  Result Date: 04/13/2016 CLINICAL DATA:  Episode of confusion today. EXAM: CT HEAD WITHOUT  CONTRAST TECHNIQUE: Contiguous axial images were obtained from the base of the skull through the vertex without intravenous contrast. COMPARISON:  Maxillofacial CT 05/27/2005 FINDINGS: Brain: Ventricles and cisterns are within normal. CSF spaces are within normal. There is evidence of an old left frontal infarct. No mass, mass effect, shift of midline structures or acute hemorrhage. No evidence of acute infarction. Vascular: Calcified plaque over the cavernous segment internal carotid artery's and right vertebral artery. Skull: Within normal. Sinuses/Orbits: Orbits are normal. There is moderate opacification over the ethmoid, sphenoid and left maxillary sinus. Other: None. IMPRESSION: No acute intracranial findings. Old left frontal infarct. Moderate chronic sinus inflammatory disease. Electronically Signed   By: Marin Olp M.D.   On: 04/13/2016 13:23   Mr Brain Wo Contrast  Result Date: 04/13/2016 CLINICAL DATA:  Confusion. EXAM: MRI HEAD WITHOUT CONTRAST TECHNIQUE: Multiplanar, multiecho pulse sequences of the brain and surrounding structures were obtained without intravenous contrast. COMPARISON:  Head CT 04/13/2016 FINDINGS: Brain: There is no evidence of acute infarct, intracranial hemorrhage, mass, midline  shift, or extra-axial fluid collection. A chronic left MCA infarct is again seen involving the posterior left frontal lobe. Mild global cerebral atrophy is within normal limits for age. Small foci of T2 hyperintensity scattered throughout the cerebral white matter bilaterally are nonspecific but compatible with mild chronic small vessel ischemic disease. Vascular: Major intracranial vascular flow voids are preserved, with the right vertebral artery being dominant. Skull and upper cervical spine: No suspicious marrow lesion. Fusion between the C3-4 vertebral bodies and left posterior elements. Sinuses/Orbits: Unremarkable orbits. Near complete opacification of the left maxillary sinus with mucosal  thickening and large volume fluid. Mild-to-moderate mucosal thickening and smaller fluid elsewhere throughout the sinuses. Trace left mastoid effusion. Other: None. IMPRESSION: 1. No acute intracranial abnormality. 2. Mild chronic small vessel ischemic disease. 3. Chronic left frontal lobe infarct. 4. Sinusitis. Electronically Signed   By: Logan Bores M.D.   On: 04/13/2016 16:44    Assessment & Plan:   Devinn was seen today for copd.  Diagnoses and all orders for this visit:  Hypokalemia- I will recheck his potassium level and will also monitor him for hypomagnesemia, in the meantime I have asked him to increase his intake of food and beverages high in potassium like fruits and citric juices -     Basic metabolic panel; Future -     Magnesium; Future  Depression with anxiety- will start sertraline at 50 mg a day, over the next few weeks and months I anticipate increasing the dose. -     sertraline (ZOLOFT) 50 MG tablet; Take 1 tablet (50 mg total) by mouth daily.  Senile dementia without behavioral disturbance- he agrees to see a neurologist to see if there is a medical intervention that will help him. -     Ambulatory referral to Neurology  Coronary artery disease involving native coronary artery of native heart without angina pectoris- he's hadd no recent episodes of angina, I've asked him to start an aspirin for additional risk reduction. -     aspirin 81 MG EC tablet; Take 1 tablet (81 mg total) by mouth daily.   I have discontinued Mr. Torbeck's Indacaterol-Glycopyrrolate. I have also changed his aspirin. Additionally, I am having him start on sertraline. Lastly, I am having him maintain his cholecalciferol, losartan, rosuvastatin, metoprolol succinate, and zolpidem.  Meds ordered this encounter  Medications  . sertraline (ZOLOFT) 50 MG tablet    Sig: Take 1 tablet (50 mg total) by mouth daily.    Dispense:  30 tablet    Refill:  3  . aspirin 81 MG EC tablet    Sig: Take 1 tablet  (81 mg total) by mouth daily.    Dispense:  30 tablet    Refill:  11     Follow-up: No Follow-up on file.  Scarlette Calico, MD

## 2016-04-22 NOTE — Progress Notes (Signed)
Pre visit review using our clinic review tool, if applicable. No additional management support is needed unless otherwise documented below in the visit note. 

## 2016-04-24 NOTE — Patient Instructions (Signed)
Dementia Dementia is the loss of two or more brain functions, such as:  Memory.  Decision making.  Behavior.  Speaking.  Thinking.  Problem solving.  There are many types of dementia. The most common type is called progressive dementia. Progressive dementia gets worse with time and it is irreversible. An example of this type of dementia is Alzheimer disease. What are the causes? This condition may be caused by:  Nerve cell damage in the brain.  Genetic mutations.  Certain medicines.  Multiple small strokes.  An infection, such as chronic meningitis.  A metabolic problem, such as vitamin B12 deficiency or thyroid disease.  Pressure on the brain, such as from a tumor or blood clot.  What are the signs or symptoms? Symptoms of this condition include:  Sudden changes in mood.  Depression.  Problems with balance.  Changes in personality.  Poor short-term memory.  Agitation.  Delusions.  Hallucinations.  Having a hard time: ? Speaking thoughts. ? Finding words. ? Solving problems. ? Doing familiar tasks. ? Understanding familiar ideas.  How is this diagnosed? This condition is diagnosed with an assessment by your health care provider. During this assessment, your health care provider will talk with you and your family, friends, or caregivers about your symptoms. A thorough medical history will be taken, and you will have a physical exam and tests. Tests may include:  Lab tests, such as blood or urine tests.  Imaging tests, such as a CT scan, PET scan, or MRI.  A lumbar puncture. This test involves removing and testing a small amount of the fluid that surrounds the brain and spinal cord.  An electroencephalogram (EEG). In this test, small metal discs are used to measure electrical activity in the brain.  Memory tests, cognitive tests, and neuropsychological tests. These tests evaluate brain function.  How is this treated? Treatment depends on the  cause of the dementia. It may involve taking medicines that may help:  To control the dementia.  To slow down the disease.  To manage symptoms.  In some cases, treating the cause of the dementia can improve symptoms, reverse symptoms, or slow down how quickly the dementia gets worse. Your health care provider can help direct you to support groups, organizations, and other health care providers who can help with decisions about your care. Follow these instructions at home: Medicine  Take over-the-counter and prescription medicines only as told by your health care provider.  Avoid taking medicines that can affect thinking, such as pain or sleeping medicines. Lifestyle   Make healthy lifestyle choices: ? Be physically active as told by your health care provider. ? Do not use any tobacco products, such as cigarettes, chewing tobacco, and e-cigarettes. If you need help quitting, ask your health care provider. ? Eat a healthy diet. ? Practice stress-management techniques when you get stressed. ? Stay social.  Drink enough fluid to keep your urine clear or pale yellow.  Make sure to get quality sleep. These tips can help you to get a good night's rest: ? Avoid napping during the day. ? Keep your sleeping area dark and cool. ? Avoid exercising during the few hours before you go to bed. ? Avoid caffeine products in the evening. General instructions  Work with your health care provider to determine what you need help with and what your safety needs are.  If you were given a bracelet that tracks your location, make sure to wear it.  Keep all follow-up visits as told by your   health care provider. This is important. Contact a health care provider if:  You have any new symptoms.  You have problems with choking or swallowing.  You have any symptoms of a different illness. Get help right away if:  You develop a fever.  You have new or worsening confusion.  You have new or  worsening sleepiness.  You have a hard time staying awake.  You or your family members become concerned for your safety. This information is not intended to replace advice given to you by your health care provider. Make sure you discuss any questions you have with your health care provider. Document Released: 07/10/2000 Document Revised: 05/25/2015 Document Reviewed: 10/12/2014 Elsevier Interactive Patient Education  2017 [employer]  

## 2016-04-25 ENCOUNTER — Encounter: Payer: Self-pay | Admitting: Neurology

## 2016-04-25 ENCOUNTER — Ambulatory Visit: Payer: Medicare HMO | Admitting: Internal Medicine

## 2016-05-01 ENCOUNTER — Telehealth: Payer: Self-pay

## 2016-05-01 NOTE — Telephone Encounter (Signed)
Called Alfred Armstrong (pt [employer]) and lvmm for to call back regarding what to do with the forms that we have.

## 2016-05-10 NOTE — Telephone Encounter (Signed)
Mail originals of the [employer]CP[employer] forms as requested.

## 2016-05-17 ENCOUNTER — Telehealth: Payer: Self-pay | Admitting: Internal Medicine

## 2016-05-17 NOTE — Telephone Encounter (Signed)
Pt called stating that he had a message on his phone from someone about 2 medication refills. Was this you?

## 2016-05-20 NOTE — Telephone Encounter (Signed)
I spoke to him several weeks ago and all was handled. RE: [employer]CP[employer] forms.   RF calls were not me. Closing this note.

## 2016-05-22 ENCOUNTER — Ambulatory Visit: Payer: Medicare HMO | Admitting: Podiatry

## 2016-05-27 ENCOUNTER — Encounter: Payer: Self-pay | Admitting: Neurology

## 2016-05-27 ENCOUNTER — Ambulatory Visit (INDEPENDENT_AMBULATORY_CARE_PROVIDER_SITE_OTHER): Payer: Medicare HMO | Admitting: Neurology

## 2016-05-27 VITALS — BP 142/90 | HR 81 | Temp 98.5°F | Ht 72.0 in | Wt 210.0 lb

## 2016-05-27 DIAGNOSIS — G3184 Mild cognitive impairment, so stated: Secondary | ICD-10-CM

## 2016-05-27 NOTE — Patient Instructions (Signed)
1. Continue to monitor memory 2. Recommend DMV evaluation for driving 3. It is very important to restart all your medications to help prevent another stroke, call Dr. Ronnald Ramp to refill all your medications 4. Follow-up in 6 months, call for any changes

## 2016-05-27 NOTE — Progress Notes (Signed)
NEUROLOGY CONSULTATION NOTE  DAMIRE REMEDIOS MRN: [id number] DOB: [birth year]  Referring provider: [name] Primary care provider: [name]  Reason for consult:  dementia  Dear Dr Ronnald Ramp:  Thank you for your kind referral of Alfred Armstrong for consultation of the above symptoms. Although his history is well known to you, please allow me to reiterate it for the purpose of our medical record. The patient was accompanied to the clinic by sister who also provides collateral information. Records and images were personally reviewed where available.  HISTORY OF PRESENT ILLNESS: This is an 83 year old right-handed man with a history of hypertension, hyperlipidemia, CAD, COPD, diabetes, MGUS, presenting for evaluation of worsening memory. He was in the ER on 04/13/16 noted to be confused. He states he was driving by the hospital and decided to go to the clinic and re-confirm his appointment. He states "they did not like my color" and checked him in. Per clinic notes, he presented as a walk-in due to confusion for 2 days. He was started on a new inhaler and reported it felt a little strong for him, making him cough and feel cloudy. He walked into the clinic unaware it was not routine clinic hours. He repeated himself many times, asking the same questions to front desk staff, even if they had just answered them for him. He was unaware he already asked them and did not recall their conversation. He was noted to ask appropriate questions and seem reasonably mentally intact, however he could not recall the complete conversation a few minutes prior and asked again the same questions. EMS was called and he was transferred to [facility] where he was noted to be fully oriented but could not remember why he was sent to the ER. Family arrived and reported short term memory changes 3 months ago or so. I personally reviewed MRI brain without contrast which did not show any acute changes, there was a  chronic left MCA infarct involving the posterior left frontal lobe, mild global cerebral atrophy, mild chronic microvascular disease. CBC and CMP were normal except for low potassium 2.9. Urinalysis and UDS negative. He was referred to our clinic for evaluation of possible dementia.  He states he has not noticed any memory changes. He lives alone and denies getting lost driving, no missed bills. He stopped his medications 3-4 months ago, "just sort of forgot," then the next thing he knew, he did not think he needed them anymore because he was not feeling any different off the medications. His sister who lives out of town has noticed he would repeat himself over the past year. She arrived in [place] yesterday and when they were given directions to the restaurant to meet family, he could not get it and ended up in the wrong direction. Relatives in [place] have also noticed changes. His sister denies any hygiene issues when she visited his house, he has a cleaning lady coming once a month. She has noticed some personality changes, she thinks he is afraid, and this gets him stressed and his temper going. He was furious at her yesterday when she came to accompany him to the clinic visit stating she was getting involved in his business. Once they sat down and talked, he calmed down.   He denies any headaches, dizziness, diplopia, dysarthria, dysphagia, neck/back pain, focal numbness/tingling/weakness, bowel/bladder dysfunction. No anosmia, tremors, no falls. He denies any significant head injuries, no alcohol intake. No family history of dementia.  PAST  MEDICAL HISTORY: Past Medical History:  Diagnosis Date  . Anxiety    related to hosp. enviromental   . CAD (coronary artery disease)    Dr. Aundra Dubin  . Cancer ([place])    skin  . Carotid stenosis    Followed by Dr Amedeo Plenty - Angiogram 5/10 showed left common carotid to be totally occluded and 50% RICA stenosis  . COPD (chronic obstructive pulmonary disease)  ([place])   . CVA (cerebral infarction)   . Diabetes mellitus without complication ([place])   . GERD (gastroesophageal reflux disease)   . H/O hiatal hernia   . Headache(784.0)    annoyance related to enviromental allergies   . History of nephrolithiasis   . HTN (hypertension)    [facility]I Cough  . Hyperlipidemia   . MGUS (monoclonal gammopathy of unknown significance) 06/17/2011  . Neuromuscular disorder (H[facility])    neuropathy  . Osteoarthrosis, unspecified whether generalized or localized, unspecified site   . Shingles   . Shortness of breath    uses symbicort daily, when taking the garbage   . Stroke ([place])   . Tubulovillous adenoma polyp of colon    w/HGD 1999    PAST SURGICAL HISTORY: Past Surgical History:  Procedure Laterality Date  . Carotid artery angiogram  04/2008  . CAROTID ENDARTERECTOMY    . CORONARY ANGIOPLASTY  04/2008  . CORONARY ARTERY BYPASS GRAFT  1998   [facility]. in [place]   . ENDARTERECTOMY Right 06/18/2012   Procedure: ENDARTERECTOMY CAROTID;  Surgeon: Angelia Mould, MD;  Location: [facility];  Service: Vascular;  Laterality: Right;  with resection of redundant Right Internal Carotid Artery  . PATCH ANGIOPLASTY Right 06/18/2012   Procedure: PATCH ANGIOPLASTY;  Surgeon: Angelia Mould, MD;  [facility];  Service: Vascular;  Laterality: Right;  using Hemashield Platinum Finesse Patch  . TONSILLECTOMY  1941    MEDICATIONS: Current Outpatient Prescriptions on File Prior to Visit  Medication Sig Dispense Refill  . aspirin 81 MG EC tablet Take 1 tablet (81 mg total) by mouth daily. 30 tablet 11  . cholecalciferol (VITAMIN D) 1000 UNITS tablet Take 1,000 Units by mouth 2 (two) times daily.    Marland Kitchen losartan (COZAAR) 25 MG tablet TAKE 1 TABLET BY MOUTH DAILY 90 tablet 2  . metoprolol succinate (TOPROL-XL) 50 MG 24 hr tablet TAKE 1 TABLET (50 MG TOTAL) BY MOUTH DAILY. TAKE WITH OR IMMEDIATELY FOLLOWING A MEAL. 90 tablet 2  . rosuvastatin (CRESTOR) 20  MG tablet TAKE 1 TABLET (20 MG TOTAL) BY MOUTH DAILY. 90 tablet 3  . sertraline (ZOLOFT) 50 MG tablet Take 1 tablet (50 mg total) by mouth daily. 30 tablet 3  . zolpidem (AMBIEN) 10 MG tablet Take 1 tablet (10 mg total) by mouth at bedtime. 30 tablet 5  . [DISCONTINUED] Hypertonic Nasal Wash (SINUS RINSE BOTTLE KIT NA) by Nasal route as directed.       No current facility-administered medications on file prior to visit.     ALLERGIES: Allergies  Allergen Reactions  . Tiotropium Bromide Monohydrate     Dry mouth  . Daliresp [Roflumilast]     Bad thoughts/dreams    FAMILY HISTORY: Family History  Problem Relation Age of Onset  . Alcohol abuse Mother   . Alcohol abuse Father   . Colon cancer Neg Hx     SOCIAL HISTORY: Social History   Social History  . Marital status: Single    Spouse name: N/A  . Number of children: N/A  .  Years of education: N/A   Occupational History  . Retired Retired  . Former [employer] information [employer] company in [place]  . Smoking status: Former Smoker    Types: Cigarettes    Quit date: 01/28/1993  . Smokeless tobacco: Never Used  . Alcohol use No     Comment: stopped drinking many yrs. ago   . Drug use: No  . Sexual activity: Not Currently   Other Topics Concern  . Not on file   Social History Narrative   Regular Exercise -  YES   Daily Caffeine Use:  2-3 cups/WEEKLY             REVIEW OF SYSTEMS: Constitutional: No fevers, chills, or sweats, no generalized fatigue, change in appetite Eyes: No visual changes, double vision, eye pain Ear, nose and throat: No hearing loss, ear pain, nasal congestion, sore throat Cardiovascular: No chest pain, palpitations Respiratory:  No shortness of breath at rest or with exertion, wheezes GastrointestinaI: No nausea, vomiting, diarrhea, abdominal pain, fecal incontinence Genitourinary:  No dysuria, urinary retention or frequency Musculoskeletal:  No neck pain, back  pain Integumentary: No rash, pruritus, skin lesions Neurological: as above Psychiatric: No depression, insomnia, anxiety Endocrine: No palpitations, fatigue, diaphoresis, mood swings, change in appetite, change in weight, increased thirst Hematologic/Lymphatic:  No anemia, purpura, petechiae. Allergic/Immunologic: no itchy/runny eyes, nasal congestion, recent allergic reactions, rashes  PHYSICAL EXAM: Vitals:   05/27/16 1421  BP: (!) 142/90  Pulse: 81  Temp: 98.5 F (36.9 C)   General: No acute distress Head:  Normocephalic/atraumatic Eyes: Fundoscopic exam shows bilateral sharp discs, no vessel changes, exudates, or hemorrhages Neck: supple, no paraspinal tenderness, full range of motion Back: No paraspinal tenderness Heart: regular rate and rhythm Lungs: Clear to auscultation bilaterally. Vascular: No carotid bruits. Skin/Extremities: No rash, no edema Neurological Exam: Mental status: alert and oriented to person, place, and time, no dysarthria or aphasia, Fund of knowledge is appropriate.  Remote memory intact.  Attention and concentration are normal.    Able to name objects and repeat phrases.  Montreal Cognitive Assessment  05/27/2016  Visuospatial/ Executive (0/5) 5  Naming (0/3) 3  Attention: Read list of digits (0/2) 2  Attention: Read list of letters (0/1) 1  Attention: Serial 7 subtraction starting at 100 (0/3) 1  Language: Repeat phrase (0/2) 2  Language : Fluency (0/1) 0  Abstraction (0/2) 2  Delayed Recall (0/5) 1  Orientation (0/6) 6  Total 23   Cranial nerves: CN I: not tested CN II: pupils equal, round and reactive to light, visual fields intact, fundi unremarkable. CN III, IV, VI:  full range of motion, no nystagmus, no ptosis CN V: decreased pin on left V2, intact cold CN VII: upper and lower face symmetric CN VIII: hearing intact to finger rub CN IX, X: gag intact, uvula midline CN XI: sternocleidomastoid and trapezius muscles intact CN XII: tongue  midline Bulk & Tone: normal, no fasciculations. Motor: 5/5 throughout with no pronator drift. Sensation: decreased pin on left UE, decreased vibration to left knee. Romberg test negative Deep Tendon Reflexes: +2 throughout, no ankle clonus Plantar responses: downgoing bilaterally Cerebellar: no incoordination on finger to nose, heel to shin. No dysdiadochokinesia Gait: narrow-based and steady, able to tandem walk adequately. Tremor: none  IMPRESSION: This is an 83 year old right-handed man with a history of hypertension, hyperlipidemia, CAD, COPD, diabetes, left frontal stroke, MGUS, presenting for evaluation of worsening memory. He was sent  to the ED last 3/17 after he went to his PCP office outside of clinic hours and was noted to have some confusion. MRI brain did not show any acute changes, there was an old left frontal stroke. His MOCA score today is 23/30, indicating mild cognitive impairment. He appears to be able to perform all ADLs independently, living alone currently. We discussed cholinesterase inhibitors such as Aricept, side effects and expectations from the medication. He declines to start medication at this time. We discussed the importance of control of vascular risk factors for brain health, as well as secondary stroke prevention. He repeatedly asked me if calling his PCP Dr. Ronnald Ramp would get him the medication refills needed. We discussed driving, recommend DMV driving evaluation. His family will be keeping a closer eye on him. He will follow-up in 6 months and knows to call for any changes.   Thank you for allowing me to participate in the care of this patient. Please do not hesitate to call for any questions or concerns.   Ellouise Newer, M.D.  CC: Dr. Ronnald Ramp

## 2016-05-28 ENCOUNTER — Telehealth: Payer: Self-pay | Admitting: Internal Medicine

## 2016-05-28 NOTE — Telephone Encounter (Signed)
Would like call back about medications. A bit confused on what he is taking.

## 2016-05-28 NOTE — Telephone Encounter (Signed)
LVM for pt to call back as soon as possible.   

## 2016-06-04 DIAGNOSIS — G3184 Mild cognitive impairment, so stated: Secondary | ICD-10-CM | POA: Insufficient documentation

## 2016-06-21 ENCOUNTER — Encounter: Payer: Self-pay | Admitting: Internal Medicine

## 2016-06-25 ENCOUNTER — Ambulatory Visit: Payer: Medicare HMO | Admitting: Internal Medicine

## 2016-07-02 ENCOUNTER — Ambulatory Visit: Payer: Medicare HMO | Admitting: Internal Medicine

## 2016-08-12 ENCOUNTER — Ambulatory Visit (INDEPENDENT_AMBULATORY_CARE_PROVIDER_SITE_OTHER): Payer: Medicare HMO | Admitting: Internal Medicine

## 2016-08-12 ENCOUNTER — Encounter: Payer: Self-pay | Admitting: Internal Medicine

## 2016-08-12 ENCOUNTER — Other Ambulatory Visit (INDEPENDENT_AMBULATORY_CARE_PROVIDER_SITE_OTHER): Payer: Medicare HMO

## 2016-08-12 VITALS — BP 120/70 | HR 82 | Temp 98.4°F | Resp 16 | Ht 72.0 in | Wt 203.8 lb

## 2016-08-12 DIAGNOSIS — I2510 Atherosclerotic heart disease of native coronary artery without angina pectoris: Secondary | ICD-10-CM

## 2016-08-12 DIAGNOSIS — E118 Type 2 diabetes mellitus with unspecified complications: Secondary | ICD-10-CM

## 2016-08-12 DIAGNOSIS — I10 Essential (primary) hypertension: Secondary | ICD-10-CM | POA: Diagnosis not present

## 2016-08-12 DIAGNOSIS — E876 Hypokalemia: Secondary | ICD-10-CM

## 2016-08-12 DIAGNOSIS — E785 Hyperlipidemia, unspecified: Secondary | ICD-10-CM | POA: Diagnosis not present

## 2016-08-12 LAB — BASIC METABOLIC PANEL
BUN: 16 mg/dL (ref 6–23)
CO2: 26 meq/L (ref 19–32)
Calcium: 9.7 mg/dL (ref 8.4–10.5)
Chloride: 104 mEq/L (ref 96–112)
Creatinine, Ser: 1.26 mg/dL (ref 0.40–1.50)
GFR: 58.13 mL/min — ABNORMAL LOW (ref >60.00)
GLUCOSE: 110 mg/dL — AB (ref 70–99)
Potassium: 3.5 mEq/L (ref 3.5–5.1)
SODIUM: 139 meq/L (ref 135–145)

## 2016-08-12 LAB — TSH: TSH: 0.78 u[IU]/mL (ref 0.35–4.50)

## 2016-08-12 LAB — COMPREHENSIVE METABOLIC PANEL
ALBUMIN: 4.3 g/dL (ref 3.5–5.2)
ALK PHOS: 52 U/L (ref 39–117)
ALT: 8 U/L (ref 0–53)
AST: 11 U/L (ref 0–37)
BILIRUBIN TOTAL: 0.8 mg/dL (ref 0.2–1.2)
BUN: 16 mg/dL (ref 6–23)
CALCIUM: 9.7 mg/dL (ref 8.4–10.5)
CO2: 26 meq/L (ref 19–32)
CREATININE: 1.26 mg/dL (ref 0.40–1.50)
Chloride: 104 mEq/L (ref 96–112)
GFR: 58.13 mL/min — ABNORMAL LOW (ref >60.00)
Glucose, Bld: 110 mg/dL — ABNORMAL HIGH (ref 70–99)
Potassium: 3.5 mEq/L (ref 3.5–5.1)
Sodium: 139 mEq/L (ref 135–145)
TOTAL PROTEIN: 7.6 g/dL (ref 6.0–8.3)

## 2016-08-12 LAB — CBC WITH DIFFERENTIAL/PLATELET
BASOS ABS: 0.1 10*3/uL (ref 0.0–0.1)
Basophils Relative: 0.7 % (ref 0.0–3.0)
EOS ABS: 0.1 10*3/uL (ref 0.0–0.7)
Eosinophils Relative: 1.4 % (ref 0.0–5.0)
HEMATOCRIT: 46 % (ref 39.0–52.0)
Hemoglobin: 15.7 g/dL (ref 13.0–17.0)
LYMPHS PCT: 18.7 % (ref 12.0–46.0)
Lymphs Abs: 1.7 10*3/uL (ref 0.7–4.0)
MCHC: 34.2 g/dL (ref 30.0–36.0)
MCV: 88.6 fl (ref 78.0–100.0)
Monocytes Absolute: 0.6 10*3/uL (ref 0.1–1.0)
Monocytes Relative: 6.8 % (ref 3.0–12.0)
NEUTROS ABS: 6.7 10*3/uL (ref 1.4–7.7)
Neutrophils Relative %: 72.4 % (ref 43.0–77.0)
PLATELETS: 244 10*3/uL (ref 150.0–400.0)
RBC: 5.19 Mil/uL (ref 4.22–5.81)
RDW: 14 % (ref 11.5–15.5)
WBC: 9.3 10*3/uL (ref 4.0–10.5)

## 2016-08-12 LAB — LIPID PANEL
CHOLESTEROL: 174 mg/dL (ref 0–200)
HDL: 46.6 mg/dL (ref >39.00)
LDL Cholesterol: 106 mg/dL — ABNORMAL HIGH (ref 0–99)
NonHDL: 127.45
TRIGLYCERIDES: 107 mg/dL (ref 0.0–149.0)
Total CHOL/HDL Ratio: 4
VLDL: 21.4 mg/dL (ref 0.0–40.0)

## 2016-08-12 LAB — MAGNESIUM: MAGNESIUM: 2.3 mg/dL (ref 1.5–2.5)

## 2016-08-12 LAB — HEMOGLOBIN A1C: Hgb A1c MFr Bld: 6.6 % — ABNORMAL HIGH (ref 4.6–6.5)

## 2016-08-12 NOTE — Progress Notes (Signed)
Subjective:  Patient ID: Alfred Armstrong, male    DOB: [birth year]  Age: 83 y.o. MRN: [id number]  CC: Hypertension; Hyperlipidemia; Diabetes; and Coronary Artery Disease   HPI Alfred Armstrong presents for f/up - he offers no new complaints today. He is with his nephew today and he says that his cognitive skills are waxing and waning but overall improved over the last month or 2.  Outpatient Medications Prior to Visit  Medication Sig Dispense Refill  . aspirin 81 MG EC tablet Take 1 tablet (81 mg total) by mouth daily. 30 tablet 11  . cholecalciferol (VITAMIN D) 1000 UNITS tablet Take 1,000 Units by mouth 2 (two) times daily.    Marland Kitchen losartan (COZAAR) 25 MG tablet TAKE 1 TABLET BY MOUTH DAILY 90 tablet 2  . metoprolol succinate (TOPROL-XL) 50 MG 24 hr tablet TAKE 1 TABLET (50 MG TOTAL) BY MOUTH DAILY. TAKE WITH OR IMMEDIATELY FOLLOWING A MEAL. 90 tablet 2  . rosuvastatin (CRESTOR) 20 MG tablet TAKE 1 TABLET (20 MG TOTAL) BY MOUTH DAILY. 90 tablet 3  . sertraline (ZOLOFT) 50 MG tablet Take 1 tablet (50 mg total) by mouth daily. 30 tablet 3  . zolpidem (AMBIEN) 10 MG tablet Take 1 tablet (10 mg total) by mouth at bedtime. 30 tablet 5   No facility-administered medications prior to visit.     ROS Review of Systems  Constitutional: Negative.  Negative for chills, diaphoresis, fatigue and unexpected weight change.  HENT: Negative.  Negative for trouble swallowing.   Eyes: Negative for visual disturbance.  Respiratory: Negative for cough, chest tightness, shortness of breath and wheezing.   Cardiovascular: Negative for chest pain, palpitations and leg swelling.  Gastrointestinal: Negative for abdominal pain, constipation, diarrhea, nausea and vomiting.  Endocrine: Negative.  Negative for polydipsia, polyphagia and polyuria.  Genitourinary: Negative.   Musculoskeletal: Negative.  Negative for back pain and myalgias.  Skin: Negative.   Allergic/Immunologic: Negative.   Neurological: Negative.   Negative for dizziness, weakness, light-headedness and headaches.  Hematological: Negative.   Psychiatric/Behavioral: Positive for confusion, decreased concentration and sleep disturbance. Negative for agitation, behavioral problems, dysphoric mood, hallucinations, self-injury and suicidal ideas. The patient is nervous/anxious. The patient is not hyperactive.     Objective:  BP 120/70 (BP Location: Left Arm, Patient Position: Sitting, Cuff Size: Normal)   Pulse 82   Temp 98.4 F (36.9 C) (Oral)   Ht 6' (1.829 m)   Wt 203 lb 12 oz (92.4 kg)   SpO2 98%   BMI 27.63 kg/m   BP Readings from Last 3 Encounters:  08/12/16 120/70  05/27/16 (!) 142/90  04/22/16 (!) 162/88    Wt Readings from Last 3 Encounters:  08/12/16 203 lb 12 oz (92.4 kg)  05/27/16 210 lb (95.3 kg)  04/22/16 206 lb 8 oz (93.7 kg)    Physical Exam  Constitutional: He is oriented to person, place, and time. No distress.  HENT:  Mouth/Throat: Oropharynx is clear and moist. No oropharyngeal exudate.  Eyes: Conjunctivae are normal. Right eye exhibits no discharge. Left eye exhibits no discharge. No scleral icterus.  Neck: Normal range of motion. Neck supple. No JVD present. No thyromegaly present.  Cardiovascular: Normal rate and regular rhythm.  Exam reveals no gallop and no friction rub.   No murmur heard. Pulmonary/Chest: Effort normal and breath sounds normal. No respiratory distress. He has no wheezes. He has no rales. He exhibits no tenderness.  Abdominal: Soft. Bowel sounds are normal. He exhibits no distension and  no mass. There is no tenderness. There is no rebound and no guarding.  Musculoskeletal: Normal range of motion. He exhibits no edema, tenderness or deformity.  Lymphadenopathy:    He has no cervical adenopathy.  Neurological: He is alert and oriented to person, place, and time.  Skin: Skin is warm and dry. No rash noted. He is not diaphoretic. No erythema. No pallor.  Vitals reviewed.   Lab  Results  Component Value Date   WBC 7.9 04/13/2016   HGB 13.9 04/13/2016   HCT 41.0 04/13/2016   PLT 279 04/13/2016   GLUCOSE 135 (H) 04/13/2016   CHOL 126 09/27/2015   TRIG 110.0 09/27/2015   HDL 43.40 09/27/2015   LDLCALC 61 09/27/2015   ALT 16 (L) 04/13/2016   AST 19 04/13/2016   NA 141 04/13/2016   K 3.0 (L) 04/13/2016   CL 105 04/13/2016   CREATININE 1.00 04/13/2016   BUN 10 04/13/2016   CO2 24 04/13/2016   TSH 0.49 09/27/2015   PSA 3.05 12/14/2010   INR 1.01 04/13/2016   HGBA1C 6.9 (H) 04/10/2016   MICROALBUR <0.7 09/27/2015    Ct Head Wo Contrast  Result Date: 04/13/2016 CLINICAL DATA:  Episode of confusion today. EXAM: CT HEAD WITHOUT CONTRAST TECHNIQUE: Contiguous axial images were obtained from the base of the skull through the vertex without intravenous contrast. COMPARISON:  Maxillofacial CT 05/27/2005 FINDINGS: Brain: Ventricles and cisterns are within normal. CSF spaces are within normal. There is evidence of an old left frontal infarct. No mass, mass effect, shift of midline structures or acute hemorrhage. No evidence of acute infarction. Vascular: Calcified plaque over the cavernous segment internal carotid artery's and right vertebral artery. Skull: Within normal. Sinuses/Orbits: Orbits are normal. There is moderate opacification over the ethmoid, sphenoid and left maxillary sinus. Other: None. IMPRESSION: No acute intracranial findings. Old left frontal infarct. Moderate chronic sinus inflammatory disease. Electronically Signed   By: Marin Olp M.D.   On: 04/13/2016 13:23   Mr Brain Wo Contrast  Result Date: 04/13/2016 CLINICAL DATA:  Confusion. EXAM: MRI HEAD WITHOUT CONTRAST TECHNIQUE: Multiplanar, multiecho pulse sequences of the brain and surrounding structures were obtained without intravenous contrast. COMPARISON:  Head CT 04/13/2016 FINDINGS: Brain: There is no evidence of acute infarct, intracranial hemorrhage, mass, midline shift, or extra-axial fluid  collection. A chronic left MCA infarct is again seen involving the posterior left frontal lobe. Mild global cerebral atrophy is within normal limits for age. Small foci of T2 hyperintensity scattered throughout the cerebral white matter bilaterally are nonspecific but compatible with mild chronic small vessel ischemic disease. Vascular: Major intracranial vascular flow voids are preserved, with the right vertebral artery being dominant. Skull and upper cervical spine: No suspicious marrow lesion. Fusion between the C3-4 vertebral bodies and left posterior elements. Sinuses/Orbits: Unremarkable orbits. Near complete opacification of the left maxillary sinus with mucosal thickening and large volume fluid. Mild-to-moderate mucosal thickening and smaller fluid elsewhere throughout the sinuses. Trace left mastoid effusion. Other: None. IMPRESSION: 1. No acute intracranial abnormality. 2. Mild chronic small vessel ischemic disease. 3. Chronic left frontal lobe infarct. 4. Sinusitis. Electronically Signed   By: Logan Bores M.D.   On: 04/13/2016 16:44    Assessment & Plan:   Alfred Armstrong was seen today for hypertension, hyperlipidemia, diabetes and coronary artery disease.  Diagnoses and all orders for this visit:  Coronary artery disease involving native coronary artery of native heart without angina pectoris- he has had no recent episodes of angina. Will continue  aggressive risk factor modification. -     Lipid panel; Future  HYPERTENSION, BENIGN ESSENTIAL- his blood pressure is adequately well controlled. I will monitor his electrolytes and renal function. -     Comprehensive metabolic panel; Future -     CBC with Differential/Platelet; Future -     Urinalysis, Routine w reflex microscopic; Future  Type 2 diabetes mellitus with complication, without long-term current use of insulin ([place])- I've asked him to have his annual exam performed. Will recheck his A1c today and will change his medications if  needed. -     Hemoglobin A1c; Future -     Microalbumin / creatinine urine ratio; Future -     Ambulatory referral to Ophthalmology  Hyperlipidemia LDL goal <70- he is doing well on the statin, I will recheck his fasting lipid panel to see if he has achieved his goal. -     Lipid panel; Future -     TSH; Future   I am having Alfred Armstrong maintain his cholecalciferol, losartan, rosuvastatin, metoprolol succinate, zolpidem, sertraline, and aspirin.  No orders of the defined types were placed in this encounter.    Follow-up: No Follow-up on file.  Scarlette Calico, MD

## 2016-08-12 NOTE — Patient Instructions (Signed)

## 2016-08-13 ENCOUNTER — Encounter: Payer: Self-pay | Admitting: Internal Medicine

## 2016-08-13 LAB — MICROALBUMIN / CREATININE URINE RATIO
Creatinine,U: 144.1 mg/dL
MICROALB/CREAT RATIO: 1.9 mg/g (ref 0.0–30.0)
Microalb, Ur: 2.7 mg/dL — ABNORMAL HIGH (ref 0.0–1.9)

## 2016-08-13 LAB — URINALYSIS, ROUTINE W REFLEX MICROSCOPIC
BILIRUBIN URINE: NEGATIVE
Hgb urine dipstick: NEGATIVE
Ketones, ur: NEGATIVE
Leukocytes, UA: NEGATIVE
NITRITE: NEGATIVE
PH: 5.5 (ref 5.0–8.0)
RBC / HPF: NONE SEEN (ref 0–?)
Specific Gravity, Urine: 1.025 (ref 1.000–1.030)
TOTAL PROTEIN, URINE-UPE24: NEGATIVE
Urine Glucose: 100 — AB
Urobilinogen, UA: 0.2 (ref 0.0–1.0)

## 2016-09-02 DIAGNOSIS — Z79899 Other long term (current) drug therapy: Secondary | ICD-10-CM | POA: Diagnosis not present

## 2016-09-02 DIAGNOSIS — Z6827 Body mass index (BMI) 27.0-27.9, adult: Secondary | ICD-10-CM | POA: Diagnosis not present

## 2016-09-02 DIAGNOSIS — Z0000 Encounter for general adult medical examination without abnormal findings: Secondary | ICD-10-CM | POA: Diagnosis not present

## 2016-09-02 DIAGNOSIS — I10 Essential (primary) hypertension: Secondary | ICD-10-CM | POA: Diagnosis not present

## 2016-09-02 DIAGNOSIS — I739 Peripheral vascular disease, unspecified: Secondary | ICD-10-CM | POA: Diagnosis not present

## 2016-09-02 DIAGNOSIS — H9110 Presbycusis, unspecified ear: Secondary | ICD-10-CM | POA: Diagnosis not present

## 2016-09-02 DIAGNOSIS — R69 Illness, unspecified: Secondary | ICD-10-CM | POA: Diagnosis not present

## 2016-09-02 DIAGNOSIS — E7800 Pure hypercholesterolemia, unspecified: Secondary | ICD-10-CM | POA: Diagnosis not present

## 2016-09-02 DIAGNOSIS — G4700 Insomnia, unspecified: Secondary | ICD-10-CM | POA: Diagnosis not present

## 2016-09-02 DIAGNOSIS — Z7982 Long term (current) use of aspirin: Secondary | ICD-10-CM | POA: Diagnosis not present

## 2016-11-26 ENCOUNTER — Ambulatory Visit: Payer: Medicare HMO | Admitting: Neurology

## 2016-12-16 ENCOUNTER — Ambulatory Visit: Payer: Medicare HMO | Admitting: Neurology

## 2016-12-17 ENCOUNTER — Ambulatory Visit: Payer: Medicare HMO | Admitting: Internal Medicine

## 2016-12-25 ENCOUNTER — Ambulatory Visit: Payer: Medicare HMO | Admitting: Internal Medicine

## 2017-01-08 ENCOUNTER — Ambulatory Visit: Payer: Medicare HMO | Admitting: Neurology

## 2017-06-24 ENCOUNTER — Telehealth: Payer: Self-pay | Admitting: Internal Medicine

## 2017-06-24 NOTE — Telephone Encounter (Signed)
Spoke to Lelan Pons (pt sister). She will get patient in for an appointment to address his issues.

## 2017-06-24 NOTE — Telephone Encounter (Signed)
Copied from [place] [phone]. Topic: Quick Communication - See Telephone Encounter >> Jun 24, 2017  2:09 PM Vernona Rieger wrote: CRM for notification. See Telephone encounter for: 06/24/17.  Alfred Armstrong ( sister, on [place] and POA ) would like the nurse to call her back in regards to many questions she has about how he is not taking his medications. She lives in [place], [place] and is very concerned about his health. She thinks that he is needing some home health agency. Call back @ [phone]

## 2017-06-27 ENCOUNTER — Ambulatory Visit: Payer: Self-pay

## 2017-06-27 NOTE — Telephone Encounter (Signed)
Phone call from pt's sister.  Reported she lives in [place], [place], and has not seen her brother for approx. 2 mos., until Wed. 5/29. Voiced concern for significant changes in overall condition.  Reported he has very slow speech, weakness of his voice, and very significant weight loss of about 25 lbs. Also reported he appears to be overall weaker.  Stated he has always been very meticulous with his home and that has changed dramatically.  Stated the pt. Does have dementia, and  Has been able to function independently for quite awhile.  Reported it is obvious that something has changed now.  Stated "I don't know if he has had a stroke.  I know he hasn't been seen in office for 1-1.5 years, and has run out of his medications.  I'm concerned that he possibly has had a health decline from not taking the prescribed medication."  Sister unable to know if pt. Is having any symptoms of dizziness, blurred vision.  Reported he has shown general weakness, but not unilateral weakness.  Stated he will not tell us if he is having any problems, as he does not want to go to the hospital, or not be able to live in his own home.  Sister stated she called earlier and the earliest appt. with Dr. Ronnald Ramp is 6/26; asking if he can get in to see him sooner.  Stated he trusts Dr. Ronnald Ramp, and that he doesn't trust many people.  Advised will send message to the office to see if he can be worked in sooner than 6/26.  Advised to take pt. To ER if any signs of TIA /stroke.  Sister verb. Understanding.   Returned call to pt's sister after reviewing "weakness" protocol.  Left voice message to have her take pt. To ER for evaluation, as he will continue to lose ground, the longer there is delay in diagnosing underlying cause.                   Reason for Disposition . [1] MODERATE weakness (i.e., interferes with work, school, normal activities) AND [2] cause unknown  (Exceptions: weakness with acute minor illness, or weakness from poor fluid  intake)  Answer Assessment - Initial Assessment Questions 1. SYMPTOM: "What is the main symptom you are concerned about?" (e.g., weakness, numbness)     Noted change in condition; speech very slow, voice weak, very sleepy most of the time, overall weak, significant weight loss.  2. ONSET: "When did this start?" (minutes, hours, days; while sleeping)     Just noted this at recent visit on Wed., 06/25/17  3. LAST NORMAL: "When was the last time you were normal (no symptoms)?"     2 mos. ago 4. PATTERN "Does this come and go, or has it been constant since it started?"  "Is it present now?"     Above symptoms are present now 5. CARDIAC SYMPTOMS: "Have you had any of the following symptoms: chest pain, difficulty breathing, palpitations?"    Per sister- has hardening of the arteries 6. NEUROLOGIC SYMPTOMS: "Have you had any of the following symptoms: headache, dizziness, vision loss, double vision, changes in speech, unsteady on your feet?"     Has dementia - repeats everything; speech is very slow, voice weak 7. OTHER SYMPTOMS: "Do you have any other symptoms?"     Pt. doesn't complain of any specific symptoms  Answer Assessment - Initial Assessment Questions 1. DESCRIPTION: "Describe how you are feeling."     Sister voiced  concern for weakness, voice weak, slow speech, and very sleepy a lot of the time 2. SEVERITY: "How bad is it?"  "Can you stand and walk?"   - MILD - Feels weak or tired, but does not interfere with work, school or normal activities   - [place] to stand and walk; weakness interferes with work, school, or normal activities   - SEVERE - Unable to stand or walk     No asked 3. ONSET:  "When did the weakness begin?"    Noted on 5/29, after not seeing him for 2 mos.  4. CAUSE: "What do you think is causing the weakness?"    unknown 5. MEDICINES: "Have you recently started a new medicine or had a change in the amount of a medicine?"    Is out of medication because  hasn't followed up for over a year. 6. OTHER SYMPTOMS: "Do you have any other symptoms?" (e.g., chest pain, fever, cough, SOB, vomiting, diarrhea, bleeding)     Pt. Doesn't offer complaints; has lost a lot of weight; approx. 25 lbs.  Protocols used: WEAKNESS (GENERALIZED) AND FATIGUE-A-AH, NEUROLOGIC DEFICIT-A-AH

## 2017-06-29 ENCOUNTER — Encounter (HOSPITAL_COMMUNITY): Payer: Self-pay | Admitting: *Deleted

## 2017-06-29 ENCOUNTER — Emergency Department (HOSPITAL_COMMUNITY): Payer: Medicare HMO

## 2017-06-29 ENCOUNTER — Emergency Department (HOSPITAL_COMMUNITY)
Admission: EM | Admit: 2017-06-29 | Discharge: 2017-06-29 | Disposition: A | Discharge status: Home / Self Care | Payer: Medicare HMO | Attending: Emergency Medicine | Admitting: Emergency Medicine

## 2017-06-29 DIAGNOSIS — Z79899 Other long term (current) drug therapy: Secondary | ICD-10-CM | POA: Diagnosis not present

## 2017-06-29 DIAGNOSIS — I2510 Atherosclerotic heart disease of native coronary artery without angina pectoris: Secondary | ICD-10-CM | POA: Diagnosis not present

## 2017-06-29 DIAGNOSIS — J449 Chronic obstructive pulmonary disease, unspecified: Secondary | ICD-10-CM | POA: Diagnosis not present

## 2017-06-29 DIAGNOSIS — Z87891 Personal history of nicotine dependence: Secondary | ICD-10-CM | POA: Insufficient documentation

## 2017-06-29 DIAGNOSIS — E785 Hyperlipidemia, unspecified: Secondary | ICD-10-CM

## 2017-06-29 DIAGNOSIS — IMO0002 Reserved for concepts with insufficient information to code with codable children: Secondary | ICD-10-CM

## 2017-06-29 DIAGNOSIS — Z85820 Personal history of malignant melanoma of skin: Secondary | ICD-10-CM | POA: Insufficient documentation

## 2017-06-29 DIAGNOSIS — E1165 Type 2 diabetes mellitus with hyperglycemia: Secondary | ICD-10-CM

## 2017-06-29 DIAGNOSIS — E1149 Type 2 diabetes mellitus with other diabetic neurological complication: Secondary | ICD-10-CM

## 2017-06-29 DIAGNOSIS — R4781 Slurred speech: Secondary | ICD-10-CM | POA: Diagnosis not present

## 2017-06-29 DIAGNOSIS — F5101 Primary insomnia: Secondary | ICD-10-CM

## 2017-06-29 DIAGNOSIS — E119 Type 2 diabetes mellitus without complications: Secondary | ICD-10-CM | POA: Insufficient documentation

## 2017-06-29 DIAGNOSIS — Z8673 Personal history of transient ischemic attack (TIA), and cerebral infarction without residual deficits: Secondary | ICD-10-CM | POA: Insufficient documentation

## 2017-06-29 DIAGNOSIS — R410 Disorientation, unspecified: Secondary | ICD-10-CM

## 2017-06-29 DIAGNOSIS — R000 Tachycardia, unspecified: Secondary | ICD-10-CM | POA: Diagnosis not present

## 2017-06-29 DIAGNOSIS — R4020 Unspecified coma: Secondary | ICD-10-CM | POA: Diagnosis not present

## 2017-06-29 DIAGNOSIS — I10 Essential (primary) hypertension: Secondary | ICD-10-CM | POA: Diagnosis not present

## 2017-06-29 DIAGNOSIS — I739 Peripheral vascular disease, unspecified: Secondary | ICD-10-CM

## 2017-06-29 DIAGNOSIS — F418 Other specified anxiety disorders: Secondary | ICD-10-CM

## 2017-06-29 DIAGNOSIS — R69 Illness, unspecified: Secondary | ICD-10-CM | POA: Diagnosis not present

## 2017-06-29 DIAGNOSIS — Z794 Long term (current) use of insulin: Secondary | ICD-10-CM | POA: Insufficient documentation

## 2017-06-29 DIAGNOSIS — R4182 Altered mental status, unspecified: Secondary | ICD-10-CM | POA: Diagnosis present

## 2017-06-29 LAB — CBC
HCT: 41.2 % (ref 39.0–52.0)
HEMOGLOBIN: 14.4 g/dL (ref 13.0–17.0)
MCH: 30.3 pg (ref 26.0–34.0)
MCHC: 35 g/dL (ref 30.0–36.0)
MCV: 86.6 fL (ref 78.0–100.0)
PLATELETS: 239 10*3/uL (ref 150–400)
RBC: 4.76 MIL/uL (ref 4.22–5.81)
RDW: 13.3 % (ref 11.5–15.5)
WBC: 7.2 10*3/uL (ref 4.0–10.5)

## 2017-06-29 LAB — COMPREHENSIVE METABOLIC PANEL
ALK PHOS: 55 U/L (ref 38–126)
ALT: 18 U/L (ref 17–63)
ANION GAP: 11 (ref 5–15)
AST: 21 U/L (ref 15–41)
Albumin: 3.6 g/dL (ref 3.5–5.0)
BUN: 14 mg/dL (ref 6–20)
CALCIUM: 8.8 mg/dL — AB (ref 8.9–10.3)
CO2: 20 mmol/L — ABNORMAL LOW (ref 22–32)
Chloride: 106 mmol/L (ref 101–111)
Creatinine, Ser: 1.09 mg/dL (ref 0.61–1.24)
Glucose, Bld: 141 mg/dL — ABNORMAL HIGH (ref 65–99)
Potassium: 3.5 mmol/L (ref 3.5–5.1)
Sodium: 137 mmol/L (ref 135–145)
Total Bilirubin: 1 mg/dL (ref 0.3–1.2)
Total Protein: 7 g/dL (ref 6.5–8.1)

## 2017-06-29 LAB — CBG MONITORING, ED: GLUCOSE-CAPILLARY: 134 mg/dL — AB (ref 65–99)

## 2017-06-29 MED ORDER — ASPIRIN 81 MG PO TBEC: 81.0000 mg | DELAYED_RELEASE_TABLET | Freq: Every day | ORAL | 0 refills | Status: DC

## 2017-06-29 MED ORDER — SERTRALINE HCL 50 MG PO TABS: 50.0000 mg | ORAL_TABLET | Freq: Every day | ORAL | 0 refills | Status: AC

## 2017-06-29 MED ORDER — LOSARTAN POTASSIUM 25 MG PO TABS: 25.0000 mg | ORAL_TABLET | Freq: Every day | ORAL | 0 refills | Status: AC

## 2017-06-29 MED ORDER — ROSUVASTATIN CALCIUM 20 MG PO TABS: 20.0000 mg | ORAL_TABLET | Freq: Every day | ORAL | 0 refills | Status: DC

## 2017-06-29 MED ORDER — METOPROLOL SUCCINATE ER 50 MG PO TB24: 50.0000 mg | ORAL_TABLET | Freq: Every day | ORAL | 0 refills | Status: AC

## 2017-06-29 NOTE — ED Provider Notes (Signed)
[facility] DEPT Provider Note   CSN: [id number] Arrival date & time: 06/29/17  1248     History   Chief Complaint Chief Complaint  Patient presents with  . Altered Mental Status    HPI Alfred Armstrong is a 84 y.o. male.  HPI Presents with 2 family members to provide the HPI. Patient has dementia. However, the patient is awake and alert, answers questions about his current state directly, specifically, but has little insight into his longer-term condition, limiting the HPI, and review of systems. Level 5 caveat secondary to dementia   Family notes that over the past 2 months patient has lost substantial weight, has not been taking his medication as directed, and in particular over the past week has had increasing confusion, with inconsistent speech patterns. No noted obvious weakness, fall, fever, cough, and the patient denies these as well. No clear precipitant, no clear alleviating or exacerbating factors.  Past Medical History:  Diagnosis Date  . Anxiety    related to hosp. enviromental   . CAD (coronary artery disease)    Dr. Aundra Dubin  . Cancer ([facility])    skin  . Carotid stenosis    Followed by Dr Amedeo Plenty - Angiogram 5/10 showed left common carotid to be totally occluded and 50% RICA stenosis  . COPD (chronic obstructive pulmonary disease) ([facility])   . CVA (cerebral infarction)   . Diabetes mellitus without complication ([facility])   . GERD (gastroesophageal reflux disease)   . H/O hiatal hernia   . Headache(784.0)    annoyance related to enviromental allergies   . History of nephrolithiasis   . HTN (hypertension)    [facility] Cough  . Hyperlipidemia   . MGUS (monoclonal gammopathy of unknown significance) 06/17/2011  . Neuromuscular disorder ([facility])    neuropathy  . Osteoarthrosis, unspecified whether generalized or localized, unspecified site   . Shingles   . Shortness of breath    uses symbicort daily, when taking the garbage   . Stroke ([facility])     . Tubulovillous adenoma polyp of colon    w/HGD 1999    Patient Active Problem List   Diagnosis Date Noted  . Mild cognitive impairment 06/04/2016  . Hypokalemia 04/22/2016  . Depression with anxiety 04/22/2016  . Senile dementia without behavioral disturbance 04/22/2016  . Primary insomnia 04/10/2016  . Routine general medical examination at a health care facility 10/02/2015  . Occlusion and stenosis of carotid artery without mention of cerebral infarction 01/06/2013  . MGUS (monoclonal gammopathy of unknown significance) 06/17/2011  . PHN (postherpetic neuralgia) 05/09/2011  . Kidney stone on left side 04/10/2011  . CAD (coronary artery disease) 02/05/2011  . Type II diabetes mellitus with manifestations ([facility]) 02/01/2011  . Peripheral neuropathy 07/18/2010  . Vitamin D deficient osteomalacia 07/18/2010  . HYPERTENSION, BENIGN ESSENTIAL 07/05/2009  . Hyperlipidemia LDL goal <70 06/06/2009  . COPD (chronic obstructive pulmonary disease) with chronic bronchitis ([facility]) 09/06/2008  . GERD 09/06/2008  . OSTEOARTHRITIS 09/06/2008    Past Surgical History:  Procedure Laterality Date  . Carotid artery angiogram  04/2008  . CAROTID ENDARTERECTOMY    . CORONARY ANGIOPLASTY  04/2008  . CORONARY ARTERY BYPASS GRAFT  1998   [facility] in [place]   . ENDARTERECTOMY Right 06/18/2012   Procedure: ENDARTERECTOMY CAROTID;  Surgeon: Angelia Mould, MD;  Location: [facility];  Service: Vascular;  Laterality: Right;  with resection of redundant Right Internal Carotid Artery  . PATCH ANGIOPLASTY Right 06/18/2012   Procedure:  PATCH ANGIOPLASTY;  Surgeon: Angelia Mould, MD;  Location: [facility] OR;  Service: Vascular;  Laterality: Right;  using Hemashield Platinum Finesse Patch  . TONSILLECTOMY  1941        Home Medications    Prior to Admission medications   Medication Sig Start Date End Date Taking? Authorizing Provider  aspirin 81 MG EC tablet Take 1 tablet (81 mg total)  by mouth daily. Patient not taking: Reported on 06/29/2017 04/22/16   Janith Lima, MD  cholecalciferol (VITAMIN D) 1000 UNITS tablet Take 1,000 Units by mouth 2 (two) times daily.    [provider]  losartan (COZAAR) 25 MG tablet TAKE 1 TABLET BY MOUTH DAILY Patient not taking: Reported on 06/29/2017 11/22/15   Janith Lima, MD  metoprolol succinate (TOPROL-XL) 50 MG 24 hr tablet TAKE 1 TABLET (50 MG TOTAL) BY MOUTH DAILY. TAKE WITH OR IMMEDIATELY FOLLOWING A MEAL. Patient not taking: Reported on 06/29/2017 01/07/16   Janith Lima, MD  rosuvastatin (CRESTOR) 20 MG tablet TAKE 1 TABLET (20 MG TOTAL) BY MOUTH DAILY. Patient not taking: Reported on 06/29/2017 11/26/15   Janith Lima, MD  sertraline (ZOLOFT) 50 MG tablet Take 1 tablet (50 mg total) by mouth daily. Patient not taking: Reported on 06/29/2017 04/22/16   Janith Lima, MD  zolpidem (AMBIEN) 10 MG tablet Take 1 tablet (10 mg total) by mouth at bedtime. Patient not taking: Reported on 06/29/2017 04/10/16   Janith Lima, MD  Hypertonic Nasal Wash (SINUS RINSE BOTTLE KIT NA) by Nasal route as directed.    04/10/11  [provider]    Family History Family History  Problem Relation Age of Onset  . Alcohol abuse Mother   . Alcohol abuse Father   . Colon cancer Neg Hx     Social History Social History   Tobacco Use  . Smoking status: Former Smoker    Types: Cigarettes    Last attempt to quit: 01/28/1993    Years since quitting: 24.4  . Smokeless tobacco: Never Used  Substance Use Topics  . Alcohol use: No    Comment: stopped drinking many yrs. ago   . Drug use: No     Allergies   Tiotropium bromide monohydrate and Daliresp [roflumilast]   Review of Systems Review of Systems  Unable to perform ROS: Dementia     Physical Exam Updated Vital Signs BP (!) 151/86   Pulse 98   Temp 98.1 F (36.7 C) (Oral)   Resp 12   SpO2 97%   Physical Exam  Constitutional: He appears well-developed. No  distress.  HENT:  Head: Normocephalic and atraumatic.  Eyes: Conjunctivae and EOM are normal.  Cardiovascular: Normal rate and regular rhythm.  Pulmonary/Chest: Effort normal. No stridor. No respiratory distress.  Abdominal: He exhibits no distension.  Musculoskeletal: He exhibits no edema.  Neurological: He is alert.  Skin: Skin is warm and dry.  Psychiatric: His affect is labile. Cognition and memory are impaired.  Nursing note and vitals reviewed.    ED Treatments / Results  Labs (all labs ordered are listed, but only abnormal results are displayed) Labs Reviewed  COMPREHENSIVE METABOLIC PANEL - Abnormal; Notable for the following components:      Result Value   CO2 20 (*)    Glucose, Bld 141 (*)    Calcium 8.8 (*)    All other components within normal limits  CBG MONITORING, ED - Abnormal; Notable for the following components:   Glucose-Capillary 134 (*)  All other components within normal limits  CBC  URINALYSIS, ROUTINE W REFLEX MICROSCOPIC    EKG EKG Interpretation  Date/Time:  Sunday June 29 2017 13:00:42 EDT Ventricular Rate:  102 PR Interval:    QRS Duration: 111 QT Interval:  345 QTC Calculation: 450 R Axis:   23 Text Interpretation:  Sinus tachycardia Multiple premature complexes, vent & supraven Probable LVH with secondary repol abnrm Since last tracing rate faster Confirmed by Daleen Bo [phone]) on 06/29/2017 1:14:36 PM   Radiology Ct Head Wo Contrast  Result Date: 06/29/2017 CLINICAL DATA:  Altered level of consciousness. Slurred speech. History of dementia. EXAM: CT HEAD WITHOUT CONTRAST TECHNIQUE: Contiguous axial images were obtained from the base of the skull through the vertex without intravenous contrast. COMPARISON:  Head CT and MRI 04/13/2016 FINDINGS: Brain: There is no evidence of acute infarct, intracranial hemorrhage, mass, midline shift, or extra-axial fluid collection. A chronic left MCA infarct is again seen involving the frontal lobe.  Scattered subcortical and periventricular white matter hypodensities are similar to the prior CT and nonspecific but compatible with mild chronic small vessel ischemic disease. There is mild age related cerebral atrophy. Vascular: Calcified atherosclerosis at the skull base. No hyperdense vessel. Skull: No fracture focal osseous lesion. Sinuses/Orbits: Visualized paranasal sinuses and mastoid air cells are clear. Orbits are unremarkable. Other: None. IMPRESSION: 1. No evidence of acute intracranial abnormality. 2. Chronic left MCA infarct. Electronically Signed   By: Logan Bores M.D.   On: 06/29/2017 14:26    Procedures Procedures (including critical care time)  Medications Ordered in ED Medications - No data to display   Initial Impression / Assessment and Plan / ED Course  I have reviewed the triage vital signs and the nursing notes.  Pertinent labs & imaging results that were available during my care of the patient were reviewed by me and considered in my medical decision making (see chart for details).    3:49 PM Patient in no distress per I discussed all findings with the patient, and his family members. Discussed reassuring labs, CT evidence of prior stroke, which he has had prior demonstration of on imaging in the past as well. Patient continues to move all tremors spontaneously, he is in no distress, is afebrile, hemodynamically unremarkable. We discussed possibilities for his change in condition, including progression of disease, medication noncompliance, or occult pathology. With no evidence for acute new issues, I discussed patient's case with social work, and they have discussed the patient's status with family, and provided additional resources. With no indication for admission, the patient was discharged home, with anticipated additional home health services, consideration of outpatient placement.   Final Clinical Impressions(s) / ED Diagnoses  Confusion   Carmin Muskrat, MD 06/29/17 1551

## 2017-06-29 NOTE — Discharge Instructions (Signed)
As discussed, it is important to take all medication as directed and be sure to follow-up with your physician.

## 2017-06-29 NOTE — ED Triage Notes (Signed)
Pt's cousin states the pt is needing more encouragement to eat, difficulty expressing himself, slurred speech over the past week. Pt's cousin states he is concerned the pt was normal last week and is concerned the pt may have had a stroke 6 days ago. Pt was diagnosed with dementia last year.

## 2017-06-29 NOTE — Clinical Social Work Note (Addendum)
CSW received consult for "SW on call." EDP stated patient's family wanting to know options for alternative placement or in home assistance options for patient, as Dementia is progressing. CSW met with patient, cousin-Tim, and sister-Marie at bedside. Family concerned about patient's decline in ability/motivation to perform IADLs and some ADLs. Family and patient unsure as to placement vs. In home assistance. CSW discussed and provided resources for the long term care placement process, Dementia/Memory Care ALF/SNF listing for [place] counties, and PACE information and referral. Family very supportive and involved. CSW also addressed questions about private duty nursing options. Family and patient appreciative of CSW efforts. CSW updated EDP. CSW signing off as no further Social Work needs identified. Please reconsult if new Social Work needs arise.   Oretha Ellis, [place], [place] Weekend Coverage-ED Social Worker [phone]

## 2017-06-30 NOTE — Telephone Encounter (Signed)
Patient is scheduled 6/10 at 10:30am

## 2017-07-07 ENCOUNTER — Other Ambulatory Visit: Payer: Self-pay

## 2017-07-07 ENCOUNTER — Other Ambulatory Visit: Payer: Self-pay | Admitting: *Deleted

## 2017-07-07 ENCOUNTER — Other Ambulatory Visit (INDEPENDENT_AMBULATORY_CARE_PROVIDER_SITE_OTHER): Payer: Medicare HMO

## 2017-07-07 ENCOUNTER — Encounter: Payer: Self-pay | Admitting: *Deleted

## 2017-07-07 ENCOUNTER — Encounter: Payer: Self-pay | Admitting: Internal Medicine

## 2017-07-07 ENCOUNTER — Ambulatory Visit (INDEPENDENT_AMBULATORY_CARE_PROVIDER_SITE_OTHER): Payer: Medicare HMO | Admitting: Internal Medicine

## 2017-07-07 VITALS — BP 130/70 | HR 98 | Temp 98.5°F | Resp 16 | Ht 72.0 in | Wt 189.5 lb

## 2017-07-07 DIAGNOSIS — Z0000 Encounter for general adult medical examination without abnormal findings: Secondary | ICD-10-CM

## 2017-07-07 DIAGNOSIS — F0390 Unspecified dementia without behavioral disturbance: Secondary | ICD-10-CM | POA: Diagnosis not present

## 2017-07-07 DIAGNOSIS — D472 Monoclonal gammopathy: Secondary | ICD-10-CM | POA: Diagnosis not present

## 2017-07-07 DIAGNOSIS — J449 Chronic obstructive pulmonary disease, unspecified: Secondary | ICD-10-CM | POA: Diagnosis not present

## 2017-07-07 DIAGNOSIS — E785 Hyperlipidemia, unspecified: Secondary | ICD-10-CM

## 2017-07-07 DIAGNOSIS — I10 Essential (primary) hypertension: Secondary | ICD-10-CM

## 2017-07-07 DIAGNOSIS — E538 Deficiency of other specified B group vitamins: Secondary | ICD-10-CM

## 2017-07-07 DIAGNOSIS — Z111 Encounter for screening for respiratory tuberculosis: Secondary | ICD-10-CM

## 2017-07-07 DIAGNOSIS — I2510 Atherosclerotic heart disease of native coronary artery without angina pectoris: Secondary | ICD-10-CM

## 2017-07-07 DIAGNOSIS — G6289 Other specified polyneuropathies: Secondary | ICD-10-CM

## 2017-07-07 DIAGNOSIS — R69 Illness, unspecified: Secondary | ICD-10-CM | POA: Diagnosis not present

## 2017-07-07 DIAGNOSIS — M839 Adult osteomalacia, unspecified: Secondary | ICD-10-CM | POA: Diagnosis not present

## 2017-07-07 DIAGNOSIS — E118 Type 2 diabetes mellitus with unspecified complications: Secondary | ICD-10-CM | POA: Diagnosis not present

## 2017-07-07 DIAGNOSIS — J4489 Other specified chronic obstructive pulmonary disease: Secondary | ICD-10-CM

## 2017-07-07 DIAGNOSIS — I739 Peripheral vascular disease, unspecified: Secondary | ICD-10-CM

## 2017-07-07 DIAGNOSIS — E1165 Type 2 diabetes mellitus with hyperglycemia: Secondary | ICD-10-CM

## 2017-07-07 DIAGNOSIS — IMO0002 Reserved for concepts with insufficient information to code with codable children: Secondary | ICD-10-CM

## 2017-07-07 DIAGNOSIS — E1149 Type 2 diabetes mellitus with other diabetic neurological complication: Secondary | ICD-10-CM

## 2017-07-07 LAB — COMPREHENSIVE METABOLIC PANEL
ALK PHOS: 65 U/L (ref 39–117)
ALT: 9 U/L (ref 0–53)
AST: 11 U/L (ref 0–37)
Albumin: 3.5 g/dL (ref 3.5–5.2)
BILIRUBIN TOTAL: 0.9 mg/dL (ref 0.2–1.2)
BUN: 11 mg/dL (ref 6–23)
CO2: 24 mEq/L (ref 19–32)
CREATININE: 0.98 mg/dL (ref 0.40–1.50)
Calcium: 9 mg/dL (ref 8.4–10.5)
Chloride: 107 mEq/L (ref 96–112)
GFR: 77.52 mL/min (ref >60.00)
GLUCOSE: 132 mg/dL — AB (ref 70–99)
Potassium: 3.6 mEq/L (ref 3.5–5.1)
Sodium: 141 mEq/L (ref 135–145)
TOTAL PROTEIN: 6.8 g/dL (ref 6.0–8.3)

## 2017-07-07 LAB — CBC WITH DIFFERENTIAL/PLATELET
BASOS ABS: 0.1 10*3/uL (ref 0.0–0.1)
Basophils Relative: 1 % (ref 0.0–3.0)
EOS PCT: 1.1 % (ref 0.0–5.0)
Eosinophils Absolute: 0.1 10*3/uL (ref 0.0–0.7)
HCT: 39.4 % (ref 39.0–52.0)
Hemoglobin: 13.5 g/dL (ref 13.0–17.0)
LYMPHS ABS: 0.9 10*3/uL (ref 0.7–4.0)
Lymphocytes Relative: 13.7 % (ref 12.0–46.0)
MCHC: 34.3 g/dL (ref 30.0–36.0)
MCV: 88.7 fl (ref 78.0–100.0)
MONO ABS: 0.4 10*3/uL (ref 0.1–1.0)
MONOS PCT: 6.3 % (ref 3.0–12.0)
NEUTROS ABS: 5.4 10*3/uL (ref 1.4–7.7)
NEUTROS PCT: 77.9 % — AB (ref 43.0–77.0)
PLATELETS: 216 10*3/uL (ref 150.0–400.0)
RBC: 4.44 Mil/uL (ref 4.22–5.81)
RDW: 14.4 % (ref 11.5–15.5)
WBC: 6.9 10*3/uL (ref 4.0–10.5)

## 2017-07-07 LAB — URINALYSIS, ROUTINE W REFLEX MICROSCOPIC
Leukocytes, UA: NEGATIVE
Nitrite: NEGATIVE
Specific Gravity, Urine: >=1.03 — AB (ref 1.000–1.030)
TOTAL PROTEIN, URINE-UPE24: 100 — AB
Urine Glucose: NEGATIVE
Urobilinogen, UA: 1 (ref 0.0–1.0)
pH: 5.5 (ref 5.0–8.0)

## 2017-07-07 LAB — FOLATE: FOLATE: 4.7 ng/mL — AB (ref >5.9)

## 2017-07-07 LAB — MICROALBUMIN / CREATININE URINE RATIO
CREATININE, U: 231.4 mg/dL
Microalb Creat Ratio: 15.9 mg/g (ref 0.0–30.0)
Microalb, Ur: 36.8 mg/dL — ABNORMAL HIGH (ref 0.0–1.9)

## 2017-07-07 LAB — VITAMIN D 25 HYDROXY (VIT D DEFICIENCY, FRACTURES): VITD: 11.22 ng/mL — ABNORMAL LOW (ref 30.00–100.00)

## 2017-07-07 LAB — LIPID PANEL
CHOL/HDL RATIO: 5
Cholesterol: 176 mg/dL (ref 0–200)
HDL: 38 mg/dL — AB (ref >39.00)
LDL Cholesterol: 117 mg/dL — ABNORMAL HIGH (ref 0–99)
NONHDL: 137.74
Triglycerides: 102 mg/dL (ref 0.0–149.0)
VLDL: 20.4 mg/dL (ref 0.0–40.0)

## 2017-07-07 LAB — TSH: TSH: 0.88 u[IU]/mL (ref 0.35–4.50)

## 2017-07-07 LAB — HEMOGLOBIN A1C: HEMOGLOBIN A1C: 6.4 % (ref 4.6–6.5)

## 2017-07-07 LAB — VITAMIN B12: VITAMIN B 12: 320 pg/mL (ref 211–911)

## 2017-07-07 MED ORDER — CHOLECALCIFEROL 1.25 MG (50000 UT) PO CAPS
50000.0000 [IU] | ORAL_CAPSULE | ORAL | 1 refills | Status: AC
Start: 2017-07-07 — End: ?

## 2017-07-07 MED ORDER — FOLIC ACID 1 MG PO TABS: 1.0000 mg | ORAL_TABLET | Freq: Every day | ORAL | 1 refills | Status: AC

## 2017-07-07 NOTE — Progress Notes (Signed)
Subjective:  Patient ID: Alfred Armstrong, male    DOB: [birth year]  Age: 84 y.o. MRN: [id number]  CC: Hypertension; Diabetes; and Annual Exam   HPI Alfred Armstrong presents for f/up and CPX- he is with his sister today and she is concerned about him.  He is not eating very well and does not keep food in his house.  He is not managing his finances well.  He is not taking his medications.  He lives alone and has very minimal social support.  His sister has noticed that he has significant difficulty remembering things and finding his words.  She thinks that he would be better off if he was in an assisted living or memory care unit.   Past Medical History:  Diagnosis Date  . Anxiety    related to hosp. enviromental   . CAD (coronary artery disease)    Dr. Aundra Dubin  . Cancer ([place])    skin  . Carotid stenosis    Followed by Dr Amedeo Plenty - Angiogram 5/10 showed left common carotid to be totally occluded and 50% RICA stenosis  . COPD (chronic obstructive pulmonary disease) ([place])   . CVA (cerebral infarction)   . Diabetes mellitus without complication ([place])   . GERD (gastroesophageal reflux disease)   . H/O hiatal hernia   . Headache(784.0)    annoyance related to enviromental allergies   . History of nephrolithiasis   . HTN (hypertension)    [facility] Cough  . Hyperlipidemia   . MGUS (monoclonal gammopathy of unknown significance) 06/17/2011  . Neuromuscular disorder (HCC)    neuropathy  . Osteoarthrosis, unspecified whether generalized or localized, unspecified site   . Shingles   . Shortness of breath    uses symbicort daily, when taking the garbage   . Stroke ([place])   . Tubulovillous adenoma polyp of colon    w/HGD 1999   Past Surgical History:  Procedure Laterality Date  . Carotid artery angiogram  04/2008  . CAROTID ENDARTERECTOMY    . CORONARY ANGIOPLASTY  04/2008  . CORONARY ARTERY BYPASS GRAFT  1998   [facility] [place]   . ENDARTERECTOMY Right 06/18/2012   Procedure: ENDARTERECTOMY CAROTID;  Surgeon: Angelia Mould, MD;  Location: [place];  Service: Vascular;  Laterality: Right;  with resection of redundant Right Internal Carotid Artery  . PATCH ANGIOPLASTY Right 06/18/2012   Procedure: PATCH ANGIOPLASTY;  Surgeon: Angelia Mould, MD;  Location: [facility] OR;  Service: Vascular;  Laterality: Right;  using Hemashield Platinum Finesse Patch  . TONSILLECTOMY  1941    reports that he quit smoking about 24 years ago. His smoking use included cigarettes. He has never used smokeless tobacco. He reports that he does not drink alcohol or use drugs. family history includes Alcohol abuse in his father and mother. Allergies  Allergen Reactions  . Tiotropium Bromide Monohydrate     Dry mouth  . Daliresp [Roflumilast]     Bad thoughts/dreams    Outpatient Medications Prior to Visit  Medication Sig Dispense Refill  . losartan (COZAAR) 25 MG tablet Take 1 tablet (25 mg total) by mouth daily. (Patient not taking: Reported on 07/07/2017) 30 tablet 0  . metoprolol succinate (TOPROL-XL) 50 MG 24 hr tablet Take 1 tablet (50 mg total) by mouth daily. Take with or immediately following a meal. (Patient not taking: Reported on 07/07/2017) 30 tablet 0  . sertraline (ZOLOFT) 50 MG tablet Take 1 tablet (50 mg total) by mouth daily. (Patient not  taking: Reported on 07/07/2017) 30 tablet 0  . aspirin 81 MG EC tablet Take 1 tablet (81 mg total) by mouth daily. (Patient not taking: Reported on 07/07/2017) 30 tablet 0  . cholecalciferol (VITAMIN D) 1000 UNITS tablet Take 1,000 Units by mouth 2 (two) times daily.    . rosuvastatin (CRESTOR) 20 MG tablet Take 1 tablet (20 mg total) by mouth daily. (Patient not taking: Reported on 07/07/2017) 30 tablet 0  . zolpidem (AMBIEN) 10 MG tablet Take 1 tablet (10 mg total) by mouth at bedtime. (Patient not taking: Reported on 06/29/2017) 30 tablet 5   No facility-administered medications prior to visit.     ROS Review of Systems    Constitutional: Positive for unexpected weight change (wt loss). Negative for activity change, appetite change, diaphoresis and fatigue.  HENT: Negative.   Eyes: Negative for visual disturbance.  Respiratory: Negative for cough, chest tightness, shortness of breath and wheezing.   Cardiovascular: Negative for chest pain, palpitations and leg swelling.  Gastrointestinal: Negative for abdominal pain, constipation, diarrhea and nausea.  Endocrine: Negative.   Genitourinary: Negative.  Negative for difficulty urinating.  Musculoskeletal: Negative.  Negative for arthralgias, back pain and neck pain.  Skin: Negative.   Neurological: Negative.  Negative for dizziness, weakness and light-headedness.  Hematological: Negative for adenopathy. Does not bruise/bleed easily.  Psychiatric/Behavioral: Positive for confusion and decreased concentration. Negative for agitation, behavioral problems, dysphoric mood, hallucinations, self-injury, sleep disturbance and suicidal ideas. The patient is nervous/anxious. The patient is not hyperactive.     Objective:  BP 130/70 (BP Location: Left Arm, Patient Position: Sitting, Cuff Size: Normal)   Pulse 98   Temp 98.5 F (36.9 C) (Oral)   Resp 16   Ht 6' (1.829 m)   Wt 189 lb 8 oz (86 kg)   SpO2 97%   BMI 25.70 kg/m   BP Readings from Last 3 Encounters:  07/07/17 130/70  06/29/17 129/72  08/12/16 120/70    Wt Readings from Last 3 Encounters:  07/07/17 189 lb 8 oz (86 kg)  08/12/16 203 lb 12 oz (92.4 kg)  05/27/16 210 lb (95.3 kg)    Physical Exam  Constitutional: He is oriented to person, place, and time. No distress.  HENT:  Mouth/Throat: Oropharynx is clear and moist. No oropharyngeal exudate.  Eyes: Conjunctivae are normal. No scleral icterus.  Neck: Normal range of motion. Neck supple. No JVD present. No thyromegaly present.  Cardiovascular: Normal rate, regular rhythm and normal heart sounds. Exam reveals no gallop.  No murmur  heard. Pulmonary/Chest: Effort normal and breath sounds normal. No respiratory distress. He has no wheezes. He has no rales.  Abdominal: Soft. Bowel sounds are normal. He exhibits no mass. There is no hepatosplenomegaly. There is no tenderness.  Musculoskeletal: Normal range of motion. He exhibits no edema, tenderness or deformity.  Lymphadenopathy:    He has no cervical adenopathy.  Neurological: He is alert and oriented to person, place, and time.  Skin: Skin is warm and dry. He is not diaphoretic.  Psychiatric: Judgment and thought content normal. His mood appears anxious. His affect is not inappropriate. His speech is delayed and tangential. His speech is not rapid and/or pressured and not slurred. He is slowed and withdrawn. He is not agitated, not aggressive, not hyperactive and not combative. Thought content is not paranoid. Cognition and memory are impaired. He does not express impulsivity or inappropriate judgment. He does not exhibit a depressed mood. He expresses no homicidal and no suicidal ideation. He  is communicative. He exhibits abnormal recent memory and abnormal remote memory.  Vitals reviewed.   Lab Results  Component Value Date   WBC 6.9 07/07/2017   HGB 13.5 07/07/2017   HCT 39.4 07/07/2017   PLT 216.0 07/07/2017   GLUCOSE 132 (H) 07/07/2017   CHOL 176 07/07/2017   TRIG 102.0 07/07/2017   HDL 38.00 (L) 07/07/2017   LDLCALC 117 (H) 07/07/2017   ALT 9 07/07/2017   AST 11 07/07/2017   NA 141 07/07/2017   K 3.6 07/07/2017   CL 107 07/07/2017   CREATININE 0.98 07/07/2017   BUN 11 07/07/2017   CO2 24 07/07/2017   TSH 0.88 07/07/2017   PSA 3.05 12/14/2010   INR 1.01 04/13/2016   HGBA1C 6.4 07/07/2017   MICROALBUR 36.8 (H) 07/07/2017    Ct Head Wo Contrast  Result Date: 06/29/2017 CLINICAL DATA:  Altered level of consciousness. Slurred speech. History of dementia. EXAM: CT HEAD WITHOUT CONTRAST TECHNIQUE: Contiguous axial images were obtained from the base of the  skull through the vertex without intravenous contrast. COMPARISON:  Head CT and MRI 04/13/2016 FINDINGS: Brain: There is no evidence of acute infarct, intracranial hemorrhage, mass, midline shift, or extra-axial fluid collection. A chronic left MCA infarct is again seen involving the frontal lobe. Scattered subcortical and periventricular white matter hypodensities are similar to the prior CT and nonspecific but compatible with mild chronic small vessel ischemic disease. There is mild age related cerebral atrophy. Vascular: Calcified atherosclerosis at the skull base. No hyperdense vessel. Skull: No fracture focal osseous lesion. Sinuses/Orbits: Visualized paranasal sinuses and mastoid air cells are clear. Orbits are unremarkable. Other: None. IMPRESSION: 1. No evidence of acute intracranial abnormality. 2. Chronic left MCA infarct. Electronically Signed   By: Logan Bores M.D.   On: 06/29/2017 14:26    Assessment & Plan:   Jahbari was seen today for hypertension and diabetes.  Diagnoses and all orders for this visit:  Senile dementia without behavioral disturbance -     Ambulatory referral to [place] to [place] [facility] -     Folate; Future -     Vitamin B12; Future  HYPERTENSION, BENIGN ESSENTIAL- His blood pressure is adequately well controlled.  Electrolytes and renal function are normal. -     CBC with Differential/Platelet; Future -     Urinalysis, Routine w reflex microscopic; Future -     Comprehensive metabolic panel; Future  Type 2 diabetes mellitus with complication, without long-term current use of insulin ([place])- His blood sugars are adequately well controlled. -     Microalbumin / creatinine urine ratio; Future -     Hemoglobin A1c; Future -     Comprehensive metabolic panel; Future  Other polyneuropathy  Vitamin D deficient osteomalacia -     VITAMIN D 25 Hydroxy (Vit-D Deficiency, Fractures); Future -     Cholecalciferol 50000 units capsule; Take 1 capsule  (50,000 Units total) by mouth once a week.  Hyperlipidemia LDL goal <70- He has not achieved his LDL goal.  He is not compliant with the statin.  I have asked him to restart the statin. -     TSH; Future -     Lipid panel; Future  MGUS (monoclonal gammopathy of unknown significance) - He is not symptomatic with respect to this.  His CBC and total protein are normal.  COPD (chronic obstructive pulmonary disease) with chronic bronchitis (HCC)-he is asymptomatic with respect to this.  Inhaler therapy is not  indicated.  Coronary artery disease involving native coronary artery of native heart without angina pectoris-he said no recent episodes of angina.  Will restart risk factor modification with statin and a baby aspirin a day. -     Lipid panel; Future  Folate deficiency -     folic acid (FOLVITE) 1 MG tablet; Take 1 tablet (1 mg total) by mouth daily.   I have discontinued Jessy Oto. Paszkiewicz's cholecalciferol and zolpidem. I am also having him start on folic acid and Cholecalciferol. Additionally, I am having him maintain his losartan, metoprolol succinate, sertraline, rosuvastatin, and aspirin.  Meds ordered this encounter  Medications  . folic acid (FOLVITE) 1 MG tablet    Sig: Take 1 tablet (1 mg total) by mouth daily.    Dispense:  90 tablet    Refill:  1  . Cholecalciferol 50000 units capsule    Sig: Take 1 capsule (50,000 Units total) by mouth once a week.    Dispense:  12 capsule    Refill:  1  . rosuvastatin (CRESTOR) 20 MG tablet    Sig: Take 1 tablet (20 mg total) by mouth daily.    Dispense:  90 tablet    Refill:  1  . aspirin 81 MG EC tablet    Sig: Take 1 tablet (81 mg total) by mouth daily.    Dispense:  90 tablet    Refill:  1   See AVS for instructions about healthy living and anticipatory guidance.  Follow-up: Return if symptoms worsen or fail to improve.  Scarlette Calico, MD

## 2017-07-07 NOTE — Patient Outreach (Signed)
[name]) Care Management  07/07/2017  Alfred Armstrong 19-Mar-1931 [id number]   Referral Date: 07/07/17 Referral Source: MD Referral Referral Reason: Dementia, needing care resources.     Outreach Attempt #1 Telephone call to patient.  Spoke with sister Alfred Armstrong and patient.  Sister verifies HIPAA.  Discussed reason for the referral.  Patient with recent ER visit due to alerted mental status.  Sister states that patient has some dementia and needing care resources.  She states that patient has not been taking his medication and per MD note patient care personal care has gone down.  Sister visiting for two weeks and wanting to get some care resources for patient before she leaves.     Social: Patient lives alone and sister lives out of town.     Conditions: Sister reports that patient has HTN, diabetes, dementia and some heart problems.  She reports that patient A1c was 6.6 without medications at this time.  She states that his dementia right now is the main concern and getting him some help.   Medications: Patient not taking any medications presently.   Appointments:  Patient saw PCP today.   Advanced Directives: Patient has an advanced directive and living will.  Sister is his [facility] POA.     Consent: Discussed [facility] Care Management Services.  Sister agreeable at this time for social work to help with care resources.     Plan: RN CM will refer to social work for care resources.   RN CM will sign off at this time.     Jone Baseman, RN, MSN [facility] Care Management Care Management Coordinator Direct Line [phone] Toll Free: [phone]  Fax: [phone]

## 2017-07-07 NOTE — Patient Outreach (Signed)
[facility]) Care Management  07/07/2017  Alfred Armstrong [birth year] [id number]   CSW received an incoming call from patient's sister, [name] today, in response to the HIPAA compliant message that CSW left for [name] earlier in the day.  CSW was able to perform the initial phone assessment on patient with [name], as well as assess and assist with social work needs and services.  CSW introduced self, explained role and types of services provided through [place] [facility] ([place] [facility]).  CSW further explained to  [name] that CSW works with patient's Telephonic RNCM, also with [place] Management, Jon Billings. CSW then explained the reason for the call, indicating that Mrs. Dia Sitter thought that patient and [name] would benefit from social work services and resources to assist with all of the following:  Transportation assistance, a referral to [employer] [facility] through [employer] [facility] [place], providing a Building surveyor of home health agencies/private agency sitters and a list of assisted living facilities in [place] .  CSW obtained two HIPAA compliant identifiers from [name], which included patient's name and date of birth.  In talking with [name], she reported that she is really working toward trying to get her brother to come to [facility] to live.  [name] is requesting assistance with obtaining a list of assisted living facilities in [facility], which CSW agreed to mail today.  In addition, [name] is thinking about arranging in-home care services for patient so CSW agreed to mail [name] a list of home health agencies/private agency sitters.  [name] is aware that [employer] does not cover the cost of assisted living facilities, nor does it cover the cost in-home care services.  [name] voiced understanding, indicating that both services will be an out-of-pocket expense for  patient.  [name] plans to try and get patient's affairs in order while she is staying with patient in [place] for the next two weeks.  [name] would like to have services in place before she returns to [facility].  [name] indicated that patient is having difficulty performing certain activities of daily living, such as cooking.  [name] wanted to know if there are any resources available to provide patient with food.  CSW agreed to make a referral for patient to [employer] through [employer] [facility] [place].  CSW explained to [name] the process for referrals, making [name] the main contact to answer intake assessment questions on patient's behalf.  CSW further explained to [name] that there is currently a waiting list for [facility] but that a representative from [employer] [facility] will be contacting her within the next few weeks to perform the phone assessment.    Last, [name] reports that patient is in need of transportation to and from his physician appointments, as patient's driver's license was recently revoked.  CSW agreed to make a referral to [name], Social Work colleague with [facility] [employer], as Mrs. Humble will provide transportation resources to patient and [name].  Mrs. Peggye Ley has been encouraged to contact [name] directly, CSW providing her with the correct contact information.  No additional social work needs have been identified at this time.  CSW agreed to follow-up with [name] on Friday, July 11, 2017 to ensure that she received the packet of resource information mailed to patient's home, as well as to answer any  questions she may have about that information , at that time.  [facility] CM Care Plan Problem One     Most Recent Value  Care Plan Problem One  Inability to prepare meals in the home.  Role Documenting the Problem One  Clinical Social Worker  Care Plan for Problem  One  Active  [facility] CM Short Term Goal #1   Eligibility will be determined for patient with regards to [employer] through [employer] [facility] [place], within the next two weeks.  [facility] CM Short Term Goal #1 Start Date  07/07/17  Interventions for Short Term Goal #1  CSW will make a referral for patient to [employer] through [employer] [facility] [place].    [facility] CM Care Plan Problem Two     Most Recent Value  Care Plan Problem Two  Lack of in-home caregiver services.  Role Documenting the Problem Two  Clinical Social Worker  Care Plan for Problem Two  Active  [facility] CM Short Term Goal #1   Patient and sister will review the list of in-home health agencies and decide on an agency of choice, within the next two weeks.  [facility] CM Short Term Goal #1 Start Date  07/07/17  Interventions for Short Term Goal #2   CSW will mail patient and patient's sister a list of in-home health agencies that provide private agency sitter services.    [facility] CM Care Plan Problem Three     Most Recent Value  Care Plan Problem Three  Inability to perform activities of daily living independently.  Role Documenting the Problem Three  Clinical Social Worker  Care Plan for Problem Three  Active  [facility] Long Term Goal   Patient will receive assisted living placement in an assessed living facility of choice, within the next 45 days.  THN Long Term Goal Start Date  07/07/17  Interventions for Problem Three Long Term Goal  CSW will mail patient and patient's sister a list of assisted living faciities in [facility], per sister's request.  [facility] CM Short Term Goal #1   Patient's sister will contact patient's primary care physician to request completion of an FL-2 Form and administration of a TB Skin Test, within the next two weeks.  [facility] CM Short Term Goal #1 Start Date  07/07/17  Interventions for Short Term Goal #1  CSW will sent an In Basket message to patient's primary care physician requesting that an FL-2 Form be  completed and a TB Skin Test be administered.  THN CM Short Term Goal #2   Patient and patient's sister will decide on a list of assisted living facilities where they would like for CSW to fax patient's FL-2 Form too, within the next two weeks.  [facility] CM Short Term Goal #2 Start Date  07/07/17  Interventions for Short Term Goal #2  CSW will fax patient's FL-2 Form to all assisted living facilities of choice, once FL-2 Form is obtained from patient's primary care physician and a list of facilities of choice have been given to CSW by patient and patient's sister.      [name], BSW, MSW, LCSW  Licensed Education officer, environmental Health System  Mailing [place] [address], [place], [place] [address] Physical Address-[address], [place], [place] [address] Toll Free Main # [phone] Fax # [phone] Cell # [phone]  Office # [phone] [email or web address]

## 2017-07-07 NOTE — Patient Instructions (Signed)

## 2017-07-07 NOTE — Patient Outreach (Signed)
[facility]) Care Management  07/07/2017  NOLON YELLIN 1931/09/16 [id number]   CSW made an initial attempt to try and contact patient's sister, [name] today to perform the initial phone assessment, as well as assess and assist with social work needs and services, without success.  A HIPAA compliant message was left for [name] on [employer].  CSW is currently awaiting a return call.  CSW will make a second outreach attempt within the next 3-4 business days, if a return call is not received from [name] in the meantime.  CSW will also mail an Outreach Letter to patient's home requesting that [name] contact CSW if she and patient are interested in receiving social work services through [place] with Scientist, clinical (histocompatibility and immunogenetics). [name], BSW, MSW, LCSW  Licensed Education officer, environmental Health System  Mailing [place] [address], [place], [place] [address] Physical Address-[address], [place], [place] [address] Toll Free Main # [phone] Fax # [phone] Cell # [phone]  Office # [phone] [email or web address]

## 2017-07-08 ENCOUNTER — Other Ambulatory Visit: Payer: Self-pay

## 2017-07-08 MED ORDER — ASPIRIN 81 MG PO TBEC: 81.0000 mg | DELAYED_RELEASE_TABLET | Freq: Every day | ORAL | 1 refills | Status: AC

## 2017-07-08 MED ORDER — ROSUVASTATIN CALCIUM 20 MG PO TABS: 20.0000 mg | ORAL_TABLET | Freq: Every day | ORAL | 1 refills | Status: AC

## 2017-07-08 NOTE — Patient Outreach (Signed)
[place]) Care Management  07/08/2017  TERUO STILLEY 1931-04-14 [id number]  BSW able to make contact with both the patient and his sister, [name] as indicated as the point of contact on patients referral. [name] able to verify patients HIPAA identifiers. Marie placed this BSW on speaker phone allowing patient to actively participate in community resource consult as well. Marie requesting information on available transportation options for the patient. Lelan Pons stated she no longer feels the patient is safe to drive. Lelan Pons has taken patients keys from him and patients cousin is currently in possession of keys. During today's call Lelan Pons informed this BSW they are in the process of moving patient to an ALF community so they anticipate a short-term need for transportation.  BSW spoke with both the patient and his sister about different options such as Armed forces technical officer, [place], I-Ride program through [employer], and [employer] for seniors. Lelan Pons stated her main concern is patients ability to call days in advance to request needed transportation. BSW discussed utilizing [employer] as it has an app which would allow someone other than the patient to coordinate transportation.  Lelan Pons stated a relative attends [employer] which has a Administrator similar to [employer]. Lelan Pons plans to contact this program today for more information as well.  BSW to mail information on the previously discussed services to patients home for both the patient and his sister to review. BSW provided both the patient and his sister with contact information if questions or needs arise before next scheduled call. BSW to contact patient and his sister next week to verify mailing received and assist with application process if desired.  [name], Arita Miss, CDP Social Worker [phone]

## 2017-07-08 NOTE — Assessment & Plan Note (Signed)

## 2017-07-09 MED ORDER — TUBERCULIN PPD 5 UNIT/0.1ML ID SOLN: 5.0000 [IU] | Freq: Once | INTRADERMAL | Status: AC

## 2017-07-09 NOTE — Addendum Note (Signed)
Addended by: Karle Barr on: 07/09/2017 08:52 AM   Modules accepted: Orders

## 2017-07-11 ENCOUNTER — Other Ambulatory Visit: Payer: Self-pay

## 2017-07-11 ENCOUNTER — Other Ambulatory Visit: Payer: Self-pay | Admitting: *Deleted

## 2017-07-11 NOTE — Patient Outreach (Signed)
Alfred Armstrong  07/11/2017  Alfred Armstrong Alfred Armstrong, Alfred Armstrong [id number]   CSW was able to make contact with patient's sister, Alfred Armstrong today to follow-up regarding long-term care assisted living placement for patient.  CSW explained to Mrs. Alfred Armstrong that CSW has been able to obtain Alfred completed and signed FL-2 Form from patient's Primary Care Physician, Dr. Scarlette Armstrong.  However, there is a slight dilemma, as Dr. Ronnald Armstrong indicated that patient's recommended level of care is skilled nursing facility, where as Mrs. Alfred Armstrong and I have been looking into assisted living placement for patient.  Mrs. Alfred Armstrong reported that she has already found an assisted living facility in [facility] where she will be placing patient because it is close to her current place of residence.  CSW agreed to fax patient's FL-2 Form to this facility ([facility]) to see if they have male long-term care beds available, as well as to ensure that they will be able to accommodate patient's daily care needs.  CSW has sent a request to Alfred Armstrong, social work Social worker, also with Scientist, clinical (histocompatibility and immunogenetics), to fax Alfred FL-2 Form to [facility] today.  On Alfred fax cover sheet, CSW has requested that a representative with [facility] admissions department contact Mrs. Alfred Armstrong directly regarding bed offers.  CSW then agreed to follow-up with Mrs. Alfred Armstrong on Monday, July 14, 2017 to have her report findings.  If a Alfred FL-2 Form needs to be obtained, CSW will contact Dr. Ronnald Armstrong to make this request.  Alfred Armstrong     Most Recent Value  Care Plan Problem Armstrong  Inability to prepare meals in Alfred home.  Role Documenting Alfred Problem Armstrong  Clinical Social Worker  Care Plan for Problem Armstrong  Active  Alfred Armstrong CM Short Term Goal #1   Eligibility will be determined for patient with regards to Alfred Armstrong through [facility] [place], within Alfred next two weeks.   THN CM Short Term Goal #1 Start Date  07/07/17    Alfred Armstrong CM Care Plan Problem Two     Most Recent Value  Care Plan Problem Two  Lack of in-home caregiver services.  Role Documenting Alfred Problem Two  Clinical Social Worker  Care Plan for Problem Two  Active  THN CM Short Term Goal #1   Patient and sister will review Alfred list of in-home health agencies and decide on an agency of choice, within Alfred next two weeks.  THN CM Short Term Goal #1 Start Date  07/07/17  Alfred Armstrong CM Short Term Goal #1 Met Date   07/11/17  Interventions for Short Term Goal #2   Patient and patient's sister have decided that patient will now be placed in an assisted living facility, hopefully within Alfred next week.    THN CM Care Plan Problem Three     Most Recent Value  Care Plan Problem Three  Inability to perform activities of daily living independently.  Role Documenting Alfred Problem Three  Clinical Social Worker  Care Plan for Problem Three  Active  Alfred Armstrong Long Term Goal   Patient will receive assisted living placement in an assessed living facility of choice, within Alfred next 45 days.  THN Long Term Goal Start Date  07/07/17  Alfred Armstrong CM Short Term Goal #1   Patient's sister will contact patient's primary care physician to request completion of an FL-2 Form and administration of a TB Skin Test, within Alfred next  two weeks.  THN CM Short Term Goal #1 Start Date  07/07/17  Alfred Armstrong CM Short Term Goal #1 Met Date  07/11/17  Interventions for Short Term Goal #1  Patient's FL-2 Form has been obtained from patient's primary care physician.  THN CM Short Term Goal #2   Patient and patient's sister will decide on a list of assisted living facilities where they would like for CSW to fax patient's FL-2 Form too, within Alfred next two weeks.  THN CM Short Term Goal #2 Start Date  07/07/17  Alfred Armstrong CM Short Term Goal #2 Met Date  07/11/17  Interventions for Short Term Goal #2  Patient and patient's sister have decided upon an assisted living facility in  Alfred Armstrong, Alfred Armstrong, called [facility].      Alfred Armstrong, BSW, MSW, LCSW  Licensed Education officer, [facility]  Mailing [place] [address] [place], [place] [address] Physical Address-[address], [place], [place] [address] Toll Free Main # [phone] Fax # [phone] Cell # [phone]  Office # [phone] [email or web address]

## 2017-07-11 NOTE — Patient Outreach (Signed)
[place] [facility]) Care Management  07/11/2017  Alfred Armstrong Feb 16, 1931 [id number]   BSW faxed patients FL2 to [facility].  [name], Arita Miss, CDP Social Worker [phone]

## 2017-07-14 ENCOUNTER — Ambulatory Visit: Payer: Self-pay

## 2017-07-14 ENCOUNTER — Other Ambulatory Visit: Payer: Self-pay | Admitting: *Deleted

## 2017-07-14 NOTE — Patient Outreach (Signed)
[facility] [place] [facility]) Care Management  07/14/2017  Alfred Armstrong 1931/11/22 [id number]   CSW was able to make contact with patient's sister, [name] today to follow-up regarding long-term care placement arrangements for patient.  CSW explained to [name] that CSW was able to make contact with a representative from the Admissions Department at [facility] ([place] of choice) to confirm that they received patient's FL-2 Form on Friday, July 11, 2017 via fax.  The admissions coordinator agreed that they are able to make a bed offer on patient, as they specialized in memory impaired services.  CSW has submitted an In Basket Message to patient's Primary Care Physician, [name], requesting that the FL-2 Form be changed from skilled nursing level care to assisted living or domiciliary care.  Once the corrected FL-2 Form has been obtained, CSW agreed to fax the form to the Admissions Department at [facility] so that they will have the correct version.  [name] is hopeful to get patient placed at the facility early next week, as that is when she is planning to return home to [place], [place].  Patient is still on the waiting list for Mobile Me[facility] through [facility] [place], but CSW will contact a representative with [facility] today to have patient's name removed, as patient will be residing in [place] within the next week.  [name] was agreeable to CSW removing patient's name from the waiting list.  CSW agreed to follow-up with [name] as soon as the new FL-2 Form has been faxed to the Admissions Department at [facility].  THN CM Care Plan Problem One     Most Recent Value  Care Plan Problem One  Inability to prepare meals in the home.  Role Documenting the Problem One  Clinical Social Worker  Care Plan for Problem One  Active  [facility] CM Short Term Goal #1   Eligibility will be  determined for patient with regards to [employer] through [facility] [place], within the next two weeks.  THN CM Short Term Goal #1 Start Date  07/07/17    [facility] CM Care Plan Problem Two     Most Recent Value  Care Plan Problem Two  Lack of in-home caregiver services.  Role Documenting the Problem Two  Clinical Social Worker  Care Plan for Problem Two  Active  THN CM Short Term Goal #1   Patient and sister will review the list of in-home health agencies and decide on an agency of choice, within the next two weeks.  THN CM Short Term Goal #1 Start Date  07/07/17  THN CM Short Term Goal #1 Met Date   07/11/17    [facility] CM Care Plan Problem Three     Most Recent Value  Care Plan Problem Three  Inability to perform activities of daily living independently.  Role Documenting the Problem Three  Clinical Social Worker  Care Plan for Problem Three  Active  [facility] Long Term Goal   Patient will receive assisted living placement in an assessed living facility of choice, within the next 45 days.  THN Long Term Goal Start Date  07/07/17  THN Long Term Goal Met Date  07/14/17  Interventions for Problem Three Long Term Goal  Patient will receive long-term care placement into an assisted living facility called [facility] early next week.  THN CM Short Term Goal #1   Patient's sister will  contact patient's primary care physician to request completion of an FL-2 Form and administration of a TB Skin Test, within the next two weeks.  THN CM Short Term Goal #1 Start Date  07/07/17  THN CM Short Term Goal #1 Met Date  07/11/17  THN CM Short Term Goal #2   Patient and patient's sister will decide on a list of assisted living facilities where they would like for CSW to fax patient's FL-2 Form too, within the next two weeks.  THN CM Short Term Goal #2 Start Date  07/07/17  [facility] CM Short Term Goal #2 Met Date  07/11/17      [name], BSW, MSW, [place]  Licensed Clinical Social Worker   [place]  Mailing [place]. [address], [place], [place] [address] Physical Address-[address], [place], [place] [address] Toll Free Main # [phone] Fax # [phone] Cell # [phone]  Office # [phone] [email or web address]

## 2017-07-15 ENCOUNTER — Other Ambulatory Visit: Payer: Self-pay

## 2017-07-15 NOTE — Patient Outreach (Signed)
[facility] [facility]) Care Management  07/15/2017  Alfred Armstrong 1931/09/16 [id number]  Upon chart review it is noted that patients sister is actively working with CSW, [name], to have patient placed at [facility]. BSW has discussed patients case with CSW. BSW to remove self from care team as patient will no longer reside in [facility] nor will patient need transportation resources.  [name], Arita Miss, CDP Social Worker [phone]

## 2017-07-17 ENCOUNTER — Encounter: Payer: Self-pay | Admitting: *Deleted

## 2017-07-17 ENCOUNTER — Other Ambulatory Visit: Payer: Self-pay | Admitting: *Deleted

## 2017-07-17 NOTE — Patient Outreach (Signed)
Alfred Armstrong) Care Management  07/17/2017  Alfred Armstrong [birth year] [id number]   CSW was able to make contact with patient's sister, Alfred Armstrong today to follow-up regarding long-term care placement arrangements for patient in an assisted living facility.  CSW explained to Mrs. Alfred Armstrong that CSW was able to fax the updated FL-2 Form to [facility] of choice, and received a bed offer on patient from the Admissions Director.  The admissions director reported that they will be ready to accept patient into their facility on Wednesday, July 23, 2017.  Mrs. Alfred Armstrong was extremely pleased to hear this news, reporting that she will make arrangements to get patient to the facility on Wednesday.   CSW will perform a case closure on patient, as all goals of treatment have been met from social work standpoint and no additional social work needs have been identified at this time.  CSW will fax an update to patient's Primary Care Physician, Alfred Armstrong to ensure that they are aware of CSW's involvement with patient's plan of care.   THN CM Care Plan Problem One     Most Recent Value  Care Plan Problem One  Inability to prepare meals in the home.  Role Documenting the Problem One  Clinical Social Worker  Care Plan for Problem One  Active  [facility] CM Short Term Goal #1   Eligibility will be determined for patient with regards to [employer] through [facility] [place], within the next two weeks.  THN CM Short Term Goal #1 Start Date  07/07/17  [facility] CM Short Term Goal #1 Met Date  07/17/17  Interventions for Short Term Goal #1  Patient has been taken off the waiting list for Mobile Meals in [facility], as patient will be moving to [facility] in one week.    THN CM Care Plan Problem Two     Most Recent Value  Care Plan Problem Two  Lack of in-home caregiver services.  Role Documenting the Problem Two  Clinical Social Worker  Care  Plan for Problem Two  Active  THN CM Short Term Goal #1   Patient and sister will review the list of in-home health agencies and decide on an agency of choice, within the next two weeks.  THN CM Short Term Goal #1 Start Date  07/07/17  THN CM Short Term Goal #1 Met Date   07/11/17    [facility] CM Care Plan Problem Three     Most Recent Value  Care Plan Problem Three  Inability to perform activities of daily living independently.  Role Documenting the Problem Three  Clinical Social Worker  Care Plan for Problem Three  Active  [facility] Long Term Goal   Patient will receive assisted living placement in an assessed living facility of choice, within the next 45 days.  THN Long Term Goal Start Date  07/07/17  THN Long Term Goal Met Date  07/14/17  THN CM Short Term Goal #1   Patient's sister will contact patient's primary care physician to request completion of an FL-2 Form and administration of a TB Skin Test, within the next two weeks.  THN CM Short Term Goal #1 Start Date  07/07/17  THN CM Short Term Goal #1 Met Date  07/11/17  [facility] CM Short Term Goal #2   Patient and patient's sister will decide on a list of assisted living facilities where they would like for CSW to fax patient's FL-2 Form  too, within the next two weeks.  THN CM Short Term Goal #2 Start Date  07/07/17  [facility] CM Short Term Goal #2 Met Date  07/11/17    Alfred Armstrong, BSW, MSW, [place]  Licensed Clinical Social Worker  [place]  Mailing [place]. [address], [place], [place] [address] Physical Address-[address], [place], [place] [address] Toll Free Main # [phone] Fax # [phone] Cell # [phone]  Office # [phone] [email or web address]

## 2017-07-18 ENCOUNTER — Ambulatory Visit: Payer: Self-pay | Admitting: *Deleted

## 2017-07-22 ENCOUNTER — Telehealth: Payer: Self-pay | Admitting: Internal Medicine

## 2017-07-22 NOTE — Telephone Encounter (Signed)
Copied from [place] [phone]. Topic: General - Other >> Jul 22, 2017  1:54 PM Cecelia Byars, NT wrote: Reason for CRM: Patients sister called and said he will be going to an assisted living facility .

## 2017-07-23 ENCOUNTER — Encounter: Payer: Medicare HMO | Admitting: Internal Medicine

## 2017-07-24 ENCOUNTER — Telehealth: Payer: Self-pay | Admitting: Internal Medicine

## 2017-07-24 NOTE — Telephone Encounter (Signed)
I reviewed chart. Printed immunization record and contact pt sister and informed that I do not see TB screening. Sister stated that the assisted living facility can do one without PCP order.   Immunization record has been faxed.

## 2017-07-24 NOTE — Telephone Encounter (Signed)
Copied from [place] [phone]. Topic: General - Other >> Jul 24, 2017  3:41 PM Cecelia Byars, NT wrote: Reason for CRM: The patients sister said he is presently in the process of being admitted into the assisted living facility and they need a copy of his tb immunization faxed to [phone] attention Constance Holster ,as quickly as possible or he will not be able to stay tonight

## 2017-07-24 NOTE — Telephone Encounter (Signed)
I do not see where the patient had one. Please check be hide me and make sure I am not missing it.

## 2017-07-26 DIAGNOSIS — J8410 Pulmonary fibrosis, unspecified: Secondary | ICD-10-CM | POA: Diagnosis not present

## 2017-07-26 DIAGNOSIS — R410 Disorientation, unspecified: Secondary | ICD-10-CM | POA: Diagnosis not present

## 2017-07-26 DIAGNOSIS — E785 Hyperlipidemia, unspecified: Secondary | ICD-10-CM | POA: Diagnosis not present

## 2017-07-26 DIAGNOSIS — R918 Other nonspecific abnormal finding of lung field: Secondary | ICD-10-CM | POA: Diagnosis not present

## 2017-07-26 DIAGNOSIS — I959 Hypotension, unspecified: Secondary | ICD-10-CM | POA: Diagnosis not present

## 2017-07-26 DIAGNOSIS — R69 Illness, unspecified: Secondary | ICD-10-CM | POA: Diagnosis not present

## 2017-07-26 DIAGNOSIS — E119 Type 2 diabetes mellitus without complications: Secondary | ICD-10-CM | POA: Diagnosis not present

## 2017-07-26 DIAGNOSIS — J181 Lobar pneumonia, unspecified organism: Secondary | ICD-10-CM | POA: Diagnosis not present

## 2017-07-26 DIAGNOSIS — I2510 Atherosclerotic heart disease of native coronary artery without angina pectoris: Secondary | ICD-10-CM | POA: Diagnosis not present

## 2017-07-26 DIAGNOSIS — J189 Pneumonia, unspecified organism: Secondary | ICD-10-CM | POA: Diagnosis not present

## 2017-07-26 DIAGNOSIS — G9340 Encephalopathy, unspecified: Secondary | ICD-10-CM | POA: Diagnosis not present

## 2017-07-26 DIAGNOSIS — I214 Non-ST elevation (NSTEMI) myocardial infarction: Secondary | ICD-10-CM | POA: Diagnosis not present

## 2017-07-26 DIAGNOSIS — I10 Essential (primary) hypertension: Secondary | ICD-10-CM | POA: Diagnosis not present

## 2017-07-27 DIAGNOSIS — J8410 Pulmonary fibrosis, unspecified: Secondary | ICD-10-CM | POA: Diagnosis not present

## 2017-07-27 DIAGNOSIS — I10 Essential (primary) hypertension: Secondary | ICD-10-CM | POA: Diagnosis not present

## 2017-07-27 DIAGNOSIS — J984 Other disorders of lung: Secondary | ICD-10-CM | POA: Diagnosis not present

## 2017-07-27 DIAGNOSIS — R918 Other nonspecific abnormal finding of lung field: Secondary | ICD-10-CM | POA: Diagnosis not present

## 2017-07-27 DIAGNOSIS — J181 Lobar pneumonia, unspecified organism: Secondary | ICD-10-CM | POA: Diagnosis not present

## 2017-07-27 DIAGNOSIS — I959 Hypotension, unspecified: Secondary | ICD-10-CM | POA: Diagnosis not present

## 2017-07-27 DIAGNOSIS — I2510 Atherosclerotic heart disease of native coronary artery without angina pectoris: Secondary | ICD-10-CM | POA: Diagnosis not present

## 2017-07-27 DIAGNOSIS — J90 Pleural effusion, not elsewhere classified: Secondary | ICD-10-CM | POA: Diagnosis not present

## 2017-07-27 DIAGNOSIS — I214 Non-ST elevation (NSTEMI) myocardial infarction: Secondary | ICD-10-CM | POA: Diagnosis not present

## 2017-07-27 DIAGNOSIS — E119 Type 2 diabetes mellitus without complications: Secondary | ICD-10-CM | POA: Diagnosis not present

## 2017-07-27 DIAGNOSIS — G9340 Encephalopathy, unspecified: Secondary | ICD-10-CM | POA: Diagnosis not present

## 2017-07-27 DIAGNOSIS — R0902 Hypoxemia: Secondary | ICD-10-CM | POA: Diagnosis not present

## 2017-07-27 DIAGNOSIS — E785 Hyperlipidemia, unspecified: Secondary | ICD-10-CM | POA: Diagnosis not present

## 2017-07-27 DIAGNOSIS — R410 Disorientation, unspecified: Secondary | ICD-10-CM | POA: Diagnosis not present

## 2017-07-27 DIAGNOSIS — R69 Illness, unspecified: Secondary | ICD-10-CM | POA: Diagnosis not present

## 2017-07-27 DIAGNOSIS — J189 Pneumonia, unspecified organism: Secondary | ICD-10-CM | POA: Diagnosis not present

## 2017-08-28 DIAGNOSIS — I10 Essential (primary) hypertension: Secondary | ICD-10-CM | POA: Diagnosis not present

## 2017-08-28 DIAGNOSIS — M6281 Muscle weakness (generalized): Secondary | ICD-10-CM | POA: Diagnosis not present

## 2017-08-28 DIAGNOSIS — E785 Hyperlipidemia, unspecified: Secondary | ICD-10-CM | POA: Diagnosis not present

## 2017-08-28 DIAGNOSIS — R69 Illness, unspecified: Secondary | ICD-10-CM | POA: Diagnosis not present

## 2017-08-28 DIAGNOSIS — E1142 Type 2 diabetes mellitus with diabetic polyneuropathy: Secondary | ICD-10-CM | POA: Diagnosis not present

## 2017-08-28 DIAGNOSIS — I2510 Atherosclerotic heart disease of native coronary artery without angina pectoris: Secondary | ICD-10-CM | POA: Diagnosis not present

## 2017-08-29 DIAGNOSIS — E1142 Type 2 diabetes mellitus with diabetic polyneuropathy: Secondary | ICD-10-CM | POA: Diagnosis not present

## 2017-08-29 DIAGNOSIS — E119 Type 2 diabetes mellitus without complications: Secondary | ICD-10-CM | POA: Diagnosis not present

## 2017-08-29 DIAGNOSIS — E785 Hyperlipidemia, unspecified: Secondary | ICD-10-CM | POA: Diagnosis not present

## 2017-08-29 DIAGNOSIS — E039 Hypothyroidism, unspecified: Secondary | ICD-10-CM | POA: Diagnosis not present

## 2017-08-29 DIAGNOSIS — D649 Anemia, unspecified: Secondary | ICD-10-CM | POA: Diagnosis not present

## 2017-09-01 DIAGNOSIS — R0989 Other specified symptoms and signs involving the circulatory and respiratory systems: Secondary | ICD-10-CM | POA: Diagnosis not present

## 2017-09-02 DIAGNOSIS — I10 Essential (primary) hypertension: Secondary | ICD-10-CM | POA: Diagnosis not present

## 2017-09-02 DIAGNOSIS — E785 Hyperlipidemia, unspecified: Secondary | ICD-10-CM | POA: Diagnosis not present

## 2017-09-02 DIAGNOSIS — M6281 Muscle weakness (generalized): Secondary | ICD-10-CM | POA: Diagnosis not present

## 2017-09-02 DIAGNOSIS — R69 Illness, unspecified: Secondary | ICD-10-CM | POA: Diagnosis not present

## 2017-09-02 DIAGNOSIS — I2510 Atherosclerotic heart disease of native coronary artery without angina pectoris: Secondary | ICD-10-CM | POA: Diagnosis not present

## 2017-09-02 DIAGNOSIS — J948 Other specified pleural conditions: Secondary | ICD-10-CM | POA: Diagnosis not present

## 2017-09-02 DIAGNOSIS — E1142 Type 2 diabetes mellitus with diabetic polyneuropathy: Secondary | ICD-10-CM | POA: Diagnosis not present

## 2017-09-03 DIAGNOSIS — R918 Other nonspecific abnormal finding of lung field: Secondary | ICD-10-CM | POA: Diagnosis not present

## 2017-09-03 DIAGNOSIS — I5021 Acute systolic (congestive) heart failure: Secondary | ICD-10-CM | POA: Diagnosis not present

## 2017-09-03 DIAGNOSIS — F419 Anxiety disorder, unspecified: Secondary | ICD-10-CM | POA: Diagnosis not present

## 2017-09-03 DIAGNOSIS — R0902 Hypoxemia: Secondary | ICD-10-CM | POA: Diagnosis not present

## 2017-09-03 DIAGNOSIS — J168 Pneumonia due to other specified infectious organisms: Secondary | ICD-10-CM | POA: Diagnosis not present

## 2017-09-03 DIAGNOSIS — J984 Other disorders of lung: Secondary | ICD-10-CM | POA: Diagnosis not present

## 2017-09-03 DIAGNOSIS — I25709 Atherosclerosis of coronary artery bypass graft(s), unspecified, with unspecified angina pectoris: Secondary | ICD-10-CM | POA: Diagnosis not present

## 2017-09-03 DIAGNOSIS — F0390 Unspecified dementia without behavioral disturbance: Secondary | ICD-10-CM | POA: Diagnosis not present

## 2017-09-03 DIAGNOSIS — R404 Transient alteration of awareness: Secondary | ICD-10-CM | POA: Diagnosis not present

## 2017-09-03 DIAGNOSIS — Z743 Need for continuous supervision: Secondary | ICD-10-CM | POA: Diagnosis not present

## 2017-09-03 DIAGNOSIS — E119 Type 2 diabetes mellitus without complications: Secondary | ICD-10-CM | POA: Diagnosis not present

## 2017-09-03 DIAGNOSIS — R069 Unspecified abnormalities of breathing: Secondary | ICD-10-CM | POA: Diagnosis not present

## 2017-09-03 DIAGNOSIS — I119 Hypertensive heart disease without heart failure: Secondary | ICD-10-CM | POA: Diagnosis not present

## 2017-09-03 DIAGNOSIS — R0600 Dyspnea, unspecified: Secondary | ICD-10-CM | POA: Diagnosis not present

## 2017-09-03 DIAGNOSIS — R69 Illness, unspecified: Secondary | ICD-10-CM | POA: Diagnosis not present

## 2017-09-03 DIAGNOSIS — I2510 Atherosclerotic heart disease of native coronary artery without angina pectoris: Secondary | ICD-10-CM | POA: Diagnosis not present

## 2017-09-03 DIAGNOSIS — R410 Disorientation, unspecified: Secondary | ICD-10-CM | POA: Diagnosis not present

## 2017-09-03 DIAGNOSIS — J449 Chronic obstructive pulmonary disease, unspecified: Secondary | ICD-10-CM | POA: Diagnosis not present

## 2017-09-03 DIAGNOSIS — R0602 Shortness of breath: Secondary | ICD-10-CM | POA: Diagnosis not present

## 2017-09-03 DIAGNOSIS — R0609 Other forms of dyspnea: Secondary | ICD-10-CM | POA: Diagnosis not present

## 2017-09-03 DIAGNOSIS — J90 Pleural effusion, not elsewhere classified: Secondary | ICD-10-CM | POA: Diagnosis not present

## 2017-09-03 DIAGNOSIS — I255 Ischemic cardiomyopathy: Secondary | ICD-10-CM | POA: Diagnosis not present

## 2017-09-03 DIAGNOSIS — E785 Hyperlipidemia, unspecified: Secondary | ICD-10-CM | POA: Diagnosis not present

## 2017-09-03 DIAGNOSIS — K219 Gastro-esophageal reflux disease without esophagitis: Secondary | ICD-10-CM | POA: Diagnosis not present

## 2017-09-03 DIAGNOSIS — I509 Heart failure, unspecified: Secondary | ICD-10-CM | POA: Diagnosis not present

## 2017-09-03 DIAGNOSIS — I10 Essential (primary) hypertension: Secondary | ICD-10-CM | POA: Diagnosis not present

## 2017-09-03 DIAGNOSIS — I110 Hypertensive heart disease with heart failure: Secondary | ICD-10-CM | POA: Diagnosis not present

## 2017-09-04 DIAGNOSIS — R0600 Dyspnea, unspecified: Secondary | ICD-10-CM | POA: Diagnosis not present

## 2017-09-04 DIAGNOSIS — I2510 Atherosclerotic heart disease of native coronary artery without angina pectoris: Secondary | ICD-10-CM | POA: Diagnosis not present

## 2017-09-04 DIAGNOSIS — R69 Illness, unspecified: Secondary | ICD-10-CM | POA: Diagnosis not present

## 2017-09-04 DIAGNOSIS — E785 Hyperlipidemia, unspecified: Secondary | ICD-10-CM | POA: Diagnosis not present

## 2017-09-04 DIAGNOSIS — I509 Heart failure, unspecified: Secondary | ICD-10-CM | POA: Diagnosis not present

## 2017-09-04 DIAGNOSIS — I5021 Acute systolic (congestive) heart failure: Secondary | ICD-10-CM | POA: Diagnosis not present

## 2017-09-04 DIAGNOSIS — I119 Hypertensive heart disease without heart failure: Secondary | ICD-10-CM | POA: Diagnosis not present

## 2017-09-04 DIAGNOSIS — R0602 Shortness of breath: Secondary | ICD-10-CM | POA: Diagnosis not present

## 2017-09-04 DIAGNOSIS — I255 Ischemic cardiomyopathy: Secondary | ICD-10-CM | POA: Diagnosis not present

## 2017-09-04 DIAGNOSIS — I25709 Atherosclerosis of coronary artery bypass graft(s), unspecified, with unspecified angina pectoris: Secondary | ICD-10-CM | POA: Diagnosis not present

## 2017-09-04 DIAGNOSIS — E119 Type 2 diabetes mellitus without complications: Secondary | ICD-10-CM | POA: Diagnosis not present

## 2017-09-05 DIAGNOSIS — E785 Hyperlipidemia, unspecified: Secondary | ICD-10-CM | POA: Diagnosis not present

## 2017-09-05 DIAGNOSIS — R69 Illness, unspecified: Secondary | ICD-10-CM | POA: Diagnosis not present

## 2017-09-05 DIAGNOSIS — J90 Pleural effusion, not elsewhere classified: Secondary | ICD-10-CM | POA: Diagnosis not present

## 2017-09-05 DIAGNOSIS — I119 Hypertensive heart disease without heart failure: Secondary | ICD-10-CM | POA: Diagnosis not present

## 2017-09-05 DIAGNOSIS — I509 Heart failure, unspecified: Secondary | ICD-10-CM | POA: Diagnosis not present

## 2017-09-05 DIAGNOSIS — R0600 Dyspnea, unspecified: Secondary | ICD-10-CM | POA: Diagnosis not present

## 2017-09-05 DIAGNOSIS — E119 Type 2 diabetes mellitus without complications: Secondary | ICD-10-CM | POA: Diagnosis not present

## 2017-09-05 DIAGNOSIS — I5021 Acute systolic (congestive) heart failure: Secondary | ICD-10-CM | POA: Diagnosis not present

## 2017-09-05 DIAGNOSIS — I2510 Atherosclerotic heart disease of native coronary artery without angina pectoris: Secondary | ICD-10-CM | POA: Diagnosis not present

## 2017-09-05 DIAGNOSIS — I25709 Atherosclerosis of coronary artery bypass graft(s), unspecified, with unspecified angina pectoris: Secondary | ICD-10-CM | POA: Diagnosis not present

## 2017-09-05 DIAGNOSIS — I255 Ischemic cardiomyopathy: Secondary | ICD-10-CM | POA: Diagnosis not present

## 2017-09-06 DIAGNOSIS — I5021 Acute systolic (congestive) heart failure: Secondary | ICD-10-CM | POA: Diagnosis not present

## 2017-09-06 DIAGNOSIS — I2510 Atherosclerotic heart disease of native coronary artery without angina pectoris: Secondary | ICD-10-CM | POA: Diagnosis not present

## 2017-09-06 DIAGNOSIS — E119 Type 2 diabetes mellitus without complications: Secondary | ICD-10-CM | POA: Diagnosis not present

## 2017-09-06 DIAGNOSIS — I119 Hypertensive heart disease without heart failure: Secondary | ICD-10-CM | POA: Diagnosis not present

## 2017-09-06 DIAGNOSIS — E785 Hyperlipidemia, unspecified: Secondary | ICD-10-CM | POA: Diagnosis not present

## 2017-09-06 DIAGNOSIS — I509 Heart failure, unspecified: Secondary | ICD-10-CM | POA: Diagnosis not present

## 2017-09-06 DIAGNOSIS — R69 Illness, unspecified: Secondary | ICD-10-CM | POA: Diagnosis not present

## 2017-09-07 DIAGNOSIS — R404 Transient alteration of awareness: Secondary | ICD-10-CM | POA: Diagnosis not present

## 2017-09-07 DIAGNOSIS — R69 Illness, unspecified: Secondary | ICD-10-CM | POA: Diagnosis not present

## 2017-09-07 DIAGNOSIS — E119 Type 2 diabetes mellitus without complications: Secondary | ICD-10-CM | POA: Diagnosis not present

## 2017-09-07 DIAGNOSIS — I2510 Atherosclerotic heart disease of native coronary artery without angina pectoris: Secondary | ICD-10-CM | POA: Diagnosis not present

## 2017-09-07 DIAGNOSIS — R410 Disorientation, unspecified: Secondary | ICD-10-CM | POA: Diagnosis not present

## 2017-09-07 DIAGNOSIS — R0602 Shortness of breath: Secondary | ICD-10-CM | POA: Diagnosis not present

## 2017-09-07 DIAGNOSIS — I119 Hypertensive heart disease without heart failure: Secondary | ICD-10-CM | POA: Diagnosis not present

## 2017-09-07 DIAGNOSIS — I5021 Acute systolic (congestive) heart failure: Secondary | ICD-10-CM | POA: Diagnosis not present

## 2017-09-07 DIAGNOSIS — Z743 Need for continuous supervision: Secondary | ICD-10-CM | POA: Diagnosis not present

## 2017-09-07 DIAGNOSIS — E785 Hyperlipidemia, unspecified: Secondary | ICD-10-CM | POA: Diagnosis not present

## 2017-09-11 DIAGNOSIS — I2510 Atherosclerotic heart disease of native coronary artery without angina pectoris: Secondary | ICD-10-CM | POA: Diagnosis not present

## 2017-09-11 DIAGNOSIS — E1142 Type 2 diabetes mellitus with diabetic polyneuropathy: Secondary | ICD-10-CM | POA: Diagnosis not present

## 2017-09-11 DIAGNOSIS — J948 Other specified pleural conditions: Secondary | ICD-10-CM | POA: Diagnosis not present

## 2017-09-11 DIAGNOSIS — R69 Illness, unspecified: Secondary | ICD-10-CM | POA: Diagnosis not present

## 2017-09-11 DIAGNOSIS — E785 Hyperlipidemia, unspecified: Secondary | ICD-10-CM | POA: Diagnosis not present

## 2017-09-11 DIAGNOSIS — I10 Essential (primary) hypertension: Secondary | ICD-10-CM | POA: Diagnosis not present

## 2017-09-11 DIAGNOSIS — M6281 Muscle weakness (generalized): Secondary | ICD-10-CM | POA: Diagnosis not present

## 2017-09-16 DIAGNOSIS — I2510 Atherosclerotic heart disease of native coronary artery without angina pectoris: Secondary | ICD-10-CM | POA: Diagnosis not present

## 2017-09-16 DIAGNOSIS — Z743 Need for continuous supervision: Secondary | ICD-10-CM | POA: Diagnosis not present

## 2017-09-16 DIAGNOSIS — E785 Hyperlipidemia, unspecified: Secondary | ICD-10-CM | POA: Diagnosis not present

## 2017-09-16 DIAGNOSIS — K219 Gastro-esophageal reflux disease without esophagitis: Secondary | ICD-10-CM | POA: Diagnosis not present

## 2017-09-16 DIAGNOSIS — I10 Essential (primary) hypertension: Secondary | ICD-10-CM | POA: Diagnosis not present

## 2017-09-16 DIAGNOSIS — R410 Disorientation, unspecified: Secondary | ICD-10-CM | POA: Diagnosis not present

## 2017-09-16 DIAGNOSIS — F0390 Unspecified dementia without behavioral disturbance: Secondary | ICD-10-CM | POA: Diagnosis not present

## 2017-09-16 DIAGNOSIS — Z8701 Personal history of pneumonia (recurrent): Secondary | ICD-10-CM | POA: Diagnosis not present

## 2017-09-16 DIAGNOSIS — F419 Anxiety disorder, unspecified: Secondary | ICD-10-CM | POA: Diagnosis not present

## 2017-09-16 DIAGNOSIS — R69 Illness, unspecified: Secondary | ICD-10-CM | POA: Diagnosis not present

## 2017-09-16 DIAGNOSIS — R404 Transient alteration of awareness: Secondary | ICD-10-CM | POA: Diagnosis not present

## 2017-09-16 DIAGNOSIS — I959 Hypotension, unspecified: Secondary | ICD-10-CM | POA: Diagnosis not present

## 2017-09-16 DIAGNOSIS — R4182 Altered mental status, unspecified: Secondary | ICD-10-CM | POA: Diagnosis not present

## 2017-09-16 DIAGNOSIS — R5383 Other fatigue: Secondary | ICD-10-CM | POA: Diagnosis not present

## 2017-09-16 DIAGNOSIS — R531 Weakness: Secondary | ICD-10-CM | POA: Diagnosis not present

## 2017-09-16 DIAGNOSIS — E119 Type 2 diabetes mellitus without complications: Secondary | ICD-10-CM | POA: Diagnosis not present

## 2017-10-15 DIAGNOSIS — H25013 Cortical age-related cataract, bilateral: Secondary | ICD-10-CM | POA: Diagnosis not present

## 2017-10-15 DIAGNOSIS — H2513 Age-related nuclear cataract, bilateral: Secondary | ICD-10-CM | POA: Diagnosis not present

## 2017-10-23 DIAGNOSIS — R69 Illness, unspecified: Secondary | ICD-10-CM | POA: Diagnosis not present

## 2017-10-23 DIAGNOSIS — F331 Major depressive disorder, recurrent, moderate: Secondary | ICD-10-CM | POA: Diagnosis not present

## 2017-10-23 DIAGNOSIS — F411 Generalized anxiety disorder: Secondary | ICD-10-CM | POA: Diagnosis not present

## 2017-10-30 DIAGNOSIS — E1142 Type 2 diabetes mellitus with diabetic polyneuropathy: Secondary | ICD-10-CM | POA: Diagnosis not present

## 2017-10-30 DIAGNOSIS — R634 Abnormal weight loss: Secondary | ICD-10-CM | POA: Diagnosis not present

## 2017-10-30 DIAGNOSIS — M6281 Muscle weakness (generalized): Secondary | ICD-10-CM | POA: Diagnosis not present

## 2017-10-30 DIAGNOSIS — I10 Essential (primary) hypertension: Secondary | ICD-10-CM | POA: Diagnosis not present

## 2017-10-30 DIAGNOSIS — E785 Hyperlipidemia, unspecified: Secondary | ICD-10-CM | POA: Diagnosis not present

## 2017-10-30 DIAGNOSIS — I2510 Atherosclerotic heart disease of native coronary artery without angina pectoris: Secondary | ICD-10-CM | POA: Diagnosis not present

## 2017-10-30 DIAGNOSIS — R69 Illness, unspecified: Secondary | ICD-10-CM | POA: Diagnosis not present

## 2017-10-30 DIAGNOSIS — J948 Other specified pleural conditions: Secondary | ICD-10-CM | POA: Diagnosis not present

## 2017-10-31 DIAGNOSIS — I10 Essential (primary) hypertension: Secondary | ICD-10-CM | POA: Diagnosis not present

## 2017-10-31 DIAGNOSIS — R69 Illness, unspecified: Secondary | ICD-10-CM | POA: Diagnosis not present

## 2017-10-31 DIAGNOSIS — E785 Hyperlipidemia, unspecified: Secondary | ICD-10-CM | POA: Diagnosis not present

## 2017-10-31 DIAGNOSIS — I5023 Acute on chronic systolic (congestive) heart failure: Secondary | ICD-10-CM | POA: Diagnosis not present

## 2017-10-31 DIAGNOSIS — I420 Dilated cardiomyopathy: Secondary | ICD-10-CM | POA: Diagnosis not present

## 2017-10-31 DIAGNOSIS — I25709 Atherosclerosis of coronary artery bypass graft(s), unspecified, with unspecified angina pectoris: Secondary | ICD-10-CM | POA: Diagnosis not present

## 2017-11-13 DIAGNOSIS — I10 Essential (primary) hypertension: Secondary | ICD-10-CM | POA: Diagnosis not present

## 2017-11-13 DIAGNOSIS — F331 Major depressive disorder, recurrent, moderate: Secondary | ICD-10-CM | POA: Diagnosis not present

## 2017-11-13 DIAGNOSIS — E1142 Type 2 diabetes mellitus with diabetic polyneuropathy: Secondary | ICD-10-CM | POA: Diagnosis not present

## 2017-11-13 DIAGNOSIS — E785 Hyperlipidemia, unspecified: Secondary | ICD-10-CM | POA: Diagnosis not present

## 2017-11-13 DIAGNOSIS — J948 Other specified pleural conditions: Secondary | ICD-10-CM | POA: Diagnosis not present

## 2017-11-13 DIAGNOSIS — F411 Generalized anxiety disorder: Secondary | ICD-10-CM | POA: Diagnosis not present

## 2017-11-13 DIAGNOSIS — R69 Illness, unspecified: Secondary | ICD-10-CM | POA: Diagnosis not present

## 2017-11-13 DIAGNOSIS — R634 Abnormal weight loss: Secondary | ICD-10-CM | POA: Diagnosis not present

## 2017-11-13 DIAGNOSIS — I2510 Atherosclerotic heart disease of native coronary artery without angina pectoris: Secondary | ICD-10-CM | POA: Diagnosis not present

## 2017-11-17 DIAGNOSIS — F331 Major depressive disorder, recurrent, moderate: Secondary | ICD-10-CM | POA: Diagnosis not present

## 2017-11-17 DIAGNOSIS — R69 Illness, unspecified: Secondary | ICD-10-CM | POA: Diagnosis not present

## 2017-11-17 DIAGNOSIS — F411 Generalized anxiety disorder: Secondary | ICD-10-CM | POA: Diagnosis not present

## 2017-11-20 ENCOUNTER — Telehealth: Payer: Self-pay | Admitting: Internal Medicine

## 2017-11-20 NOTE — Telephone Encounter (Signed)
Do you have a blank FL2?

## 2017-11-20 NOTE — Telephone Encounter (Signed)
Copied from [place] [id number] [id number]. Topic: General - Other >> Nov 20, 2017  9:33 AM Bea Graff, NT wrote: Reason for CRM: Pts sister, Lelan Pons calling and states she is having to place her brother in a memory care center and needs the [facility] faxed to [phone]. [place]. Please advise sister once faxed.

## 2017-11-20 NOTE — Telephone Encounter (Signed)
I do. I will bring you one.

## 2017-11-21 DIAGNOSIS — R69 Illness, unspecified: Secondary | ICD-10-CM | POA: Diagnosis not present

## 2017-11-21 DIAGNOSIS — F411 Generalized anxiety disorder: Secondary | ICD-10-CM | POA: Diagnosis not present

## 2017-11-21 DIAGNOSIS — F331 Major depressive disorder, recurrent, moderate: Secondary | ICD-10-CM | POA: Diagnosis not present

## 2017-11-24 DIAGNOSIS — R69 Illness, unspecified: Secondary | ICD-10-CM | POA: Diagnosis not present

## 2017-11-25 NOTE — Telephone Encounter (Signed)
Form has been completed. Given to PCP to sign.

## 2017-11-27 DIAGNOSIS — E785 Hyperlipidemia, unspecified: Secondary | ICD-10-CM | POA: Diagnosis not present

## 2017-11-27 DIAGNOSIS — I2510 Atherosclerotic heart disease of native coronary artery without angina pectoris: Secondary | ICD-10-CM | POA: Diagnosis not present

## 2017-11-27 DIAGNOSIS — R634 Abnormal weight loss: Secondary | ICD-10-CM | POA: Diagnosis not present

## 2017-11-27 DIAGNOSIS — I10 Essential (primary) hypertension: Secondary | ICD-10-CM | POA: Diagnosis not present

## 2017-11-27 DIAGNOSIS — E1142 Type 2 diabetes mellitus with diabetic polyneuropathy: Secondary | ICD-10-CM | POA: Diagnosis not present

## 2017-11-27 DIAGNOSIS — F66 Other sexual disorders: Secondary | ICD-10-CM | POA: Diagnosis not present

## 2017-11-27 DIAGNOSIS — M6281 Muscle weakness (generalized): Secondary | ICD-10-CM | POA: Diagnosis not present

## 2017-11-27 DIAGNOSIS — R69 Illness, unspecified: Secondary | ICD-10-CM | POA: Diagnosis not present

## 2017-11-27 DIAGNOSIS — F0151 Vascular dementia with behavioral disturbance: Secondary | ICD-10-CM | POA: Diagnosis not present

## 2017-11-27 DIAGNOSIS — J948 Other specified pleural conditions: Secondary | ICD-10-CM | POA: Diagnosis not present

## 2017-11-28 DIAGNOSIS — I10 Essential (primary) hypertension: Secondary | ICD-10-CM | POA: Diagnosis not present

## 2017-11-28 DIAGNOSIS — E119 Type 2 diabetes mellitus without complications: Secondary | ICD-10-CM | POA: Diagnosis not present

## 2017-11-28 DIAGNOSIS — F919 Conduct disorder, unspecified: Secondary | ICD-10-CM | POA: Diagnosis not present

## 2017-11-28 DIAGNOSIS — R456 Violent behavior: Secondary | ICD-10-CM | POA: Diagnosis not present

## 2017-11-28 DIAGNOSIS — Z794 Long term (current) use of insulin: Secondary | ICD-10-CM | POA: Diagnosis not present

## 2017-11-28 DIAGNOSIS — R69 Illness, unspecified: Secondary | ICD-10-CM | POA: Diagnosis not present

## 2017-11-28 DIAGNOSIS — I959 Hypotension, unspecified: Secondary | ICD-10-CM | POA: Diagnosis not present

## 2017-11-28 NOTE — Telephone Encounter (Signed)
Form has been faxed to [facility].

## 2017-12-04 DIAGNOSIS — I2510 Atherosclerotic heart disease of native coronary artery without angina pectoris: Secondary | ICD-10-CM | POA: Diagnosis not present

## 2017-12-04 DIAGNOSIS — R634 Abnormal weight loss: Secondary | ICD-10-CM | POA: Diagnosis not present

## 2017-12-04 DIAGNOSIS — E785 Hyperlipidemia, unspecified: Secondary | ICD-10-CM | POA: Diagnosis not present

## 2017-12-04 DIAGNOSIS — J948 Other specified pleural conditions: Secondary | ICD-10-CM | POA: Diagnosis not present

## 2017-12-04 DIAGNOSIS — R69 Illness, unspecified: Secondary | ICD-10-CM | POA: Diagnosis not present

## 2017-12-04 DIAGNOSIS — E1142 Type 2 diabetes mellitus with diabetic polyneuropathy: Secondary | ICD-10-CM | POA: Diagnosis not present

## 2017-12-04 DIAGNOSIS — M6281 Muscle weakness (generalized): Secondary | ICD-10-CM | POA: Diagnosis not present

## 2017-12-04 DIAGNOSIS — I10 Essential (primary) hypertension: Secondary | ICD-10-CM | POA: Diagnosis not present

## 2017-12-05 DIAGNOSIS — I2510 Atherosclerotic heart disease of native coronary artery without angina pectoris: Secondary | ICD-10-CM | POA: Diagnosis not present

## 2017-12-05 DIAGNOSIS — N390 Urinary tract infection, site not specified: Secondary | ICD-10-CM | POA: Diagnosis not present

## 2017-12-05 DIAGNOSIS — Z8673 Personal history of transient ischemic attack (TIA), and cerebral infarction without residual deficits: Secondary | ICD-10-CM | POA: Diagnosis not present

## 2017-12-05 DIAGNOSIS — I10 Essential (primary) hypertension: Secondary | ICD-10-CM | POA: Diagnosis not present

## 2017-12-05 DIAGNOSIS — R69 Illness, unspecified: Secondary | ICD-10-CM | POA: Diagnosis not present

## 2017-12-05 DIAGNOSIS — E119 Type 2 diabetes mellitus without complications: Secondary | ICD-10-CM | POA: Diagnosis not present

## 2017-12-05 DIAGNOSIS — Z951 Presence of aortocoronary bypass graft: Secondary | ICD-10-CM | POA: Diagnosis not present

## 2017-12-05 DIAGNOSIS — I499 Cardiac arrhythmia, unspecified: Secondary | ICD-10-CM | POA: Diagnosis not present

## 2017-12-05 DIAGNOSIS — R451 Restlessness and agitation: Secondary | ICD-10-CM | POA: Diagnosis not present

## 2017-12-05 DIAGNOSIS — R410 Disorientation, unspecified: Secondary | ICD-10-CM | POA: Diagnosis not present

## 2017-12-05 DIAGNOSIS — Z743 Need for continuous supervision: Secondary | ICD-10-CM | POA: Diagnosis not present

## 2017-12-05 DIAGNOSIS — E785 Hyperlipidemia, unspecified: Secondary | ICD-10-CM | POA: Diagnosis not present

## 2017-12-05 DIAGNOSIS — R404 Transient alteration of awareness: Secondary | ICD-10-CM | POA: Diagnosis not present

## 2017-12-05 DIAGNOSIS — Z87891 Personal history of nicotine dependence: Secondary | ICD-10-CM | POA: Diagnosis not present

## 2017-12-05 DIAGNOSIS — K219 Gastro-esophageal reflux disease without esophagitis: Secondary | ICD-10-CM | POA: Diagnosis not present

## 2017-12-11 DIAGNOSIS — M86172 Other acute osteomyelitis, left ankle and foot: Secondary | ICD-10-CM | POA: Diagnosis not present

## 2017-12-11 DIAGNOSIS — M7732 Calcaneal spur, left foot: Secondary | ICD-10-CM | POA: Diagnosis not present

## 2017-12-11 DIAGNOSIS — I110 Hypertensive heart disease with heart failure: Secondary | ICD-10-CM | POA: Diagnosis not present

## 2017-12-11 DIAGNOSIS — R001 Bradycardia, unspecified: Secondary | ICD-10-CM | POA: Diagnosis not present

## 2017-12-11 DIAGNOSIS — F66 Other sexual disorders: Secondary | ICD-10-CM | POA: Diagnosis not present

## 2017-12-11 DIAGNOSIS — L97429 Non-pressure chronic ulcer of left heel and midfoot with unspecified severity: Secondary | ICD-10-CM | POA: Diagnosis not present

## 2017-12-11 DIAGNOSIS — E11628 Type 2 diabetes mellitus with other skin complications: Secondary | ICD-10-CM | POA: Diagnosis not present

## 2017-12-11 DIAGNOSIS — R609 Edema, unspecified: Secondary | ICD-10-CM | POA: Diagnosis not present

## 2017-12-11 DIAGNOSIS — R69 Illness, unspecified: Secondary | ICD-10-CM | POA: Diagnosis not present

## 2017-12-11 DIAGNOSIS — G9341 Metabolic encephalopathy: Secondary | ICD-10-CM | POA: Diagnosis not present

## 2017-12-11 DIAGNOSIS — M7989 Other specified soft tissue disorders: Secondary | ICD-10-CM | POA: Diagnosis not present

## 2017-12-11 DIAGNOSIS — E1142 Type 2 diabetes mellitus with diabetic polyneuropathy: Secondary | ICD-10-CM | POA: Diagnosis not present

## 2017-12-11 DIAGNOSIS — E11621 Type 2 diabetes mellitus with foot ulcer: Secondary | ICD-10-CM | POA: Diagnosis not present

## 2017-12-11 DIAGNOSIS — M86072 Acute hematogenous osteomyelitis, left ankle and foot: Secondary | ICD-10-CM | POA: Diagnosis not present

## 2017-12-11 DIAGNOSIS — L03116 Cellulitis of left lower limb: Secondary | ICD-10-CM | POA: Diagnosis not present

## 2017-12-11 DIAGNOSIS — I2510 Atherosclerotic heart disease of native coronary artery without angina pectoris: Secondary | ICD-10-CM | POA: Diagnosis not present

## 2017-12-11 DIAGNOSIS — E1169 Type 2 diabetes mellitus with other specified complication: Secondary | ICD-10-CM | POA: Diagnosis not present

## 2017-12-11 DIAGNOSIS — I5022 Chronic systolic (congestive) heart failure: Secondary | ICD-10-CM | POA: Diagnosis not present

## 2017-12-11 DIAGNOSIS — M6281 Muscle weakness (generalized): Secondary | ICD-10-CM | POA: Diagnosis not present

## 2017-12-11 DIAGNOSIS — R600 Localized edema: Secondary | ICD-10-CM | POA: Diagnosis not present

## 2017-12-11 DIAGNOSIS — E785 Hyperlipidemia, unspecified: Secondary | ICD-10-CM | POA: Diagnosis not present

## 2017-12-11 DIAGNOSIS — R634 Abnormal weight loss: Secondary | ICD-10-CM | POA: Diagnosis not present

## 2017-12-11 DIAGNOSIS — M869 Osteomyelitis, unspecified: Secondary | ICD-10-CM | POA: Diagnosis not present

## 2017-12-11 DIAGNOSIS — I959 Hypotension, unspecified: Secondary | ICD-10-CM | POA: Diagnosis not present

## 2017-12-11 DIAGNOSIS — F0151 Vascular dementia with behavioral disturbance: Secondary | ICD-10-CM | POA: Diagnosis not present

## 2017-12-11 DIAGNOSIS — L0390 Cellulitis, unspecified: Secondary | ICD-10-CM | POA: Diagnosis not present

## 2017-12-11 DIAGNOSIS — L97529 Non-pressure chronic ulcer of other part of left foot with unspecified severity: Secondary | ICD-10-CM | POA: Diagnosis not present

## 2017-12-11 DIAGNOSIS — L03032 Cellulitis of left toe: Secondary | ICD-10-CM | POA: Diagnosis not present

## 2017-12-11 DIAGNOSIS — K219 Gastro-esophageal reflux disease without esophagitis: Secondary | ICD-10-CM | POA: Diagnosis not present

## 2017-12-11 DIAGNOSIS — R52 Pain, unspecified: Secondary | ICD-10-CM | POA: Diagnosis not present

## 2017-12-12 DIAGNOSIS — E11621 Type 2 diabetes mellitus with foot ulcer: Secondary | ICD-10-CM | POA: Diagnosis not present

## 2017-12-12 DIAGNOSIS — I110 Hypertensive heart disease with heart failure: Secondary | ICD-10-CM | POA: Diagnosis not present

## 2017-12-12 DIAGNOSIS — K219 Gastro-esophageal reflux disease without esophagitis: Secondary | ICD-10-CM | POA: Diagnosis not present

## 2017-12-12 DIAGNOSIS — R69 Illness, unspecified: Secondary | ICD-10-CM | POA: Diagnosis not present

## 2017-12-12 DIAGNOSIS — R001 Bradycardia, unspecified: Secondary | ICD-10-CM | POA: Diagnosis not present

## 2017-12-12 DIAGNOSIS — I5022 Chronic systolic (congestive) heart failure: Secondary | ICD-10-CM | POA: Diagnosis not present

## 2017-12-12 DIAGNOSIS — E785 Hyperlipidemia, unspecified: Secondary | ICD-10-CM | POA: Diagnosis not present

## 2017-12-12 DIAGNOSIS — I2510 Atherosclerotic heart disease of native coronary artery without angina pectoris: Secondary | ICD-10-CM | POA: Diagnosis not present

## 2017-12-12 DIAGNOSIS — L97429 Non-pressure chronic ulcer of left heel and midfoot with unspecified severity: Secondary | ICD-10-CM | POA: Diagnosis not present

## 2017-12-13 DIAGNOSIS — I110 Hypertensive heart disease with heart failure: Secondary | ICD-10-CM | POA: Diagnosis not present

## 2017-12-13 DIAGNOSIS — K219 Gastro-esophageal reflux disease without esophagitis: Secondary | ICD-10-CM | POA: Diagnosis not present

## 2017-12-13 DIAGNOSIS — I2510 Atherosclerotic heart disease of native coronary artery without angina pectoris: Secondary | ICD-10-CM | POA: Diagnosis not present

## 2017-12-13 DIAGNOSIS — L97429 Non-pressure chronic ulcer of left heel and midfoot with unspecified severity: Secondary | ICD-10-CM | POA: Diagnosis not present

## 2017-12-13 DIAGNOSIS — R69 Illness, unspecified: Secondary | ICD-10-CM | POA: Diagnosis not present

## 2017-12-13 DIAGNOSIS — I5022 Chronic systolic (congestive) heart failure: Secondary | ICD-10-CM | POA: Diagnosis not present

## 2017-12-13 DIAGNOSIS — E11621 Type 2 diabetes mellitus with foot ulcer: Secondary | ICD-10-CM | POA: Diagnosis not present

## 2017-12-13 DIAGNOSIS — R001 Bradycardia, unspecified: Secondary | ICD-10-CM | POA: Diagnosis not present

## 2017-12-13 DIAGNOSIS — E785 Hyperlipidemia, unspecified: Secondary | ICD-10-CM | POA: Diagnosis not present

## 2017-12-14 DIAGNOSIS — L97429 Non-pressure chronic ulcer of left heel and midfoot with unspecified severity: Secondary | ICD-10-CM | POA: Diagnosis not present

## 2017-12-14 DIAGNOSIS — R69 Illness, unspecified: Secondary | ICD-10-CM | POA: Diagnosis not present

## 2017-12-14 DIAGNOSIS — R600 Localized edema: Secondary | ICD-10-CM | POA: Diagnosis not present

## 2017-12-14 DIAGNOSIS — E785 Hyperlipidemia, unspecified: Secondary | ICD-10-CM | POA: Diagnosis not present

## 2017-12-14 DIAGNOSIS — M7989 Other specified soft tissue disorders: Secondary | ICD-10-CM | POA: Diagnosis not present

## 2017-12-14 DIAGNOSIS — I110 Hypertensive heart disease with heart failure: Secondary | ICD-10-CM | POA: Diagnosis not present

## 2017-12-14 DIAGNOSIS — I5022 Chronic systolic (congestive) heart failure: Secondary | ICD-10-CM | POA: Diagnosis not present

## 2017-12-14 DIAGNOSIS — E1169 Type 2 diabetes mellitus with other specified complication: Secondary | ICD-10-CM | POA: Diagnosis not present

## 2017-12-14 DIAGNOSIS — I2510 Atherosclerotic heart disease of native coronary artery without angina pectoris: Secondary | ICD-10-CM | POA: Diagnosis not present

## 2017-12-14 DIAGNOSIS — E11621 Type 2 diabetes mellitus with foot ulcer: Secondary | ICD-10-CM | POA: Diagnosis not present

## 2017-12-14 DIAGNOSIS — M86172 Other acute osteomyelitis, left ankle and foot: Secondary | ICD-10-CM | POA: Diagnosis not present

## 2017-12-14 DIAGNOSIS — R001 Bradycardia, unspecified: Secondary | ICD-10-CM | POA: Diagnosis not present

## 2017-12-14 DIAGNOSIS — K219 Gastro-esophageal reflux disease without esophagitis: Secondary | ICD-10-CM | POA: Diagnosis not present

## 2017-12-15 DIAGNOSIS — I2510 Atherosclerotic heart disease of native coronary artery without angina pectoris: Secondary | ICD-10-CM | POA: Diagnosis not present

## 2017-12-15 DIAGNOSIS — I110 Hypertensive heart disease with heart failure: Secondary | ICD-10-CM | POA: Diagnosis not present

## 2017-12-15 DIAGNOSIS — K219 Gastro-esophageal reflux disease without esophagitis: Secondary | ICD-10-CM | POA: Diagnosis not present

## 2017-12-15 DIAGNOSIS — I5022 Chronic systolic (congestive) heart failure: Secondary | ICD-10-CM | POA: Diagnosis not present

## 2017-12-15 DIAGNOSIS — M86172 Other acute osteomyelitis, left ankle and foot: Secondary | ICD-10-CM | POA: Diagnosis not present

## 2017-12-15 DIAGNOSIS — E785 Hyperlipidemia, unspecified: Secondary | ICD-10-CM | POA: Diagnosis not present

## 2017-12-15 DIAGNOSIS — R001 Bradycardia, unspecified: Secondary | ICD-10-CM | POA: Diagnosis not present

## 2017-12-15 DIAGNOSIS — E11621 Type 2 diabetes mellitus with foot ulcer: Secondary | ICD-10-CM | POA: Diagnosis not present

## 2017-12-15 DIAGNOSIS — R69 Illness, unspecified: Secondary | ICD-10-CM | POA: Diagnosis not present

## 2017-12-15 DIAGNOSIS — L97429 Non-pressure chronic ulcer of left heel and midfoot with unspecified severity: Secondary | ICD-10-CM | POA: Diagnosis not present

## 2017-12-15 DIAGNOSIS — L97529 Non-pressure chronic ulcer of other part of left foot with unspecified severity: Secondary | ICD-10-CM | POA: Diagnosis not present

## 2017-12-16 DIAGNOSIS — R001 Bradycardia, unspecified: Secondary | ICD-10-CM | POA: Diagnosis not present

## 2017-12-16 DIAGNOSIS — I2510 Atherosclerotic heart disease of native coronary artery without angina pectoris: Secondary | ICD-10-CM | POA: Diagnosis not present

## 2017-12-16 DIAGNOSIS — L97429 Non-pressure chronic ulcer of left heel and midfoot with unspecified severity: Secondary | ICD-10-CM | POA: Diagnosis not present

## 2017-12-16 DIAGNOSIS — I110 Hypertensive heart disease with heart failure: Secondary | ICD-10-CM | POA: Diagnosis not present

## 2017-12-16 DIAGNOSIS — L97529 Non-pressure chronic ulcer of other part of left foot with unspecified severity: Secondary | ICD-10-CM | POA: Diagnosis not present

## 2017-12-16 DIAGNOSIS — K219 Gastro-esophageal reflux disease without esophagitis: Secondary | ICD-10-CM | POA: Diagnosis not present

## 2017-12-16 DIAGNOSIS — E785 Hyperlipidemia, unspecified: Secondary | ICD-10-CM | POA: Diagnosis not present

## 2017-12-16 DIAGNOSIS — E11621 Type 2 diabetes mellitus with foot ulcer: Secondary | ICD-10-CM | POA: Diagnosis not present

## 2017-12-16 DIAGNOSIS — R69 Illness, unspecified: Secondary | ICD-10-CM | POA: Diagnosis not present

## 2017-12-16 DIAGNOSIS — I5022 Chronic systolic (congestive) heart failure: Secondary | ICD-10-CM | POA: Diagnosis not present

## 2017-12-16 DIAGNOSIS — M86172 Other acute osteomyelitis, left ankle and foot: Secondary | ICD-10-CM | POA: Diagnosis not present

## 2017-12-17 DIAGNOSIS — I5022 Chronic systolic (congestive) heart failure: Secondary | ICD-10-CM | POA: Diagnosis not present

## 2017-12-17 DIAGNOSIS — R69 Illness, unspecified: Secondary | ICD-10-CM | POA: Diagnosis not present

## 2017-12-17 DIAGNOSIS — I2510 Atherosclerotic heart disease of native coronary artery without angina pectoris: Secondary | ICD-10-CM | POA: Diagnosis not present

## 2017-12-17 DIAGNOSIS — R001 Bradycardia, unspecified: Secondary | ICD-10-CM | POA: Diagnosis not present

## 2017-12-17 DIAGNOSIS — L97529 Non-pressure chronic ulcer of other part of left foot with unspecified severity: Secondary | ICD-10-CM | POA: Diagnosis not present

## 2017-12-17 DIAGNOSIS — L97429 Non-pressure chronic ulcer of left heel and midfoot with unspecified severity: Secondary | ICD-10-CM | POA: Diagnosis not present

## 2017-12-17 DIAGNOSIS — M86172 Other acute osteomyelitis, left ankle and foot: Secondary | ICD-10-CM | POA: Diagnosis not present

## 2017-12-17 DIAGNOSIS — E11621 Type 2 diabetes mellitus with foot ulcer: Secondary | ICD-10-CM | POA: Diagnosis not present

## 2017-12-17 DIAGNOSIS — M869 Osteomyelitis, unspecified: Secondary | ICD-10-CM | POA: Diagnosis not present

## 2017-12-17 DIAGNOSIS — I110 Hypertensive heart disease with heart failure: Secondary | ICD-10-CM | POA: Diagnosis not present

## 2017-12-17 DIAGNOSIS — E785 Hyperlipidemia, unspecified: Secondary | ICD-10-CM | POA: Diagnosis not present

## 2017-12-17 DIAGNOSIS — K219 Gastro-esophageal reflux disease without esophagitis: Secondary | ICD-10-CM | POA: Diagnosis not present

## 2017-12-17 DIAGNOSIS — M86072 Acute hematogenous osteomyelitis, left ankle and foot: Secondary | ICD-10-CM | POA: Diagnosis not present

## 2017-12-18 DIAGNOSIS — E11621 Type 2 diabetes mellitus with foot ulcer: Secondary | ICD-10-CM | POA: Diagnosis not present

## 2017-12-18 DIAGNOSIS — I5022 Chronic systolic (congestive) heart failure: Secondary | ICD-10-CM | POA: Diagnosis not present

## 2017-12-18 DIAGNOSIS — R001 Bradycardia, unspecified: Secondary | ICD-10-CM | POA: Diagnosis not present

## 2017-12-18 DIAGNOSIS — E785 Hyperlipidemia, unspecified: Secondary | ICD-10-CM | POA: Diagnosis not present

## 2017-12-18 DIAGNOSIS — I2510 Atherosclerotic heart disease of native coronary artery without angina pectoris: Secondary | ICD-10-CM | POA: Diagnosis not present

## 2017-12-18 DIAGNOSIS — R69 Illness, unspecified: Secondary | ICD-10-CM | POA: Diagnosis not present

## 2017-12-18 DIAGNOSIS — I110 Hypertensive heart disease with heart failure: Secondary | ICD-10-CM | POA: Diagnosis not present

## 2017-12-18 DIAGNOSIS — L97429 Non-pressure chronic ulcer of left heel and midfoot with unspecified severity: Secondary | ICD-10-CM | POA: Diagnosis not present

## 2017-12-18 DIAGNOSIS — L97529 Non-pressure chronic ulcer of other part of left foot with unspecified severity: Secondary | ICD-10-CM | POA: Diagnosis not present

## 2017-12-18 DIAGNOSIS — K219 Gastro-esophageal reflux disease without esophagitis: Secondary | ICD-10-CM | POA: Diagnosis not present

## 2017-12-18 DIAGNOSIS — M86172 Other acute osteomyelitis, left ankle and foot: Secondary | ICD-10-CM | POA: Diagnosis not present

## 2017-12-19 DIAGNOSIS — I110 Hypertensive heart disease with heart failure: Secondary | ICD-10-CM | POA: Diagnosis not present

## 2017-12-19 DIAGNOSIS — E11621 Type 2 diabetes mellitus with foot ulcer: Secondary | ICD-10-CM | POA: Diagnosis not present

## 2017-12-19 DIAGNOSIS — L97529 Non-pressure chronic ulcer of other part of left foot with unspecified severity: Secondary | ICD-10-CM | POA: Diagnosis not present

## 2017-12-19 DIAGNOSIS — I2510 Atherosclerotic heart disease of native coronary artery without angina pectoris: Secondary | ICD-10-CM | POA: Diagnosis not present

## 2017-12-19 DIAGNOSIS — R69 Illness, unspecified: Secondary | ICD-10-CM | POA: Diagnosis not present

## 2017-12-19 DIAGNOSIS — E785 Hyperlipidemia, unspecified: Secondary | ICD-10-CM | POA: Diagnosis not present

## 2017-12-19 DIAGNOSIS — M86172 Other acute osteomyelitis, left ankle and foot: Secondary | ICD-10-CM | POA: Diagnosis not present

## 2017-12-19 DIAGNOSIS — K219 Gastro-esophageal reflux disease without esophagitis: Secondary | ICD-10-CM | POA: Diagnosis not present

## 2017-12-19 DIAGNOSIS — L97429 Non-pressure chronic ulcer of left heel and midfoot with unspecified severity: Secondary | ICD-10-CM | POA: Diagnosis not present

## 2017-12-19 DIAGNOSIS — R001 Bradycardia, unspecified: Secondary | ICD-10-CM | POA: Diagnosis not present

## 2017-12-19 DIAGNOSIS — I5022 Chronic systolic (congestive) heart failure: Secondary | ICD-10-CM | POA: Diagnosis not present

## 2017-12-20 DIAGNOSIS — E785 Hyperlipidemia, unspecified: Secondary | ICD-10-CM | POA: Diagnosis not present

## 2017-12-20 DIAGNOSIS — K219 Gastro-esophageal reflux disease without esophagitis: Secondary | ICD-10-CM | POA: Diagnosis not present

## 2017-12-20 DIAGNOSIS — I110 Hypertensive heart disease with heart failure: Secondary | ICD-10-CM | POA: Diagnosis not present

## 2017-12-20 DIAGNOSIS — I5022 Chronic systolic (congestive) heart failure: Secondary | ICD-10-CM | POA: Diagnosis not present

## 2017-12-20 DIAGNOSIS — R69 Illness, unspecified: Secondary | ICD-10-CM | POA: Diagnosis not present

## 2017-12-20 DIAGNOSIS — R001 Bradycardia, unspecified: Secondary | ICD-10-CM | POA: Diagnosis not present

## 2017-12-20 DIAGNOSIS — L97429 Non-pressure chronic ulcer of left heel and midfoot with unspecified severity: Secondary | ICD-10-CM | POA: Diagnosis not present

## 2017-12-20 DIAGNOSIS — E11621 Type 2 diabetes mellitus with foot ulcer: Secondary | ICD-10-CM | POA: Diagnosis not present

## 2017-12-20 DIAGNOSIS — L97529 Non-pressure chronic ulcer of other part of left foot with unspecified severity: Secondary | ICD-10-CM | POA: Diagnosis not present

## 2017-12-20 DIAGNOSIS — I2510 Atherosclerotic heart disease of native coronary artery without angina pectoris: Secondary | ICD-10-CM | POA: Diagnosis not present

## 2017-12-21 DIAGNOSIS — E11621 Type 2 diabetes mellitus with foot ulcer: Secondary | ICD-10-CM | POA: Diagnosis not present

## 2017-12-21 DIAGNOSIS — I5022 Chronic systolic (congestive) heart failure: Secondary | ICD-10-CM | POA: Diagnosis not present

## 2017-12-21 DIAGNOSIS — L97429 Non-pressure chronic ulcer of left heel and midfoot with unspecified severity: Secondary | ICD-10-CM | POA: Diagnosis not present

## 2017-12-21 DIAGNOSIS — I2510 Atherosclerotic heart disease of native coronary artery without angina pectoris: Secondary | ICD-10-CM | POA: Diagnosis not present

## 2017-12-21 DIAGNOSIS — R69 Illness, unspecified: Secondary | ICD-10-CM | POA: Diagnosis not present

## 2017-12-21 DIAGNOSIS — I110 Hypertensive heart disease with heart failure: Secondary | ICD-10-CM | POA: Diagnosis not present

## 2017-12-21 DIAGNOSIS — L97529 Non-pressure chronic ulcer of other part of left foot with unspecified severity: Secondary | ICD-10-CM | POA: Diagnosis not present

## 2017-12-21 DIAGNOSIS — E785 Hyperlipidemia, unspecified: Secondary | ICD-10-CM | POA: Diagnosis not present

## 2017-12-21 DIAGNOSIS — R001 Bradycardia, unspecified: Secondary | ICD-10-CM | POA: Diagnosis not present

## 2017-12-21 DIAGNOSIS — K219 Gastro-esophageal reflux disease without esophagitis: Secondary | ICD-10-CM | POA: Diagnosis not present

## 2017-12-22 DIAGNOSIS — I110 Hypertensive heart disease with heart failure: Secondary | ICD-10-CM | POA: Diagnosis not present

## 2017-12-22 DIAGNOSIS — K219 Gastro-esophageal reflux disease without esophagitis: Secondary | ICD-10-CM | POA: Diagnosis not present

## 2017-12-22 DIAGNOSIS — L97429 Non-pressure chronic ulcer of left heel and midfoot with unspecified severity: Secondary | ICD-10-CM | POA: Diagnosis not present

## 2017-12-22 DIAGNOSIS — I2510 Atherosclerotic heart disease of native coronary artery without angina pectoris: Secondary | ICD-10-CM | POA: Diagnosis not present

## 2017-12-22 DIAGNOSIS — E785 Hyperlipidemia, unspecified: Secondary | ICD-10-CM | POA: Diagnosis not present

## 2017-12-22 DIAGNOSIS — E11621 Type 2 diabetes mellitus with foot ulcer: Secondary | ICD-10-CM | POA: Diagnosis not present

## 2017-12-22 DIAGNOSIS — I5022 Chronic systolic (congestive) heart failure: Secondary | ICD-10-CM | POA: Diagnosis not present

## 2017-12-22 DIAGNOSIS — R001 Bradycardia, unspecified: Secondary | ICD-10-CM | POA: Diagnosis not present

## 2017-12-22 DIAGNOSIS — L97529 Non-pressure chronic ulcer of other part of left foot with unspecified severity: Secondary | ICD-10-CM | POA: Diagnosis not present

## 2017-12-22 DIAGNOSIS — R69 Illness, unspecified: Secondary | ICD-10-CM | POA: Diagnosis not present

## 2017-12-23 DIAGNOSIS — R001 Bradycardia, unspecified: Secondary | ICD-10-CM | POA: Diagnosis not present

## 2017-12-23 DIAGNOSIS — R69 Illness, unspecified: Secondary | ICD-10-CM | POA: Diagnosis not present

## 2017-12-23 DIAGNOSIS — E11621 Type 2 diabetes mellitus with foot ulcer: Secondary | ICD-10-CM | POA: Diagnosis not present

## 2017-12-23 DIAGNOSIS — I110 Hypertensive heart disease with heart failure: Secondary | ICD-10-CM | POA: Diagnosis not present

## 2017-12-23 DIAGNOSIS — K219 Gastro-esophageal reflux disease without esophagitis: Secondary | ICD-10-CM | POA: Diagnosis not present

## 2017-12-23 DIAGNOSIS — L97529 Non-pressure chronic ulcer of other part of left foot with unspecified severity: Secondary | ICD-10-CM | POA: Diagnosis not present

## 2017-12-23 DIAGNOSIS — L97429 Non-pressure chronic ulcer of left heel and midfoot with unspecified severity: Secondary | ICD-10-CM | POA: Diagnosis not present

## 2017-12-23 DIAGNOSIS — E785 Hyperlipidemia, unspecified: Secondary | ICD-10-CM | POA: Diagnosis not present

## 2017-12-23 DIAGNOSIS — I5022 Chronic systolic (congestive) heart failure: Secondary | ICD-10-CM | POA: Diagnosis not present

## 2017-12-23 DIAGNOSIS — I2510 Atherosclerotic heart disease of native coronary artery without angina pectoris: Secondary | ICD-10-CM | POA: Diagnosis not present

## 2017-12-24 DIAGNOSIS — L97529 Non-pressure chronic ulcer of other part of left foot with unspecified severity: Secondary | ICD-10-CM | POA: Diagnosis not present

## 2017-12-24 DIAGNOSIS — R69 Illness, unspecified: Secondary | ICD-10-CM | POA: Diagnosis not present

## 2017-12-24 DIAGNOSIS — E11621 Type 2 diabetes mellitus with foot ulcer: Secondary | ICD-10-CM | POA: Diagnosis not present

## 2017-12-24 DIAGNOSIS — I110 Hypertensive heart disease with heart failure: Secondary | ICD-10-CM | POA: Diagnosis not present

## 2017-12-24 DIAGNOSIS — I2510 Atherosclerotic heart disease of native coronary artery without angina pectoris: Secondary | ICD-10-CM | POA: Diagnosis not present

## 2017-12-24 DIAGNOSIS — L97429 Non-pressure chronic ulcer of left heel and midfoot with unspecified severity: Secondary | ICD-10-CM | POA: Diagnosis not present

## 2017-12-24 DIAGNOSIS — I5022 Chronic systolic (congestive) heart failure: Secondary | ICD-10-CM | POA: Diagnosis not present

## 2017-12-24 DIAGNOSIS — K219 Gastro-esophageal reflux disease without esophagitis: Secondary | ICD-10-CM | POA: Diagnosis not present

## 2017-12-24 DIAGNOSIS — E785 Hyperlipidemia, unspecified: Secondary | ICD-10-CM | POA: Diagnosis not present

## 2017-12-24 DIAGNOSIS — R001 Bradycardia, unspecified: Secondary | ICD-10-CM | POA: Diagnosis not present

## 2017-12-25 DIAGNOSIS — I5022 Chronic systolic (congestive) heart failure: Secondary | ICD-10-CM | POA: Diagnosis not present

## 2017-12-25 DIAGNOSIS — I2510 Atherosclerotic heart disease of native coronary artery without angina pectoris: Secondary | ICD-10-CM | POA: Diagnosis not present

## 2017-12-25 DIAGNOSIS — L97429 Non-pressure chronic ulcer of left heel and midfoot with unspecified severity: Secondary | ICD-10-CM | POA: Diagnosis not present

## 2017-12-25 DIAGNOSIS — R69 Illness, unspecified: Secondary | ICD-10-CM | POA: Diagnosis not present

## 2017-12-25 DIAGNOSIS — K219 Gastro-esophageal reflux disease without esophagitis: Secondary | ICD-10-CM | POA: Diagnosis not present

## 2017-12-25 DIAGNOSIS — L97529 Non-pressure chronic ulcer of other part of left foot with unspecified severity: Secondary | ICD-10-CM | POA: Diagnosis not present

## 2017-12-25 DIAGNOSIS — R001 Bradycardia, unspecified: Secondary | ICD-10-CM | POA: Diagnosis not present

## 2017-12-25 DIAGNOSIS — I110 Hypertensive heart disease with heart failure: Secondary | ICD-10-CM | POA: Diagnosis not present

## 2017-12-25 DIAGNOSIS — E785 Hyperlipidemia, unspecified: Secondary | ICD-10-CM | POA: Diagnosis not present

## 2017-12-25 DIAGNOSIS — E11621 Type 2 diabetes mellitus with foot ulcer: Secondary | ICD-10-CM | POA: Diagnosis not present

## 2017-12-26 DIAGNOSIS — R69 Illness, unspecified: Secondary | ICD-10-CM | POA: Diagnosis not present

## 2017-12-26 DIAGNOSIS — E11621 Type 2 diabetes mellitus with foot ulcer: Secondary | ICD-10-CM | POA: Diagnosis not present

## 2017-12-26 DIAGNOSIS — E785 Hyperlipidemia, unspecified: Secondary | ICD-10-CM | POA: Diagnosis not present

## 2017-12-26 DIAGNOSIS — L97529 Non-pressure chronic ulcer of other part of left foot with unspecified severity: Secondary | ICD-10-CM | POA: Diagnosis not present

## 2017-12-26 DIAGNOSIS — R001 Bradycardia, unspecified: Secondary | ICD-10-CM | POA: Diagnosis not present

## 2017-12-26 DIAGNOSIS — L97429 Non-pressure chronic ulcer of left heel and midfoot with unspecified severity: Secondary | ICD-10-CM | POA: Diagnosis not present

## 2017-12-26 DIAGNOSIS — I110 Hypertensive heart disease with heart failure: Secondary | ICD-10-CM | POA: Diagnosis not present

## 2017-12-26 DIAGNOSIS — I5022 Chronic systolic (congestive) heart failure: Secondary | ICD-10-CM | POA: Diagnosis not present

## 2017-12-26 DIAGNOSIS — I2510 Atherosclerotic heart disease of native coronary artery without angina pectoris: Secondary | ICD-10-CM | POA: Diagnosis not present

## 2017-12-26 DIAGNOSIS — K219 Gastro-esophageal reflux disease without esophagitis: Secondary | ICD-10-CM | POA: Diagnosis not present

## 2017-12-27 DIAGNOSIS — I2510 Atherosclerotic heart disease of native coronary artery without angina pectoris: Secondary | ICD-10-CM | POA: Diagnosis not present

## 2017-12-27 DIAGNOSIS — R69 Illness, unspecified: Secondary | ICD-10-CM | POA: Diagnosis not present

## 2017-12-27 DIAGNOSIS — L97429 Non-pressure chronic ulcer of left heel and midfoot with unspecified severity: Secondary | ICD-10-CM | POA: Diagnosis not present

## 2017-12-27 DIAGNOSIS — L97529 Non-pressure chronic ulcer of other part of left foot with unspecified severity: Secondary | ICD-10-CM | POA: Diagnosis not present

## 2017-12-27 DIAGNOSIS — I110 Hypertensive heart disease with heart failure: Secondary | ICD-10-CM | POA: Diagnosis not present

## 2017-12-27 DIAGNOSIS — E11621 Type 2 diabetes mellitus with foot ulcer: Secondary | ICD-10-CM | POA: Diagnosis not present

## 2017-12-27 DIAGNOSIS — K219 Gastro-esophageal reflux disease without esophagitis: Secondary | ICD-10-CM | POA: Diagnosis not present

## 2017-12-27 DIAGNOSIS — I5022 Chronic systolic (congestive) heart failure: Secondary | ICD-10-CM | POA: Diagnosis not present

## 2017-12-27 DIAGNOSIS — E785 Hyperlipidemia, unspecified: Secondary | ICD-10-CM | POA: Diagnosis not present

## 2017-12-27 DIAGNOSIS — R001 Bradycardia, unspecified: Secondary | ICD-10-CM | POA: Diagnosis not present

## 2017-12-28 DIAGNOSIS — I2510 Atherosclerotic heart disease of native coronary artery without angina pectoris: Secondary | ICD-10-CM | POA: Diagnosis not present

## 2017-12-28 DIAGNOSIS — E11621 Type 2 diabetes mellitus with foot ulcer: Secondary | ICD-10-CM | POA: Diagnosis not present

## 2017-12-28 DIAGNOSIS — E785 Hyperlipidemia, unspecified: Secondary | ICD-10-CM | POA: Diagnosis not present

## 2017-12-28 DIAGNOSIS — I110 Hypertensive heart disease with heart failure: Secondary | ICD-10-CM | POA: Diagnosis not present

## 2017-12-28 DIAGNOSIS — L97429 Non-pressure chronic ulcer of left heel and midfoot with unspecified severity: Secondary | ICD-10-CM | POA: Diagnosis not present

## 2017-12-28 DIAGNOSIS — I5022 Chronic systolic (congestive) heart failure: Secondary | ICD-10-CM | POA: Diagnosis not present

## 2017-12-28 DIAGNOSIS — R69 Illness, unspecified: Secondary | ICD-10-CM | POA: Diagnosis not present

## 2017-12-28 DIAGNOSIS — L97529 Non-pressure chronic ulcer of other part of left foot with unspecified severity: Secondary | ICD-10-CM | POA: Diagnosis not present

## 2017-12-28 DIAGNOSIS — R001 Bradycardia, unspecified: Secondary | ICD-10-CM | POA: Diagnosis not present

## 2017-12-28 DIAGNOSIS — K219 Gastro-esophageal reflux disease without esophagitis: Secondary | ICD-10-CM | POA: Diagnosis not present

## 2017-12-29 DIAGNOSIS — E785 Hyperlipidemia, unspecified: Secondary | ICD-10-CM | POA: Diagnosis not present

## 2017-12-29 DIAGNOSIS — R69 Illness, unspecified: Secondary | ICD-10-CM | POA: Diagnosis not present

## 2017-12-29 DIAGNOSIS — I110 Hypertensive heart disease with heart failure: Secondary | ICD-10-CM | POA: Diagnosis not present

## 2017-12-29 DIAGNOSIS — I5022 Chronic systolic (congestive) heart failure: Secondary | ICD-10-CM | POA: Diagnosis not present

## 2017-12-29 DIAGNOSIS — E11621 Type 2 diabetes mellitus with foot ulcer: Secondary | ICD-10-CM | POA: Diagnosis not present

## 2017-12-29 DIAGNOSIS — L97429 Non-pressure chronic ulcer of left heel and midfoot with unspecified severity: Secondary | ICD-10-CM | POA: Diagnosis not present

## 2017-12-29 DIAGNOSIS — R001 Bradycardia, unspecified: Secondary | ICD-10-CM | POA: Diagnosis not present

## 2017-12-29 DIAGNOSIS — K219 Gastro-esophageal reflux disease without esophagitis: Secondary | ICD-10-CM | POA: Diagnosis not present

## 2017-12-29 DIAGNOSIS — I2510 Atherosclerotic heart disease of native coronary artery without angina pectoris: Secondary | ICD-10-CM | POA: Diagnosis not present

## 2017-12-29 DIAGNOSIS — L97529 Non-pressure chronic ulcer of other part of left foot with unspecified severity: Secondary | ICD-10-CM | POA: Diagnosis not present

## 2017-12-30 ENCOUNTER — Telehealth: Payer: Self-pay | Admitting: Internal Medicine

## 2017-12-30 DIAGNOSIS — I5022 Chronic systolic (congestive) heart failure: Secondary | ICD-10-CM | POA: Diagnosis not present

## 2017-12-30 DIAGNOSIS — I110 Hypertensive heart disease with heart failure: Secondary | ICD-10-CM | POA: Diagnosis not present

## 2017-12-30 DIAGNOSIS — R69 Illness, unspecified: Secondary | ICD-10-CM | POA: Diagnosis not present

## 2017-12-30 DIAGNOSIS — L97429 Non-pressure chronic ulcer of left heel and midfoot with unspecified severity: Secondary | ICD-10-CM | POA: Diagnosis not present

## 2017-12-30 DIAGNOSIS — L97529 Non-pressure chronic ulcer of other part of left foot with unspecified severity: Secondary | ICD-10-CM | POA: Diagnosis not present

## 2017-12-30 DIAGNOSIS — E785 Hyperlipidemia, unspecified: Secondary | ICD-10-CM | POA: Diagnosis not present

## 2017-12-30 DIAGNOSIS — K219 Gastro-esophageal reflux disease without esophagitis: Secondary | ICD-10-CM | POA: Diagnosis not present

## 2017-12-30 DIAGNOSIS — R001 Bradycardia, unspecified: Secondary | ICD-10-CM | POA: Diagnosis not present

## 2017-12-30 DIAGNOSIS — I2510 Atherosclerotic heart disease of native coronary artery without angina pectoris: Secondary | ICD-10-CM | POA: Diagnosis not present

## 2017-12-30 DIAGNOSIS — E11621 Type 2 diabetes mellitus with foot ulcer: Secondary | ICD-10-CM | POA: Diagnosis not present
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
## 2017-12-31 DIAGNOSIS — K219 Gastro-esophageal reflux disease without esophagitis: Secondary | ICD-10-CM | POA: Diagnosis not present

## 2017-12-31 DIAGNOSIS — I5022 Chronic systolic (congestive) heart failure: Secondary | ICD-10-CM | POA: Diagnosis not present

## 2017-12-31 DIAGNOSIS — R001 Bradycardia, unspecified: Secondary | ICD-10-CM | POA: Diagnosis not present

## 2017-12-31 DIAGNOSIS — L97429 Non-pressure chronic ulcer of left heel and midfoot with unspecified severity: Secondary | ICD-10-CM | POA: Diagnosis not present

## 2017-12-31 DIAGNOSIS — R69 Illness, unspecified: Secondary | ICD-10-CM | POA: Diagnosis not present

## 2017-12-31 DIAGNOSIS — I2510 Atherosclerotic heart disease of native coronary artery without angina pectoris: Secondary | ICD-10-CM | POA: Diagnosis not present

## 2017-12-31 DIAGNOSIS — I110 Hypertensive heart disease with heart failure: Secondary | ICD-10-CM | POA: Diagnosis not present

## 2017-12-31 DIAGNOSIS — L97529 Non-pressure chronic ulcer of other part of left foot with unspecified severity: Secondary | ICD-10-CM | POA: Diagnosis not present

## 2017-12-31 DIAGNOSIS — E785 Hyperlipidemia, unspecified: Secondary | ICD-10-CM | POA: Diagnosis not present

## 2017-12-31 DIAGNOSIS — E11621 Type 2 diabetes mellitus with foot ulcer: Secondary | ICD-10-CM | POA: Diagnosis not present

## 2017-12-31 NOTE — Telephone Encounter (Addendum)
Pt sister Lelan Pons contacted and informed that I could find out some information for her regarding Hospice.   Lelan Pons stated that pt has been so difficult to deal with that she is not sure if [facility] will keep him. Lelan Pons and I spoke about Hospice and informed that they may be able to give pt and her some peace.   I contacted Hospice and they stated that they would go to pt while admitted at [facility]. I called Lelan Pons back and left a detailed message informing her of the same and also gave a number so she could call them directly with any specific questions [phone])

## 2018-01-01 DIAGNOSIS — I2510 Atherosclerotic heart disease of native coronary artery without angina pectoris: Secondary | ICD-10-CM | POA: Diagnosis not present

## 2018-01-01 DIAGNOSIS — R69 Illness, unspecified: Secondary | ICD-10-CM | POA: Diagnosis not present

## 2018-01-01 DIAGNOSIS — Z89422 Acquired absence of other left toe(s): Secondary | ICD-10-CM | POA: Diagnosis not present

## 2018-01-01 DIAGNOSIS — R634 Abnormal weight loss: Secondary | ICD-10-CM | POA: Diagnosis not present

## 2018-01-01 DIAGNOSIS — E1142 Type 2 diabetes mellitus with diabetic polyneuropathy: Secondary | ICD-10-CM | POA: Diagnosis not present

## 2018-01-01 DIAGNOSIS — E785 Hyperlipidemia, unspecified: Secondary | ICD-10-CM | POA: Diagnosis not present

## 2018-01-01 DIAGNOSIS — M6281 Muscle weakness (generalized): Secondary | ICD-10-CM | POA: Diagnosis not present

## 2018-01-01 DIAGNOSIS — L03032 Cellulitis of left toe: Secondary | ICD-10-CM | POA: Diagnosis not present

## 2018-01-02 DIAGNOSIS — E1142 Type 2 diabetes mellitus with diabetic polyneuropathy: Secondary | ICD-10-CM | POA: Diagnosis not present

## 2018-01-02 DIAGNOSIS — E785 Hyperlipidemia, unspecified: Secondary | ICD-10-CM | POA: Diagnosis not present

## 2018-01-02 DIAGNOSIS — Z89412 Acquired absence of left great toe: Secondary | ICD-10-CM | POA: Diagnosis not present

## 2018-01-02 DIAGNOSIS — D649 Anemia, unspecified: Secondary | ICD-10-CM | POA: Diagnosis not present

## 2018-01-02 DIAGNOSIS — Z4889 Encounter for other specified surgical aftercare: Secondary | ICD-10-CM | POA: Diagnosis not present

## 2018-01-02 DIAGNOSIS — E039 Hypothyroidism, unspecified: Secondary | ICD-10-CM | POA: Diagnosis not present

## 2018-01-02 DIAGNOSIS — M86172 Other acute osteomyelitis, left ankle and foot: Secondary | ICD-10-CM | POA: Diagnosis not present

## 2018-01-06 DIAGNOSIS — E119 Type 2 diabetes mellitus without complications: Secondary | ICD-10-CM | POA: Diagnosis not present

## 2018-01-06 DIAGNOSIS — Z4781 Encounter for orthopedic aftercare following surgical amputation: Secondary | ICD-10-CM | POA: Diagnosis not present

## 2018-01-06 DIAGNOSIS — I110 Hypertensive heart disease with heart failure: Secondary | ICD-10-CM | POA: Diagnosis not present

## 2018-01-06 DIAGNOSIS — K219 Gastro-esophageal reflux disease without esophagitis: Secondary | ICD-10-CM | POA: Diagnosis not present

## 2018-01-06 DIAGNOSIS — Z8673 Personal history of transient ischemic attack (TIA), and cerebral infarction without residual deficits: Secondary | ICD-10-CM | POA: Diagnosis not present

## 2018-01-06 DIAGNOSIS — I2510 Atherosclerotic heart disease of native coronary artery without angina pectoris: Secondary | ICD-10-CM | POA: Diagnosis not present

## 2018-01-06 DIAGNOSIS — I5022 Chronic systolic (congestive) heart failure: Secondary | ICD-10-CM | POA: Diagnosis not present

## 2018-01-06 DIAGNOSIS — R69 Illness, unspecified: Secondary | ICD-10-CM | POA: Diagnosis not present

## 2018-01-07 DIAGNOSIS — Z4781 Encounter for orthopedic aftercare following surgical amputation: Secondary | ICD-10-CM | POA: Diagnosis not present

## 2018-01-07 DIAGNOSIS — E119 Type 2 diabetes mellitus without complications: Secondary | ICD-10-CM | POA: Diagnosis not present

## 2018-01-07 DIAGNOSIS — Z8673 Personal history of transient ischemic attack (TIA), and cerebral infarction without residual deficits: Secondary | ICD-10-CM | POA: Diagnosis not present

## 2018-01-07 DIAGNOSIS — I110 Hypertensive heart disease with heart failure: Secondary | ICD-10-CM | POA: Diagnosis not present

## 2018-01-07 DIAGNOSIS — F411 Generalized anxiety disorder: Secondary | ICD-10-CM | POA: Diagnosis not present

## 2018-01-07 DIAGNOSIS — I5022 Chronic systolic (congestive) heart failure: Secondary | ICD-10-CM | POA: Diagnosis not present

## 2018-01-07 DIAGNOSIS — I2510 Atherosclerotic heart disease of native coronary artery without angina pectoris: Secondary | ICD-10-CM | POA: Diagnosis not present

## 2018-01-07 DIAGNOSIS — F331 Major depressive disorder, recurrent, moderate: Secondary | ICD-10-CM | POA: Diagnosis not present

## 2018-01-07 DIAGNOSIS — R69 Illness, unspecified: Secondary | ICD-10-CM | POA: Diagnosis not present

## 2018-01-07 DIAGNOSIS — K219 Gastro-esophageal reflux disease without esophagitis: Secondary | ICD-10-CM | POA: Diagnosis not present

## 2018-01-08 DIAGNOSIS — F0151 Vascular dementia with behavioral disturbance: Secondary | ICD-10-CM | POA: Diagnosis not present

## 2018-01-08 DIAGNOSIS — M6281 Muscle weakness (generalized): Secondary | ICD-10-CM | POA: Diagnosis not present

## 2018-01-08 DIAGNOSIS — I2510 Atherosclerotic heart disease of native coronary artery without angina pectoris: Secondary | ICD-10-CM | POA: Diagnosis not present

## 2018-01-08 DIAGNOSIS — E785 Hyperlipidemia, unspecified: Secondary | ICD-10-CM | POA: Diagnosis not present

## 2018-01-08 DIAGNOSIS — R69 Illness, unspecified: Secondary | ICD-10-CM | POA: Diagnosis not present

## 2018-01-08 DIAGNOSIS — E1142 Type 2 diabetes mellitus with diabetic polyneuropathy: Secondary | ICD-10-CM | POA: Diagnosis not present

## 2018-01-08 DIAGNOSIS — I10 Essential (primary) hypertension: Secondary | ICD-10-CM | POA: Diagnosis not present

## 2018-01-08 DIAGNOSIS — R634 Abnormal weight loss: Secondary | ICD-10-CM | POA: Diagnosis not present

## 2018-01-09 DIAGNOSIS — I5022 Chronic systolic (congestive) heart failure: Secondary | ICD-10-CM | POA: Diagnosis not present

## 2018-01-09 DIAGNOSIS — Z8673 Personal history of transient ischemic attack (TIA), and cerebral infarction without residual deficits: Secondary | ICD-10-CM | POA: Diagnosis not present

## 2018-01-09 DIAGNOSIS — Z4781 Encounter for orthopedic aftercare following surgical amputation: Secondary | ICD-10-CM | POA: Diagnosis not present

## 2018-01-09 DIAGNOSIS — I2510 Atherosclerotic heart disease of native coronary artery without angina pectoris: Secondary | ICD-10-CM | POA: Diagnosis not present

## 2018-01-09 DIAGNOSIS — E119 Type 2 diabetes mellitus without complications: Secondary | ICD-10-CM | POA: Diagnosis not present

## 2018-01-09 DIAGNOSIS — K219 Gastro-esophageal reflux disease without esophagitis: Secondary | ICD-10-CM | POA: Diagnosis not present

## 2018-01-09 DIAGNOSIS — R69 Illness, unspecified: Secondary | ICD-10-CM | POA: Diagnosis not present

## 2018-01-09 DIAGNOSIS — I110 Hypertensive heart disease with heart failure: Secondary | ICD-10-CM | POA: Diagnosis not present

## 2018-01-14 DIAGNOSIS — R69 Illness, unspecified: Secondary | ICD-10-CM | POA: Diagnosis not present

## 2018-01-14 DIAGNOSIS — I2510 Atherosclerotic heart disease of native coronary artery without angina pectoris: Secondary | ICD-10-CM | POA: Diagnosis not present

## 2018-01-14 DIAGNOSIS — K219 Gastro-esophageal reflux disease without esophagitis: Secondary | ICD-10-CM | POA: Diagnosis not present

## 2018-01-14 DIAGNOSIS — I5022 Chronic systolic (congestive) heart failure: Secondary | ICD-10-CM | POA: Diagnosis not present

## 2018-01-14 DIAGNOSIS — Z8673 Personal history of transient ischemic attack (TIA), and cerebral infarction without residual deficits: Secondary | ICD-10-CM | POA: Diagnosis not present

## 2018-01-14 DIAGNOSIS — E119 Type 2 diabetes mellitus without complications: Secondary | ICD-10-CM | POA: Diagnosis not present

## 2018-01-14 DIAGNOSIS — I110 Hypertensive heart disease with heart failure: Secondary | ICD-10-CM | POA: Diagnosis not present

## 2018-01-14 DIAGNOSIS — Z4781 Encounter for orthopedic aftercare following surgical amputation: Secondary | ICD-10-CM | POA: Diagnosis not present

## 2018-01-15 IMAGING — MR MR HEAD W/O CM
10 of 12 series · 41 of 48 positions shown · non-contrast
Comparison: Head CT 04/13/2016

CLINICAL DATA: Confusion.

EXAM:
MRI HEAD WITHOUT CONTRAST
TECHNIQUE: Multiplanar, multiecho pulse sequences of the brain and surrounding
structures were obtained without intravenous contrast.

[Series 3: DWI · axial · 3.0mm · 1.09mm/px · z∈[-62,+76]mm · 5 of 47 slices shown (1 of 6)]
[im 1/47]
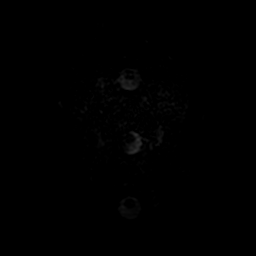
[im 12/47]
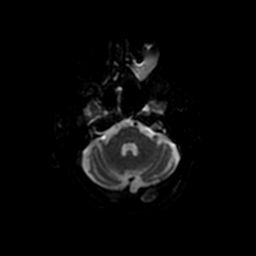
[im 24/47]
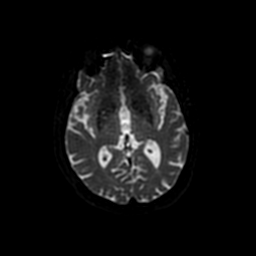
[im 35/47]
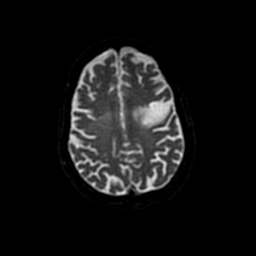
[im 47/47]
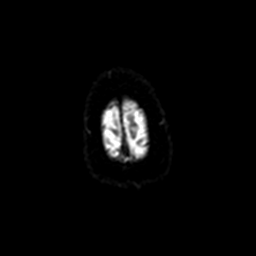

[Series 4: DWI · coronal · 5.0mm · 1.09mm/px · 6 of 64 slices shown (2 of 6)]
[im 1/64]
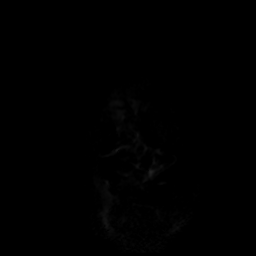
[im 13/64]
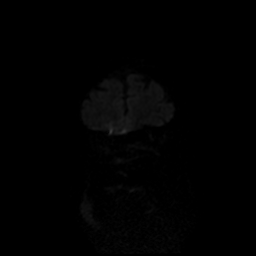
[im 26/64]
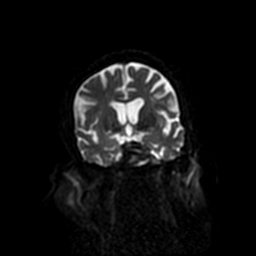
[im 38/64]
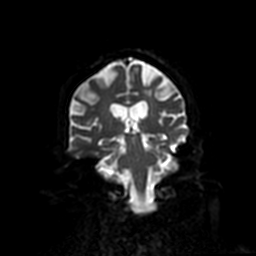
[im 51/64]
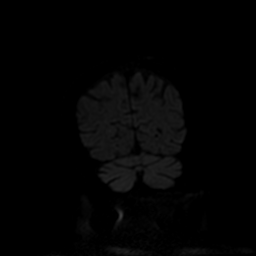
[im 64/64]
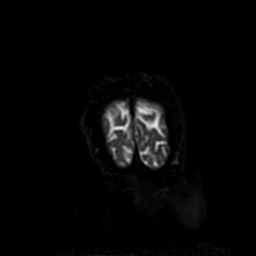

[Series 7: T1 · sagittal · 5.0mm · 0.47mm/px · 2 of 24 slices shown]
[im 1/24]
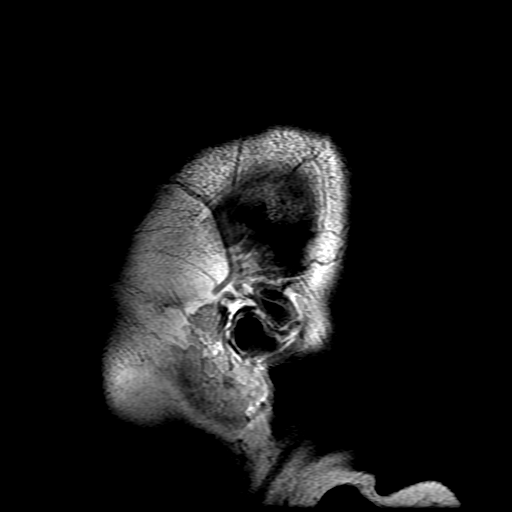
[im 24/24]
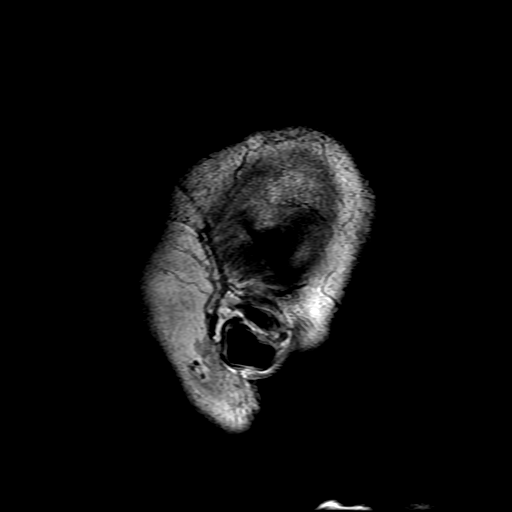

[Series 8: T2 · axial · 5.0mm · 0.43mm/px · z∈[-71,+71]mm · 2 of 23 slices shown (1 of 2)]
[im 1/23]
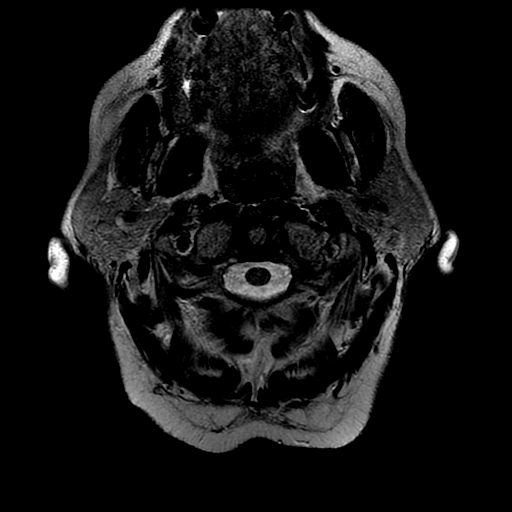
[im 23/23]
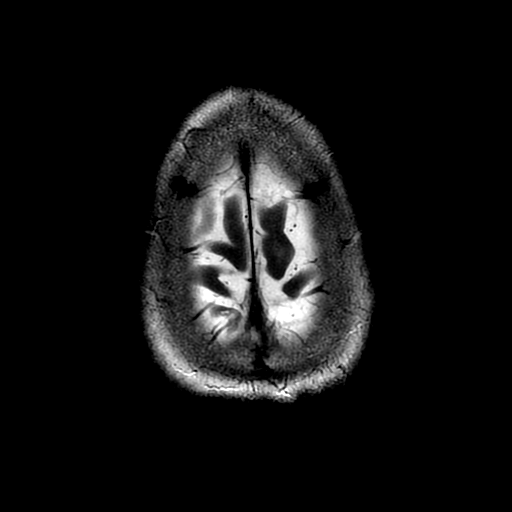

[Series 9: FLAIR · axial · 5.0mm · 0.43mm/px · z∈[-71,+71]mm · 2 of 23 slices shown]
[im 1/23]
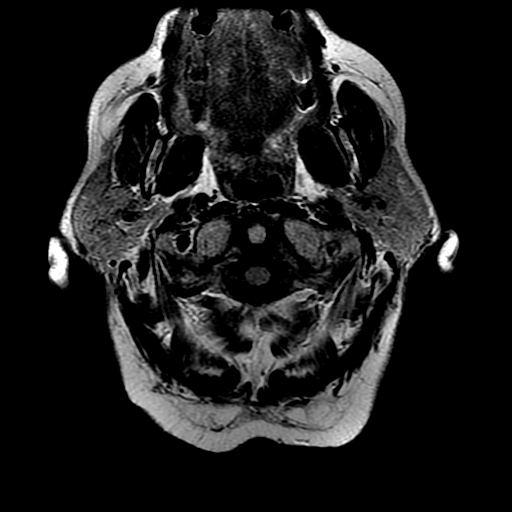
[im 23/23]
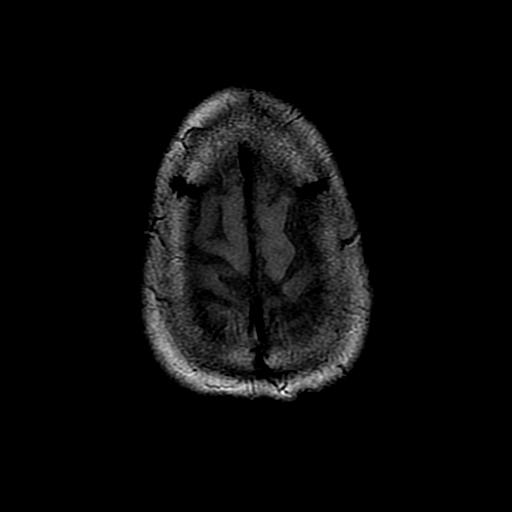

[Series 12: T2 · coronal · 5.0mm · 0.45mm/px · 3 of 28 slices shown (2 of 2)]
[im 1/28]
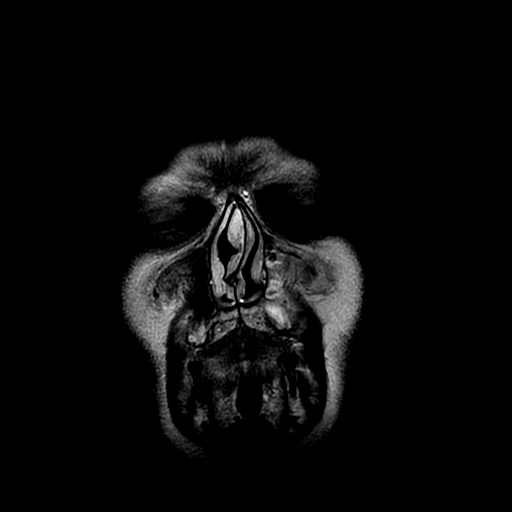
[im 14/28]
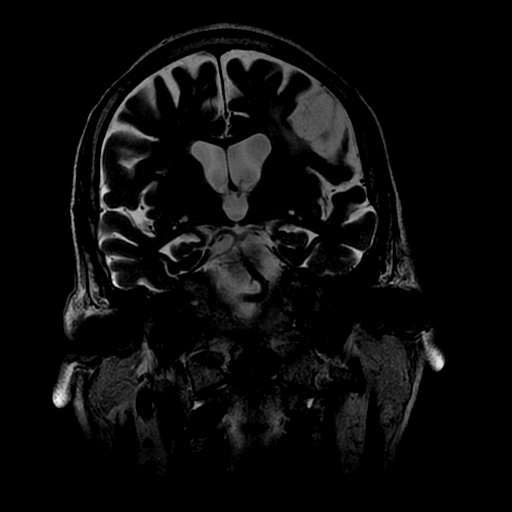
[im 28/28]
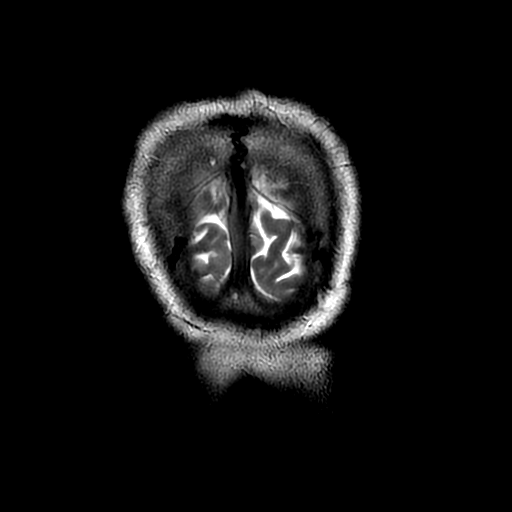

[Series 13: DWI · axial · 3.0mm · 1.09mm/px · z∈[-123,+30]mm · 9 of 104 slices shown (3 of 6)]
[im 1/104]
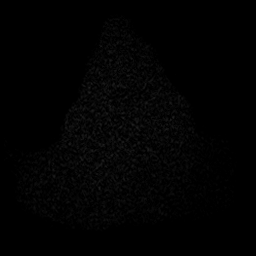
[im 13/104]
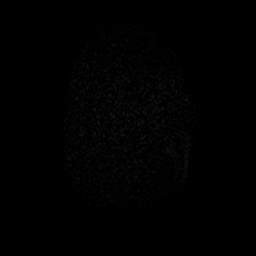
[im 26/104]
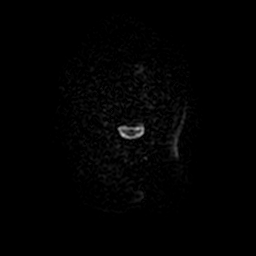
[im 39/104]
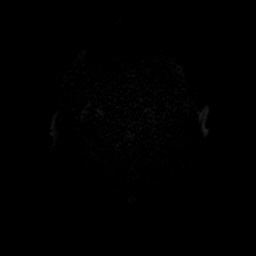
[im 52/104]
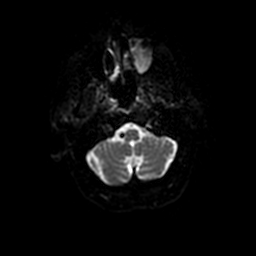
[im 65/104]
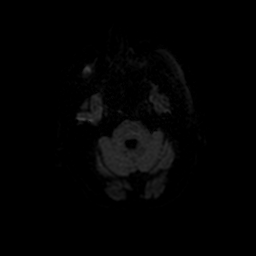
[im 78/104]
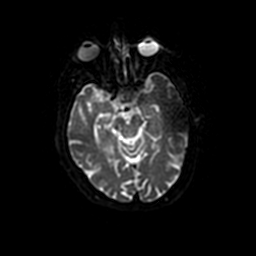
[im 91/104]
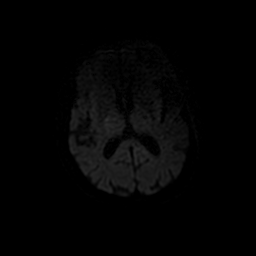
[im 104/104]
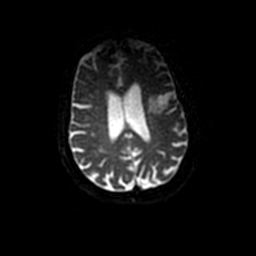

[Series 400: DWI · coronal · 5.0mm · 1.09mm/px · 3 of 32 slices shown (4 of 6)]
[im 1/32]
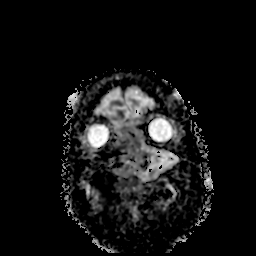
[im 16/32]
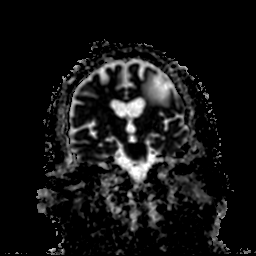
[im 32/32]
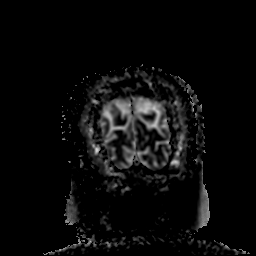

[Series 600: DWI · axial · 3.0mm · 1.09mm/px · z∈[-74,+79]mm · 5 of 52 slices shown (5 of 6)]
[im 1/52]
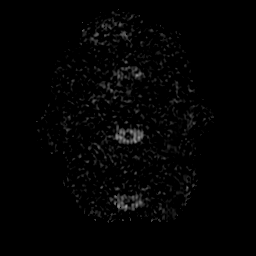
[im 13/52]
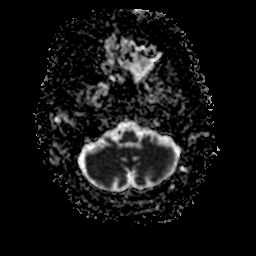
[im 26/52]
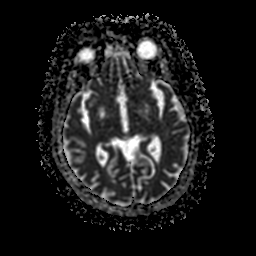
[im 39/52]
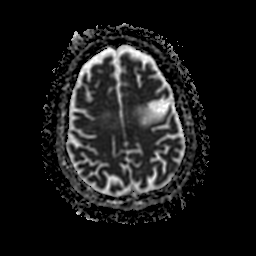
[im 52/52]
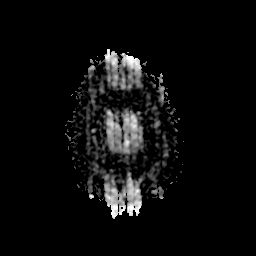

[Series 1300: DWI · axial · 3.0mm · 1.09mm/px · z∈[-123,+30]mm · 4 of 48 slices shown (6 of 6)]
[im 1/48]
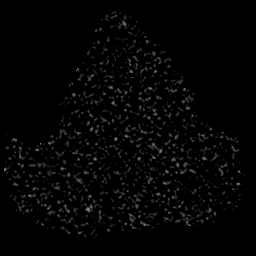
[im 16/48]
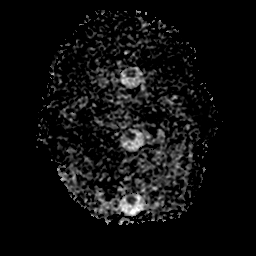
[im 32/48]
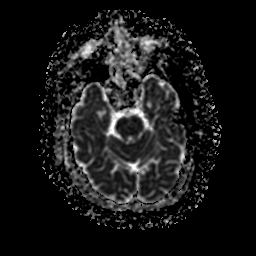
[im 48/48]
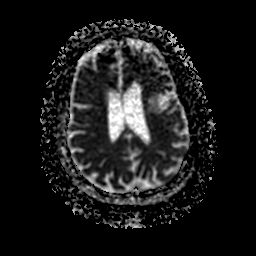

[41 of 48 positions shown; findings below may reference images not displayed]

FINDINGS: Brain: There is no evidence of acute infarct, intracranial
hemorrhage, mass, midline shift, or extra-axial fluid collection. A
chronic left MCA infarct is again seen involving the posterior left
frontal lobe. Mild global cerebral atrophy is within normal limits
for age. Small foci of T2 hyperintensity scattered throughout the
cerebral white matter bilaterally are nonspecific but compatible
with mild chronic small vessel ischemic disease.

Vascular: Major intracranial vascular flow voids are preserved, with
the right vertebral artery being dominant.

Skull and upper cervical spine: No suspicious marrow lesion. Fusion
between the C3-4 vertebral bodies and left posterior elements.

Sinuses/Orbits: Unremarkable orbits. Near complete opacification of
the left maxillary sinus with mucosal thickening and large volume
fluid. Mild-to-moderate mucosal thickening and smaller fluid
elsewhere throughout the sinuses. Trace left mastoid effusion.

Other: None.
IMPRESSION: 1. No acute intracranial abnormality.
2. Mild chronic small vessel ischemic disease.
3. Chronic left frontal lobe infarct.
4. Sinusitis.

## 2018-01-19 DIAGNOSIS — F411 Generalized anxiety disorder: Secondary | ICD-10-CM | POA: Diagnosis not present

## 2018-01-19 DIAGNOSIS — F331 Major depressive disorder, recurrent, moderate: Secondary | ICD-10-CM | POA: Diagnosis not present

## 2018-01-19 DIAGNOSIS — R69 Illness, unspecified: Secondary | ICD-10-CM | POA: Diagnosis not present

## 2018-01-21 DIAGNOSIS — Z4781 Encounter for orthopedic aftercare following surgical amputation: Secondary | ICD-10-CM | POA: Diagnosis not present

## 2018-01-21 DIAGNOSIS — I110 Hypertensive heart disease with heart failure: Secondary | ICD-10-CM | POA: Diagnosis not present

## 2018-01-21 DIAGNOSIS — I5022 Chronic systolic (congestive) heart failure: Secondary | ICD-10-CM | POA: Diagnosis not present

## 2018-01-21 DIAGNOSIS — Z8673 Personal history of transient ischemic attack (TIA), and cerebral infarction without residual deficits: Secondary | ICD-10-CM | POA: Diagnosis not present

## 2018-01-21 DIAGNOSIS — K219 Gastro-esophageal reflux disease without esophagitis: Secondary | ICD-10-CM | POA: Diagnosis not present

## 2018-01-21 DIAGNOSIS — R69 Illness, unspecified: Secondary | ICD-10-CM | POA: Diagnosis not present

## 2018-01-21 DIAGNOSIS — E119 Type 2 diabetes mellitus without complications: Secondary | ICD-10-CM | POA: Diagnosis not present

## 2018-01-21 DIAGNOSIS — I2510 Atherosclerotic heart disease of native coronary artery without angina pectoris: Secondary | ICD-10-CM | POA: Diagnosis not present

## 2018-01-23 DIAGNOSIS — R69 Illness, unspecified: Secondary | ICD-10-CM | POA: Diagnosis not present

## 2018-01-23 DIAGNOSIS — Z4781 Encounter for orthopedic aftercare following surgical amputation: Secondary | ICD-10-CM | POA: Diagnosis not present

## 2018-01-23 DIAGNOSIS — Z8673 Personal history of transient ischemic attack (TIA), and cerebral infarction without residual deficits: Secondary | ICD-10-CM | POA: Diagnosis not present

## 2018-01-23 DIAGNOSIS — I5022 Chronic systolic (congestive) heart failure: Secondary | ICD-10-CM | POA: Diagnosis not present

## 2018-01-23 DIAGNOSIS — I110 Hypertensive heart disease with heart failure: Secondary | ICD-10-CM | POA: Diagnosis not present

## 2018-01-23 DIAGNOSIS — I2510 Atherosclerotic heart disease of native coronary artery without angina pectoris: Secondary | ICD-10-CM | POA: Diagnosis not present

## 2018-01-23 DIAGNOSIS — E119 Type 2 diabetes mellitus without complications: Secondary | ICD-10-CM | POA: Diagnosis not present

## 2018-01-23 DIAGNOSIS — K219 Gastro-esophageal reflux disease without esophagitis: Secondary | ICD-10-CM | POA: Diagnosis not present

## 2018-01-26 DIAGNOSIS — K219 Gastro-esophageal reflux disease without esophagitis: Secondary | ICD-10-CM | POA: Diagnosis not present

## 2018-01-26 DIAGNOSIS — Z4781 Encounter for orthopedic aftercare following surgical amputation: Secondary | ICD-10-CM | POA: Diagnosis not present

## 2018-01-26 DIAGNOSIS — I2510 Atherosclerotic heart disease of native coronary artery without angina pectoris: Secondary | ICD-10-CM | POA: Diagnosis not present

## 2018-01-26 DIAGNOSIS — I5022 Chronic systolic (congestive) heart failure: Secondary | ICD-10-CM | POA: Diagnosis not present

## 2018-01-26 DIAGNOSIS — F331 Major depressive disorder, recurrent, moderate: Secondary | ICD-10-CM | POA: Diagnosis not present

## 2018-01-26 DIAGNOSIS — E119 Type 2 diabetes mellitus without complications: Secondary | ICD-10-CM | POA: Diagnosis not present

## 2018-01-26 DIAGNOSIS — Z8673 Personal history of transient ischemic attack (TIA), and cerebral infarction without residual deficits: Secondary | ICD-10-CM | POA: Diagnosis not present

## 2018-01-26 DIAGNOSIS — F411 Generalized anxiety disorder: Secondary | ICD-10-CM | POA: Diagnosis not present

## 2018-01-26 DIAGNOSIS — R69 Illness, unspecified: Secondary | ICD-10-CM | POA: Diagnosis not present

## 2018-01-26 DIAGNOSIS — I110 Hypertensive heart disease with heart failure: Secondary | ICD-10-CM | POA: Diagnosis not present

## 2018-01-27 DIAGNOSIS — K219 Gastro-esophageal reflux disease without esophagitis: Secondary | ICD-10-CM | POA: Diagnosis not present

## 2018-01-27 DIAGNOSIS — I110 Hypertensive heart disease with heart failure: Secondary | ICD-10-CM | POA: Diagnosis not present

## 2018-01-27 DIAGNOSIS — Z4781 Encounter for orthopedic aftercare following surgical amputation: Secondary | ICD-10-CM | POA: Diagnosis not present

## 2018-01-27 DIAGNOSIS — I5022 Chronic systolic (congestive) heart failure: Secondary | ICD-10-CM | POA: Diagnosis not present

## 2018-01-27 DIAGNOSIS — I2510 Atherosclerotic heart disease of native coronary artery without angina pectoris: Secondary | ICD-10-CM | POA: Diagnosis not present

## 2018-01-27 DIAGNOSIS — Z8673 Personal history of transient ischemic attack (TIA), and cerebral infarction without residual deficits: Secondary | ICD-10-CM | POA: Diagnosis not present

## 2018-01-27 DIAGNOSIS — R69 Illness, unspecified: Secondary | ICD-10-CM | POA: Diagnosis not present

## 2018-01-27 DIAGNOSIS — E119 Type 2 diabetes mellitus without complications: Secondary | ICD-10-CM | POA: Diagnosis not present

## 2018-01-29 DIAGNOSIS — Z4781 Encounter for orthopedic aftercare following surgical amputation: Secondary | ICD-10-CM | POA: Diagnosis not present

## 2018-01-29 DIAGNOSIS — I2510 Atherosclerotic heart disease of native coronary artery without angina pectoris: Secondary | ICD-10-CM | POA: Diagnosis not present

## 2018-01-29 DIAGNOSIS — K219 Gastro-esophageal reflux disease without esophagitis: Secondary | ICD-10-CM | POA: Diagnosis not present

## 2018-01-29 DIAGNOSIS — R69 Illness, unspecified: Secondary | ICD-10-CM | POA: Diagnosis not present

## 2018-01-29 DIAGNOSIS — Z8673 Personal history of transient ischemic attack (TIA), and cerebral infarction without residual deficits: Secondary | ICD-10-CM | POA: Diagnosis not present

## 2018-01-29 DIAGNOSIS — I5022 Chronic systolic (congestive) heart failure: Secondary | ICD-10-CM | POA: Diagnosis not present

## 2018-01-29 DIAGNOSIS — E119 Type 2 diabetes mellitus without complications: Secondary | ICD-10-CM | POA: Diagnosis not present

## 2018-01-29 DIAGNOSIS — I110 Hypertensive heart disease with heart failure: Secondary | ICD-10-CM | POA: Diagnosis not present

## 2018-01-30 DIAGNOSIS — R600 Localized edema: Secondary | ICD-10-CM | POA: Diagnosis not present

## 2018-01-30 DIAGNOSIS — S199XXA Unspecified injury of neck, initial encounter: Secondary | ICD-10-CM | POA: Diagnosis not present

## 2018-01-30 DIAGNOSIS — R69 Illness, unspecified: Secondary | ICD-10-CM | POA: Diagnosis not present

## 2018-01-30 DIAGNOSIS — I771 Stricture of artery: Secondary | ICD-10-CM | POA: Diagnosis not present

## 2018-01-30 DIAGNOSIS — B351 Tinea unguium: Secondary | ICD-10-CM | POA: Diagnosis not present

## 2018-01-30 DIAGNOSIS — M79671 Pain in right foot: Secondary | ICD-10-CM | POA: Diagnosis not present

## 2018-01-30 DIAGNOSIS — I10 Essential (primary) hypertension: Secondary | ICD-10-CM | POA: Diagnosis not present

## 2018-01-30 DIAGNOSIS — R404 Transient alteration of awareness: Secondary | ICD-10-CM | POA: Diagnosis not present

## 2018-01-30 DIAGNOSIS — S0990XA Unspecified injury of head, initial encounter: Secondary | ICD-10-CM | POA: Diagnosis not present

## 2018-01-30 DIAGNOSIS — Z794 Long term (current) use of insulin: Secondary | ICD-10-CM | POA: Diagnosis not present

## 2018-01-30 DIAGNOSIS — Z043 Encounter for examination and observation following other accident: Secondary | ICD-10-CM | POA: Diagnosis not present

## 2018-01-30 DIAGNOSIS — W19XXXA Unspecified fall, initial encounter: Secondary | ICD-10-CM | POA: Diagnosis not present

## 2018-01-30 DIAGNOSIS — M79672 Pain in left foot: Secondary | ICD-10-CM | POA: Diagnosis not present

## 2018-01-30 DIAGNOSIS — Z87891 Personal history of nicotine dependence: Secondary | ICD-10-CM | POA: Diagnosis not present

## 2018-01-30 DIAGNOSIS — E119 Type 2 diabetes mellitus without complications: Secondary | ICD-10-CM | POA: Diagnosis not present

## 2018-01-30 DIAGNOSIS — R410 Disorientation, unspecified: Secondary | ICD-10-CM | POA: Diagnosis not present

## 2018-01-30 DIAGNOSIS — E11621 Type 2 diabetes mellitus with foot ulcer: Secondary | ICD-10-CM | POA: Diagnosis not present

## 2018-01-30 DIAGNOSIS — R4182 Altered mental status, unspecified: Secondary | ICD-10-CM | POA: Diagnosis not present

## 2018-01-30 DIAGNOSIS — Z743 Need for continuous supervision: Secondary | ICD-10-CM | POA: Diagnosis not present

## 2018-01-30 DIAGNOSIS — I739 Peripheral vascular disease, unspecified: Secondary | ICD-10-CM | POA: Diagnosis not present

## 2018-01-30 DIAGNOSIS — E1142 Type 2 diabetes mellitus with diabetic polyneuropathy: Secondary | ICD-10-CM | POA: Diagnosis not present

## 2018-01-30 DIAGNOSIS — I959 Hypotension, unspecified: Secondary | ICD-10-CM | POA: Diagnosis not present

## 2018-01-30 DIAGNOSIS — S299XXA Unspecified injury of thorax, initial encounter: Secondary | ICD-10-CM | POA: Diagnosis not present

## 2018-01-30 DIAGNOSIS — J439 Emphysema, unspecified: Secondary | ICD-10-CM | POA: Diagnosis not present

## 2018-02-02 DIAGNOSIS — I2510 Atherosclerotic heart disease of native coronary artery without angina pectoris: Secondary | ICD-10-CM | POA: Diagnosis not present

## 2018-02-02 DIAGNOSIS — Z4781 Encounter for orthopedic aftercare following surgical amputation: Secondary | ICD-10-CM | POA: Diagnosis not present

## 2018-02-02 DIAGNOSIS — K219 Gastro-esophageal reflux disease without esophagitis: Secondary | ICD-10-CM | POA: Diagnosis not present

## 2018-02-02 DIAGNOSIS — E119 Type 2 diabetes mellitus without complications: Secondary | ICD-10-CM | POA: Diagnosis not present

## 2018-02-02 DIAGNOSIS — Z8673 Personal history of transient ischemic attack (TIA), and cerebral infarction without residual deficits: Secondary | ICD-10-CM | POA: Diagnosis not present

## 2018-02-02 DIAGNOSIS — I5022 Chronic systolic (congestive) heart failure: Secondary | ICD-10-CM | POA: Diagnosis not present

## 2018-02-02 DIAGNOSIS — R69 Illness, unspecified: Secondary | ICD-10-CM | POA: Diagnosis not present

## 2018-02-02 DIAGNOSIS — I110 Hypertensive heart disease with heart failure: Secondary | ICD-10-CM | POA: Diagnosis not present

## 2018-02-04 DIAGNOSIS — K219 Gastro-esophageal reflux disease without esophagitis: Secondary | ICD-10-CM | POA: Diagnosis not present

## 2018-02-04 DIAGNOSIS — R69 Illness, unspecified: Secondary | ICD-10-CM | POA: Diagnosis not present

## 2018-02-04 DIAGNOSIS — Z4781 Encounter for orthopedic aftercare following surgical amputation: Secondary | ICD-10-CM | POA: Diagnosis not present

## 2018-02-04 DIAGNOSIS — I2510 Atherosclerotic heart disease of native coronary artery without angina pectoris: Secondary | ICD-10-CM | POA: Diagnosis not present

## 2018-02-04 DIAGNOSIS — I110 Hypertensive heart disease with heart failure: Secondary | ICD-10-CM | POA: Diagnosis not present

## 2018-02-04 DIAGNOSIS — E119 Type 2 diabetes mellitus without complications: Secondary | ICD-10-CM | POA: Diagnosis not present

## 2018-02-04 DIAGNOSIS — Z8673 Personal history of transient ischemic attack (TIA), and cerebral infarction without residual deficits: Secondary | ICD-10-CM | POA: Diagnosis not present

## 2018-02-04 DIAGNOSIS — I5022 Chronic systolic (congestive) heart failure: Secondary | ICD-10-CM | POA: Diagnosis not present

## 2018-02-04 DIAGNOSIS — M79672 Pain in left foot: Secondary | ICD-10-CM | POA: Diagnosis not present

## 2018-02-05 DIAGNOSIS — I739 Peripheral vascular disease, unspecified: Secondary | ICD-10-CM | POA: Diagnosis not present

## 2018-02-05 DIAGNOSIS — I5022 Chronic systolic (congestive) heart failure: Secondary | ICD-10-CM | POA: Diagnosis not present

## 2018-02-05 DIAGNOSIS — M7731 Calcaneal spur, right foot: Secondary | ICD-10-CM | POA: Diagnosis not present

## 2018-02-05 DIAGNOSIS — E119 Type 2 diabetes mellitus without complications: Secondary | ICD-10-CM | POA: Diagnosis not present

## 2018-02-05 DIAGNOSIS — E1169 Type 2 diabetes mellitus with other specified complication: Secondary | ICD-10-CM | POA: Diagnosis not present

## 2018-02-05 DIAGNOSIS — R609 Edema, unspecified: Secondary | ICD-10-CM | POA: Diagnosis not present

## 2018-02-05 DIAGNOSIS — E11628 Type 2 diabetes mellitus with other skin complications: Secondary | ICD-10-CM | POA: Diagnosis not present

## 2018-02-05 DIAGNOSIS — L089 Local infection of the skin and subcutaneous tissue, unspecified: Secondary | ICD-10-CM | POA: Diagnosis not present

## 2018-02-05 DIAGNOSIS — R7881 Bacteremia: Secondary | ICD-10-CM | POA: Diagnosis not present

## 2018-02-05 DIAGNOSIS — L03115 Cellulitis of right lower limb: Secondary | ICD-10-CM | POA: Diagnosis not present

## 2018-02-05 DIAGNOSIS — Z515 Encounter for palliative care: Secondary | ICD-10-CM | POA: Diagnosis not present

## 2018-02-05 DIAGNOSIS — Z89412 Acquired absence of left great toe: Secondary | ICD-10-CM | POA: Diagnosis not present

## 2018-02-05 DIAGNOSIS — M7989 Other specified soft tissue disorders: Secondary | ICD-10-CM | POA: Diagnosis not present

## 2018-02-05 DIAGNOSIS — Z7401 Bed confinement status: Secondary | ICD-10-CM | POA: Diagnosis not present

## 2018-02-05 DIAGNOSIS — R634 Abnormal weight loss: Secondary | ICD-10-CM | POA: Diagnosis not present

## 2018-02-05 DIAGNOSIS — M86172 Other acute osteomyelitis, left ankle and foot: Secondary | ICD-10-CM | POA: Diagnosis not present

## 2018-02-05 DIAGNOSIS — M6281 Muscle weakness (generalized): Secondary | ICD-10-CM | POA: Diagnosis not present

## 2018-02-05 DIAGNOSIS — L03031 Cellulitis of right toe: Secondary | ICD-10-CM | POA: Diagnosis not present

## 2018-02-05 DIAGNOSIS — M869 Osteomyelitis, unspecified: Secondary | ICD-10-CM | POA: Diagnosis not present

## 2018-02-05 DIAGNOSIS — E11621 Type 2 diabetes mellitus with foot ulcer: Secondary | ICD-10-CM | POA: Diagnosis not present

## 2018-02-05 DIAGNOSIS — R404 Transient alteration of awareness: Secondary | ICD-10-CM | POA: Diagnosis not present

## 2018-02-05 DIAGNOSIS — I2510 Atherosclerotic heart disease of native coronary artery without angina pectoris: Secondary | ICD-10-CM | POA: Diagnosis not present

## 2018-02-05 DIAGNOSIS — M86171 Other acute osteomyelitis, right ankle and foot: Secondary | ICD-10-CM | POA: Diagnosis not present

## 2018-02-05 DIAGNOSIS — R0902 Hypoxemia: Secondary | ICD-10-CM | POA: Diagnosis not present

## 2018-02-05 DIAGNOSIS — E785 Hyperlipidemia, unspecified: Secondary | ICD-10-CM | POA: Diagnosis not present

## 2018-02-05 DIAGNOSIS — R69 Illness, unspecified: Secondary | ICD-10-CM | POA: Diagnosis not present

## 2018-02-05 DIAGNOSIS — I10 Essential (primary) hypertension: Secondary | ICD-10-CM | POA: Diagnosis not present

## 2018-02-05 DIAGNOSIS — E1142 Type 2 diabetes mellitus with diabetic polyneuropathy: Secondary | ICD-10-CM | POA: Diagnosis not present

## 2018-02-05 DIAGNOSIS — I959 Hypotension, unspecified: Secondary | ICD-10-CM | POA: Diagnosis not present

## 2018-02-05 DIAGNOSIS — F0281 Dementia in other diseases classified elsewhere with behavioral disturbance: Secondary | ICD-10-CM | POA: Diagnosis not present

## 2018-02-05 DIAGNOSIS — M19071 Primary osteoarthritis, right ankle and foot: Secondary | ICD-10-CM | POA: Diagnosis not present

## 2018-02-05 DIAGNOSIS — B9562 Methicillin resistant Staphylococcus aureus infection as the cause of diseases classified elsewhere: Secondary | ICD-10-CM | POA: Diagnosis not present

## 2018-02-06 DIAGNOSIS — I10 Essential (primary) hypertension: Secondary | ICD-10-CM | POA: Diagnosis not present

## 2018-02-06 DIAGNOSIS — L089 Local infection of the skin and subcutaneous tissue, unspecified: Secondary | ICD-10-CM | POA: Diagnosis not present

## 2018-02-06 DIAGNOSIS — B9562 Methicillin resistant Staphylococcus aureus infection as the cause of diseases classified elsewhere: Secondary | ICD-10-CM | POA: Diagnosis not present

## 2018-02-06 DIAGNOSIS — M7989 Other specified soft tissue disorders: Secondary | ICD-10-CM | POA: Diagnosis not present

## 2018-02-06 DIAGNOSIS — Z89412 Acquired absence of left great toe: Secondary | ICD-10-CM | POA: Diagnosis not present

## 2018-02-06 DIAGNOSIS — E1169 Type 2 diabetes mellitus with other specified complication: Secondary | ICD-10-CM | POA: Diagnosis not present

## 2018-02-06 DIAGNOSIS — M869 Osteomyelitis, unspecified: Secondary | ICD-10-CM | POA: Diagnosis not present

## 2018-02-06 DIAGNOSIS — E785 Hyperlipidemia, unspecified: Secondary | ICD-10-CM | POA: Diagnosis not present

## 2018-02-06 DIAGNOSIS — L03115 Cellulitis of right lower limb: Secondary | ICD-10-CM | POA: Diagnosis not present

## 2018-02-06 DIAGNOSIS — I739 Peripheral vascular disease, unspecified: Secondary | ICD-10-CM | POA: Diagnosis not present

## 2018-02-06 DIAGNOSIS — R7881 Bacteremia: Secondary | ICD-10-CM | POA: Diagnosis not present

## 2018-02-07 DIAGNOSIS — L089 Local infection of the skin and subcutaneous tissue, unspecified: Secondary | ICD-10-CM | POA: Diagnosis not present

## 2018-02-07 DIAGNOSIS — R7881 Bacteremia: Secondary | ICD-10-CM | POA: Diagnosis not present

## 2018-02-07 DIAGNOSIS — M86171 Other acute osteomyelitis, right ankle and foot: Secondary | ICD-10-CM | POA: Diagnosis not present

## 2018-02-07 DIAGNOSIS — I10 Essential (primary) hypertension: Secondary | ICD-10-CM | POA: Diagnosis not present

## 2018-02-07 DIAGNOSIS — E785 Hyperlipidemia, unspecified: Secondary | ICD-10-CM | POA: Diagnosis not present

## 2018-02-07 DIAGNOSIS — B9562 Methicillin resistant Staphylococcus aureus infection as the cause of diseases classified elsewhere: Secondary | ICD-10-CM | POA: Diagnosis not present

## 2018-02-07 DIAGNOSIS — L03031 Cellulitis of right toe: Secondary | ICD-10-CM | POA: Diagnosis not present

## 2018-02-07 DIAGNOSIS — E1169 Type 2 diabetes mellitus with other specified complication: Secondary | ICD-10-CM | POA: Diagnosis not present

## 2018-02-08 DIAGNOSIS — E1169 Type 2 diabetes mellitus with other specified complication: Secondary | ICD-10-CM | POA: Diagnosis not present

## 2018-02-08 DIAGNOSIS — B9562 Methicillin resistant Staphylococcus aureus infection as the cause of diseases classified elsewhere: Secondary | ICD-10-CM | POA: Diagnosis not present

## 2018-02-08 DIAGNOSIS — R69 Illness, unspecified: Secondary | ICD-10-CM | POA: Diagnosis not present

## 2018-02-08 DIAGNOSIS — E119 Type 2 diabetes mellitus without complications: Secondary | ICD-10-CM | POA: Diagnosis not present

## 2018-02-08 DIAGNOSIS — E785 Hyperlipidemia, unspecified: Secondary | ICD-10-CM | POA: Diagnosis not present

## 2018-02-08 DIAGNOSIS — I2510 Atherosclerotic heart disease of native coronary artery without angina pectoris: Secondary | ICD-10-CM | POA: Diagnosis not present

## 2018-02-08 DIAGNOSIS — M869 Osteomyelitis, unspecified: Secondary | ICD-10-CM | POA: Diagnosis not present

## 2018-02-08 DIAGNOSIS — R7881 Bacteremia: Secondary | ICD-10-CM | POA: Diagnosis not present

## 2018-02-08 DIAGNOSIS — L089 Local infection of the skin and subcutaneous tissue, unspecified: Secondary | ICD-10-CM | POA: Diagnosis not present

## 2018-02-08 DIAGNOSIS — Z89412 Acquired absence of left great toe: Secondary | ICD-10-CM | POA: Diagnosis not present

## 2018-02-08 DIAGNOSIS — I5022 Chronic systolic (congestive) heart failure: Secondary | ICD-10-CM | POA: Diagnosis not present

## 2018-02-08 DIAGNOSIS — I10 Essential (primary) hypertension: Secondary | ICD-10-CM | POA: Diagnosis not present

## 2018-02-08 DIAGNOSIS — L03031 Cellulitis of right toe: Secondary | ICD-10-CM | POA: Diagnosis not present

## 2018-02-08 DIAGNOSIS — M86171 Other acute osteomyelitis, right ankle and foot: Secondary | ICD-10-CM | POA: Diagnosis not present

## 2018-02-09 DIAGNOSIS — E1169 Type 2 diabetes mellitus with other specified complication: Secondary | ICD-10-CM | POA: Diagnosis not present

## 2018-02-09 DIAGNOSIS — E785 Hyperlipidemia, unspecified: Secondary | ICD-10-CM | POA: Diagnosis not present

## 2018-02-09 DIAGNOSIS — R7881 Bacteremia: Secondary | ICD-10-CM | POA: Diagnosis not present

## 2018-02-09 DIAGNOSIS — B9562 Methicillin resistant Staphylococcus aureus infection as the cause of diseases classified elsewhere: Secondary | ICD-10-CM | POA: Diagnosis not present

## 2018-02-09 DIAGNOSIS — L089 Local infection of the skin and subcutaneous tissue, unspecified: Secondary | ICD-10-CM | POA: Diagnosis not present

## 2018-02-09 DIAGNOSIS — I10 Essential (primary) hypertension: Secondary | ICD-10-CM | POA: Diagnosis not present

## 2018-02-10 DIAGNOSIS — I10 Essential (primary) hypertension: Secondary | ICD-10-CM | POA: Diagnosis not present

## 2018-02-10 DIAGNOSIS — E785 Hyperlipidemia, unspecified: Secondary | ICD-10-CM | POA: Diagnosis not present

## 2018-02-10 DIAGNOSIS — B9562 Methicillin resistant Staphylococcus aureus infection as the cause of diseases classified elsewhere: Secondary | ICD-10-CM | POA: Diagnosis not present

## 2018-02-10 DIAGNOSIS — E1169 Type 2 diabetes mellitus with other specified complication: Secondary | ICD-10-CM | POA: Diagnosis not present

## 2018-02-10 DIAGNOSIS — L089 Local infection of the skin and subcutaneous tissue, unspecified: Secondary | ICD-10-CM | POA: Diagnosis not present

## 2018-02-10 DIAGNOSIS — R7881 Bacteremia: Secondary | ICD-10-CM | POA: Diagnosis not present

## 2018-02-11 DIAGNOSIS — I10 Essential (primary) hypertension: Secondary | ICD-10-CM | POA: Diagnosis not present

## 2018-02-11 DIAGNOSIS — E785 Hyperlipidemia, unspecified: Secondary | ICD-10-CM | POA: Diagnosis not present

## 2018-02-11 DIAGNOSIS — B9562 Methicillin resistant Staphylococcus aureus infection as the cause of diseases classified elsewhere: Secondary | ICD-10-CM | POA: Diagnosis not present

## 2018-02-11 DIAGNOSIS — E1169 Type 2 diabetes mellitus with other specified complication: Secondary | ICD-10-CM | POA: Diagnosis not present

## 2018-02-11 DIAGNOSIS — L089 Local infection of the skin and subcutaneous tissue, unspecified: Secondary | ICD-10-CM | POA: Diagnosis not present

## 2018-02-11 DIAGNOSIS — R7881 Bacteremia: Secondary | ICD-10-CM | POA: Diagnosis not present

## 2018-02-12 DIAGNOSIS — I10 Essential (primary) hypertension: Secondary | ICD-10-CM | POA: Diagnosis not present

## 2018-02-12 DIAGNOSIS — E785 Hyperlipidemia, unspecified: Secondary | ICD-10-CM | POA: Diagnosis not present

## 2018-02-12 DIAGNOSIS — L089 Local infection of the skin and subcutaneous tissue, unspecified: Secondary | ICD-10-CM | POA: Diagnosis not present

## 2018-02-12 DIAGNOSIS — E1169 Type 2 diabetes mellitus with other specified complication: Secondary | ICD-10-CM | POA: Diagnosis not present

## 2018-02-12 DIAGNOSIS — B9562 Methicillin resistant Staphylococcus aureus infection as the cause of diseases classified elsewhere: Secondary | ICD-10-CM | POA: Diagnosis not present

## 2018-02-12 DIAGNOSIS — R7881 Bacteremia: Secondary | ICD-10-CM | POA: Diagnosis not present

## 2018-02-13 DIAGNOSIS — B9562 Methicillin resistant Staphylococcus aureus infection as the cause of diseases classified elsewhere: Secondary | ICD-10-CM | POA: Diagnosis not present

## 2018-02-13 DIAGNOSIS — L089 Local infection of the skin and subcutaneous tissue, unspecified: Secondary | ICD-10-CM | POA: Diagnosis not present

## 2018-02-13 DIAGNOSIS — E1169 Type 2 diabetes mellitus with other specified complication: Secondary | ICD-10-CM | POA: Diagnosis not present

## 2018-02-13 DIAGNOSIS — E785 Hyperlipidemia, unspecified: Secondary | ICD-10-CM | POA: Diagnosis not present

## 2018-02-13 DIAGNOSIS — I10 Essential (primary) hypertension: Secondary | ICD-10-CM | POA: Diagnosis not present

## 2018-02-13 DIAGNOSIS — R7881 Bacteremia: Secondary | ICD-10-CM | POA: Diagnosis not present

## 2018-02-14 DIAGNOSIS — L089 Local infection of the skin and subcutaneous tissue, unspecified: Secondary | ICD-10-CM | POA: Diagnosis not present

## 2018-02-14 DIAGNOSIS — E1169 Type 2 diabetes mellitus with other specified complication: Secondary | ICD-10-CM | POA: Diagnosis not present

## 2018-02-14 DIAGNOSIS — B9562 Methicillin resistant Staphylococcus aureus infection as the cause of diseases classified elsewhere: Secondary | ICD-10-CM | POA: Diagnosis not present

## 2018-02-14 DIAGNOSIS — E785 Hyperlipidemia, unspecified: Secondary | ICD-10-CM | POA: Diagnosis not present

## 2018-02-14 DIAGNOSIS — R7881 Bacteremia: Secondary | ICD-10-CM | POA: Diagnosis not present

## 2018-02-14 DIAGNOSIS — I10 Essential (primary) hypertension: Secondary | ICD-10-CM | POA: Diagnosis not present

## 2018-02-15 DIAGNOSIS — L089 Local infection of the skin and subcutaneous tissue, unspecified: Secondary | ICD-10-CM | POA: Diagnosis not present

## 2018-02-15 DIAGNOSIS — Z7401 Bed confinement status: Secondary | ICD-10-CM | POA: Diagnosis not present

## 2018-02-15 DIAGNOSIS — E1169 Type 2 diabetes mellitus with other specified complication: Secondary | ICD-10-CM | POA: Diagnosis not present

## 2018-02-15 DIAGNOSIS — R69 Illness, unspecified: Secondary | ICD-10-CM | POA: Diagnosis not present

## 2018-02-15 DIAGNOSIS — B9562 Methicillin resistant Staphylococcus aureus infection as the cause of diseases classified elsewhere: Secondary | ICD-10-CM | POA: Diagnosis not present

## 2018-02-15 DIAGNOSIS — I10 Essential (primary) hypertension: Secondary | ICD-10-CM | POA: Diagnosis not present

## 2018-02-15 DIAGNOSIS — E785 Hyperlipidemia, unspecified: Secondary | ICD-10-CM | POA: Diagnosis not present

## 2018-02-15 DIAGNOSIS — I959 Hypotension, unspecified: Secondary | ICD-10-CM | POA: Diagnosis not present

## 2018-02-15 DIAGNOSIS — R7881 Bacteremia: Secondary | ICD-10-CM | POA: Diagnosis not present

## 2018-02-15 DIAGNOSIS — R404 Transient alteration of awareness: Secondary | ICD-10-CM | POA: Diagnosis not present

## 2018-02-15 DIAGNOSIS — R0902 Hypoxemia: Secondary | ICD-10-CM | POA: Diagnosis not present

## 2018-02-28 DEATH — deceased
